# Patient Record
Sex: Male | Born: 1955 | Race: White | Hispanic: No | Marital: Single | Smoking: Current every day smoker
Health system: Southern US, Community
[De-identification: ages and names within clinical notes are randomized; demographics above are authoritative.]

## PROBLEM LIST (undated history)

## (undated) DIAGNOSIS — I10 Essential (primary) hypertension: Secondary | ICD-10-CM

## (undated) DIAGNOSIS — E119 Type 2 diabetes mellitus without complications: Secondary | ICD-10-CM

## (undated) DIAGNOSIS — J449 Chronic obstructive pulmonary disease, unspecified: Secondary | ICD-10-CM

## (undated) DIAGNOSIS — M720 Palmar fascial fibromatosis [Dupuytren]: Secondary | ICD-10-CM

## (undated) DIAGNOSIS — F1010 Alcohol abuse, uncomplicated: Secondary | ICD-10-CM

## (undated) DIAGNOSIS — F1910 Other psychoactive substance abuse, uncomplicated: Secondary | ICD-10-CM

## (undated) DIAGNOSIS — E782 Mixed hyperlipidemia: Secondary | ICD-10-CM

## (undated) DIAGNOSIS — I509 Heart failure, unspecified: Secondary | ICD-10-CM

## (undated) HISTORY — DX: Palmar fascial fibromatosis (dupuytren): M72.0

## (undated) HISTORY — DX: Chronic obstructive pulmonary disease, unspecified: J44.9

## (undated) HISTORY — PX: HAND SURGERY: SHX662

---

## 2007-06-18 ENCOUNTER — Emergency Department: Payer: Self-pay | Admitting: Emergency Medicine

## 2012-03-01 ENCOUNTER — Emergency Department: Payer: Self-pay | Admitting: Emergency Medicine

## 2012-03-01 LAB — BASIC METABOLIC PANEL
Anion Gap: 9 (ref 7–16)
BUN: 5 mg/dL — ABNORMAL LOW (ref 7–18)
Calcium, Total: 9 mg/dL (ref 8.5–10.1)
Chloride: 101 mmol/L (ref 98–107)
Co2: 26 mmol/L (ref 21–32)
EGFR (African American): >60
EGFR (Non-African Amer.): >60
Potassium: 3.2 mmol/L — ABNORMAL LOW (ref 3.5–5.1)
Sodium: 136 mmol/L (ref 136–145)

## 2012-03-01 LAB — CBC
HCT: 48.6 % (ref 40.0–52.0)
MCH: 35.1 pg — ABNORMAL HIGH (ref 26.0–34.0)
Platelet: 331 10*3/uL (ref 150–440)
RBC: 4.75 10*6/uL (ref 4.40–5.90)
RDW: 13.7 % (ref 11.5–14.5)
WBC: 12.3 10*3/uL — ABNORMAL HIGH (ref 3.8–10.6)

## 2013-04-08 ENCOUNTER — Emergency Department: Payer: Self-pay | Admitting: Emergency Medicine

## 2013-04-08 LAB — URINALYSIS, COMPLETE
Bacteria: NONE SEEN
Bilirubin,UR: NEGATIVE
Glucose,UR: NEGATIVE mg/dL (ref 0–75)
Ketone: NEGATIVE
Nitrite: NEGATIVE
Ph: 6 (ref 4.5–8.0)
RBC,UR: NONE SEEN /HPF (ref 0–5)
Specific Gravity: 1.005 (ref 1.003–1.030)
Squamous Epithelial: NONE SEEN

## 2013-04-08 LAB — COMPREHENSIVE METABOLIC PANEL
Albumin: 3.6 g/dL (ref 3.4–5.0)
Bilirubin,Total: 0.4 mg/dL (ref 0.2–1.0)
Calcium, Total: 8.3 mg/dL — ABNORMAL LOW (ref 8.5–10.1)
Chloride: 94 mmol/L — ABNORMAL LOW (ref 98–107)
Co2: 28 mmol/L (ref 21–32)
Creatinine: 0.7 mg/dL (ref 0.60–1.30)
EGFR (Non-African Amer.): >60
SGOT(AST): 30 U/L (ref 15–37)
SGPT (ALT): 28 U/L (ref 12–78)
Sodium: 129 mmol/L — ABNORMAL LOW (ref 136–145)

## 2013-04-08 LAB — CBC
HCT: 49 % (ref 40.0–52.0)
HGB: 16.9 g/dL (ref 13.0–18.0)
MCH: 32.2 pg (ref 26.0–34.0)
RDW: 14.5 % (ref 11.5–14.5)

## 2013-04-08 LAB — ETHANOL: Ethanol: 98 mg/dL

## 2013-04-08 LAB — DRUG SCREEN, URINE
Barbiturates, Ur Screen: NEGATIVE (ref ?–200)
Benzodiazepine, Ur Scrn: NEGATIVE (ref ?–200)
Cannabinoid 50 Ng, Ur ~~LOC~~: NEGATIVE (ref ?–50)
Cocaine Metabolite,Ur ~~LOC~~: NEGATIVE (ref ?–300)
MDMA (Ecstasy)Ur Screen: NEGATIVE (ref ?–500)
Opiate, Ur Screen: POSITIVE (ref ?–300)
Tricyclic, Ur Screen: NEGATIVE (ref ?–1000)

## 2013-04-19 ENCOUNTER — Emergency Department: Payer: Self-pay | Admitting: Emergency Medicine

## 2013-04-19 LAB — COMPREHENSIVE METABOLIC PANEL
Albumin: 3.8 g/dL (ref 3.4–5.0)
Alkaline Phosphatase: 111 U/L (ref 50–136)
Anion Gap: 10 (ref 7–16)
BUN: 6 mg/dL — ABNORMAL LOW (ref 7–18)
Bilirubin,Total: 0.3 mg/dL (ref 0.2–1.0)
EGFR (Non-African Amer.): >60
Osmolality: 261 (ref 275–301)
SGOT(AST): 21 U/L (ref 15–37)
SGPT (ALT): 20 U/L (ref 12–78)
Sodium: 132 mmol/L — ABNORMAL LOW (ref 136–145)
Total Protein: 7.4 g/dL (ref 6.4–8.2)

## 2013-04-19 LAB — DRUG SCREEN, URINE
Barbiturates, Ur Screen: NEGATIVE (ref ?–200)
Benzodiazepine, Ur Scrn: NEGATIVE (ref ?–200)
Cannabinoid 50 Ng, Ur ~~LOC~~: NEGATIVE (ref ?–50)
Cocaine Metabolite,Ur ~~LOC~~: NEGATIVE (ref ?–300)
MDMA (Ecstasy)Ur Screen: NEGATIVE (ref ?–500)
Opiate, Ur Screen: POSITIVE (ref ?–300)
Phencyclidine (PCP) Ur S: NEGATIVE (ref ?–25)
Tricyclic, Ur Screen: NEGATIVE (ref ?–1000)

## 2013-04-19 LAB — ETHANOL: Ethanol %: 0.142 % — ABNORMAL HIGH (ref 0.000–0.080)

## 2013-04-19 LAB — URINALYSIS, COMPLETE
Bilirubin,UR: NEGATIVE
Blood: NEGATIVE
Glucose,UR: NEGATIVE mg/dL (ref 0–75)
Ketone: NEGATIVE
Leukocyte Esterase: NEGATIVE
Nitrite: NEGATIVE
RBC,UR: NONE SEEN /HPF (ref 0–5)
Specific Gravity: 1.004 (ref 1.003–1.030)
Squamous Epithelial: NONE SEEN
WBC UR: <1 /HPF (ref 0–5)

## 2013-04-19 LAB — TSH: Thyroid Stimulating Horm: 2.09 u[IU]/mL

## 2013-04-19 LAB — CBC
HCT: 43.8 % (ref 40.0–52.0)
MCH: 33 pg (ref 26.0–34.0)
MCV: 93 fL (ref 80–100)
RBC: 4.7 10*6/uL (ref 4.40–5.90)
RDW: 14.7 % — ABNORMAL HIGH (ref 11.5–14.5)

## 2013-04-19 LAB — ACETAMINOPHEN LEVEL: Acetaminophen: <2 ug/mL

## 2013-07-05 ENCOUNTER — Ambulatory Visit: Payer: Self-pay

## 2013-10-15 ENCOUNTER — Ambulatory Visit: Payer: Self-pay

## 2015-10-21 ENCOUNTER — Emergency Department
Admission: EM | Admit: 2015-10-21 | Discharge: 2015-10-21 | Payer: Self-pay | Attending: Emergency Medicine | Admitting: Emergency Medicine

## 2015-10-21 ENCOUNTER — Encounter: Payer: Self-pay | Admitting: Emergency Medicine

## 2015-10-21 ENCOUNTER — Emergency Department: Payer: Self-pay

## 2015-10-21 DIAGNOSIS — S62397A Other fracture of fifth metacarpal bone, left hand, initial encounter for closed fracture: Secondary | ICD-10-CM | POA: Insufficient documentation

## 2015-10-21 DIAGNOSIS — S61412A Laceration without foreign body of left hand, initial encounter: Secondary | ICD-10-CM | POA: Insufficient documentation

## 2015-10-21 DIAGNOSIS — IMO0002 Reserved for concepts with insufficient information to code with codable children: Secondary | ICD-10-CM

## 2015-10-21 DIAGNOSIS — Y9389 Activity, other specified: Secondary | ICD-10-CM | POA: Insufficient documentation

## 2015-10-21 DIAGNOSIS — Y998 Other external cause status: Secondary | ICD-10-CM | POA: Insufficient documentation

## 2015-10-21 DIAGNOSIS — W231XXA Caught, crushed, jammed, or pinched between stationary objects, initial encounter: Secondary | ICD-10-CM | POA: Insufficient documentation

## 2015-10-21 DIAGNOSIS — S62309A Unspecified fracture of unspecified metacarpal bone, initial encounter for closed fracture: Secondary | ICD-10-CM

## 2015-10-21 DIAGNOSIS — F17200 Nicotine dependence, unspecified, uncomplicated: Secondary | ICD-10-CM | POA: Insufficient documentation

## 2015-10-21 DIAGNOSIS — Y92148 Other place in prison as the place of occurrence of the external cause: Secondary | ICD-10-CM | POA: Insufficient documentation

## 2015-10-21 DIAGNOSIS — Z23 Encounter for immunization: Secondary | ICD-10-CM | POA: Insufficient documentation

## 2015-10-21 HISTORY — DX: Other psychoactive substance abuse, uncomplicated: F19.10

## 2015-10-21 MED ORDER — BACITRACIN ZINC 500 UNIT/GM EX OINT
1.0000 "application " | TOPICAL_OINTMENT | Freq: Two times a day (BID) | CUTANEOUS | Status: DC
  Administered 2015-10-21: 1 via TOPICAL

## 2015-10-21 MED ORDER — TETANUS-DIPHTH-ACELL PERTUSSIS 5-2.5-18.5 LF-MCG/0.5 IM SUSP
0.5000 mL | Freq: Once | INTRAMUSCULAR | Status: AC
  Administered 2015-10-21: 0.5 mL via INTRAMUSCULAR

## 2015-10-21 MED ORDER — TRAMADOL HCL 50 MG PO TABS
50.0000 mg | ORAL_TABLET | Freq: Once | ORAL | Status: AC
Start: 2015-10-21 — End: 2015-10-21
  Administered 2015-10-21: 50 mg via ORAL
  Filled 2015-10-21: qty 1

## 2015-10-21 MED ORDER — LIDOCAINE HCL (PF) 1 % IJ SOLN
INTRAMUSCULAR | Status: AC
  Filled 2015-10-21: qty 5

## 2015-10-21 NOTE — ED Notes (Signed)
pts left hand got smashed in jail door when door was opening

## 2015-10-21 NOTE — ED Notes (Signed)
[facility]

## 2015-10-21 NOTE — ED Provider Notes (Signed)
[facility] Emergency Department Provider Note ____________________________________________  Time seen: Approximately 1:19 PM  I have reviewed the triage vital signs and the nursing notes.   HISTORY  Chief Complaint Laceration  HPI Brandon Bolton is a 59 y.o. male who presents to the emergency department for evaluation of left hand pain and laceration. He was holding onto a bar on the jail dooras it was opening and his hand got caught. He has a laceration to the top of the left hand.   Past Medical History  Diagnosis Date  . Drug abuse     There are no active problems to display for this patient.   Past Surgical History  Procedure Laterality Date  . Hand surgery      No current outpatient prescriptions on file.  Allergies Review of patient's allergies indicates no known allergies.  No family history on file.  Social History Social History  Substance Use Topics  . Smoking status: Current Every Day Smoker  . Smokeless tobacco: Not on file  . Alcohol Use: Yes    Review of Systems Constitutional: No recent illness. Eyes: No visual changes. ENT: No sore throat. Cardiovascular: Denies chest pain or palpitations. Respiratory: Denies shortness of breath. Gastrointestinal: No abdominal pain.  Genitourinary: Negative for dysuria. Musculoskeletal: Pain in left hand Skin: Negative for rash. Neurological: Negative for headaches, focal weakness or numbness. 10-point ROS otherwise negative.  ____________________________________________   PHYSICAL EXAM:  VITAL SIGNS: ED Triage Vitals  Enc Vitals Group     BP 10/21/15 1126 169/104 mmHg     Pulse Rate 10/21/15 1126 94     Resp 10/21/15 1126 16     Temp 10/21/15 1126 97.8 F (36.6 C)     Temp Source 10/21/15 1126 Oral     SpO2 10/21/15 1126 98 %     Weight 10/21/15 1126 140 lb (63.504 kg)     Height 10/21/15 1126 5\' 9"  (1.753 m)     Head Cir --      Peak Flow --      Pain Score 10/21/15  1126 8     Pain Loc --      Pain Edu? --      Excl. in GC? --     Constitutional: Alert and oriented. Well appearing and in no acute distress. Eyes: Conjunctivae are normal. EOMI. Head: Atraumatic. Nose: No congestion/rhinnorhea. Neck: No stridor.  Respiratory: Normal respiratory effort.   Musculoskeletal: Tenderness over the dorsal aspect of the left hand with a 3.5 cm laceration. Neurologic:  Normal speech and language. No gross focal neurologic deficits are appreciated. Speech is normal. No gait instability. Skin:  Skin is warm, dry and intact. Atraumatic. Psychiatric: Mood and affect are normal. Speech and behavior are normal.  ____________________________________________   LABS (all labs ordered are listed, but only abnormal results are displayed)  Labs Reviewed - No data to display ____________________________________________  RADIOLOGY  Fracture of the left distal fifth metacarpal with mild angulation. ____________________________________________   PROCEDURES  Procedure(s) performed:  LACERATION REPAIR Performed by: Kem Boroughsari Nance Mccombs Authorized by: Kem Boroughsari Chanler Mendonca Consent: Verbal consent obtained. Risks and benefits: risks, benefits and alternatives were discussed Consent given by: patient Patient identity confirmed: provided demographic data Prepped and Draped in normal sterile fashion Wound explored  Laceration Location: dorsal aspect of the left hand  Laceration Length: 3.5cm  No Foreign Bodies seen or palpated  Anesthesia: local infiltration  Local anesthetic: lidocaine 1% without epinephrine  Anesthetic total: 3 ml  Irrigation method:  syringe  Amount of cleaning: standard  Skin closure: 5-0 Nylon  Number of sutures: 5  Technique: simple interrupted  Patient tolerance: Patient tolerated the procedure well with no immediate complications.  Bacitracin dressing applied by RN.  SPLINT APPLICATION Authorized by: Kem Boroughs Consent: Verbal  consent obtained. Risks and benefits: risks, benefits and alternatives were discussed Consent given by: patient Splint applied by: Vincenza Hews, ER technician Location details: left hand Splint type: Boxer's Volar Supplies used: OCL and ACE Post-procedure: The splinted body part was neurovascularly unchanged following the procedure. Patient tolerance: Patient tolerated the procedure well with no immediate complications.       ____________________________________________   INITIAL IMPRESSION / ASSESSMENT AND PLAN / ED COURSE  Pertinent labs & imaging results that were available during my care of the patient were reviewed by me and considered in my medical decision making (see chart for details).  Site of laceration not consistent with the site of the metacarpal fracture. No concern for open fracture.  Patient and officer were advised that he will need to have the sutures removed in 10 days. They were also advised that he will need to follow-up with orthopedics. They were advised to return to the emergency department for symptoms that are of concern if they're unable to schedule an appointment elsewhere. ____________________________________________   FINAL CLINICAL IMPRESSION(S) / ED DIAGNOSES  Final diagnoses:  Laceration  Metacarpal bone fracture, closed, initial encounter       Chinita Pester, FNP 10/21/15 1327  Emily Filbert, MD 10/21/15 [phone]

## 2015-10-21 NOTE — ED Notes (Signed)
No sx rxn to tdap. Ok pt to leave with tdap.

## 2016-08-04 ENCOUNTER — Ambulatory Visit: Payer: Self-pay | Admitting: Urology

## 2016-08-04 VITALS — BP 147/76 | HR 87 | Ht 68.0 in | Wt 148.0 lb

## 2016-08-04 DIAGNOSIS — J410 Simple chronic bronchitis: Secondary | ICD-10-CM

## 2016-08-04 DIAGNOSIS — M720 Palmar fascial fibromatosis [Dupuytren]: Secondary | ICD-10-CM

## 2016-08-04 MED ORDER — ALBUTEROL SULFATE HFA 108 (90 BASE) MCG/ACT IN AERS: 2.0000 | INHALATION_SPRAY | Freq: Four times a day (QID) | RESPIRATORY_TRACT | 4 refills | Status: DC | PRN

## 2016-08-04 MED ORDER — MOMETASONE FURO-FORMOTEROL FUM 200-5 MCG/ACT IN AERO: 2.0000 | INHALATION_SPRAY | Freq: Two times a day (BID) | RESPIRATORY_TRACT | 12 refills | Status: DC

## 2016-08-04 MED ORDER — MELOXICAM 15 MG PO TABS: 15.0000 mg | ORAL_TABLET | Freq: Every day | ORAL | 12 refills | Status: DC

## 2016-08-04 NOTE — Progress Notes (Signed)
  Patient: Brandon Bolton Male    DOB: 11-Aug-1956   60 y.o.   MRN: [id number] Visit Date: 08/04/2016  Today's Provider: [facility]   Chief Complaint  Patient presents with  . Follow-up     medication refills   Subjective:    HPI Patient is a 60 year old Caucasian with COPD who is out of his medication.  Ran out of the Proventil a long time ago.  Stretching his Dulera.  He just ran out of the [place].  Was not having good control of symptoms with his current dose of Dulera.    Dupuytren contractions in both hands.    No recent labs.    No Known Allergies Previous Medications   ACETAMINOPHEN (TYLENOL) 500 MG TABLET    Take 500 mg by mouth every 6 (six) hours as needed.   SULINDAC (CLINORIL) 150 MG TABLET    Take 150 mg by mouth 2 (two) times daily.    Review of Systems  Social History  Substance Use Topics  . Smoking status: Current Every Day Smoker    Packs/day: 0.50    Years: 30.00    Types: Cigarettes  . Smokeless tobacco: Never Used  . Alcohol use Yes     Comment: 2 years sober, normal previously 4-6 beers every other day   Objective:   BP (!) 147/76   Pulse 87   Ht 5\' 8"  (1.727 m)   Wt 148 lb (67.1 kg)   BMI 22.50 kg/m   Physical Exam Constitutional: Well nourished. Alert and oriented, No acute distress. HEENT: [place] AT, moist mucus membranes. Trachea midline, no masses. Cardiovascular: No clubbing, cyanosis, or edema. Respiratory: Normal respiratory effort, no increased work of breathing. Skin: No rashes, bruises or suspicious lesions. Lymph: No cervical or inguinal adenopathy. Neurologic: Grossly intact, no focal deficits, moving all 4 extremities. Psychiatric: Normal mood and affect. Both hands with severe dupuytren's contractures of for the exception of index fingers and thumbs     Assessment & Plan:  1. COPD  - meds are refilled  - recheck in 2 weeks  2. Dupuytren contractions  - will contact [employer] rep in the am to see if there is a  PAP  - check HbgA1c  - apply for charity care to see a hand surgeon  - Mobic for pain  Will need labs drawn, CMP, TSH, HbgA1c, Lipids, CBC and homocystine levels       [facility]   [facility] [place] [place]

## 2016-08-11 ENCOUNTER — Telehealth: Payer: Self-pay

## 2016-08-11 NOTE — Telephone Encounter (Signed)
-----   Message from Skipper ClicheKeri K Harrison, [facility] sent at 08/10/2016  3:26 PM EDT ----- Mr. Rosalia HammersRay was prescribed Dulera 200/5- 2 puffs twice daily for COPD. This will need to be ordered through PAP. [facility] does not have any samples.  We do have samples of Symbicort 80/4.5 mcg.  Recommend Symbicort 80/4.5mcg- 2 puffs twice daily (use until [facility]), #1, 2 refills. Thank you Keri

## 2016-08-16 ENCOUNTER — Ambulatory Visit: Payer: Self-pay | Admitting: Pharmacy Technician

## 2016-08-16 DIAGNOSIS — Z79899 Other long term (current) drug therapy: Secondary | ICD-10-CM

## 2016-08-16 NOTE — Progress Notes (Signed)
Completed [facility] application and contract.  Patient agreed to all terms of the [facility] contract.  Provided patient with information about [employer] [place] and other Community education officercommunity resource material based on his particular needs.    Dulera & Ventolin Prescription Applications completed with patient.  Forwarded to [facility] for signature.  Upon receipt of signed application from provider, Elwin Sleightulera & Ventolin Prescription Applications will be submitted to [facility] [employer].  Sherilyn DacostaBetty J. Akeel Reffner Care Manager [facility]

## 2016-08-25 ENCOUNTER — Ambulatory Visit: Payer: Self-pay | Admitting: Nurse Practitioner

## 2016-08-25 VITALS — BP 142/74 | HR 87 | Ht 68.0 in | Wt 148.0 lb

## 2016-08-25 DIAGNOSIS — Z23 Encounter for immunization: Secondary | ICD-10-CM

## 2016-08-25 DIAGNOSIS — J438 Other emphysema: Secondary | ICD-10-CM

## 2016-08-25 DIAGNOSIS — E119 Type 2 diabetes mellitus without complications: Secondary | ICD-10-CM

## 2016-08-25 DIAGNOSIS — J410 Simple chronic bronchitis: Secondary | ICD-10-CM

## 2016-08-25 LAB — GLUCOSE, POCT (MANUAL RESULT ENTRY): POC GLUCOSE: 93 mg/dL (ref 70–99)

## 2016-08-25 MED ORDER — ALBUTEROL SULFATE HFA 108 (90 BASE) MCG/ACT IN AERS: 2.0000 | INHALATION_SPRAY | Freq: Four times a day (QID) | RESPIRATORY_TRACT | 4 refills | Status: DC | PRN

## 2016-08-25 MED ORDER — MOMETASONE FURO-FORMOTEROL FUM 200-5 MCG/ACT IN AERO: 2.0000 | INHALATION_SPRAY | Freq: Two times a day (BID) | RESPIRATORY_TRACT | 12 refills | Status: DC

## 2016-08-25 MED ORDER — MOMETASONE FURO-FORMOTEROL FUM 200-5 MCG/ACT IN AERO: 2.0000 | INHALATION_SPRAY | Freq: Two times a day (BID) | RESPIRATORY_TRACT | Status: DC

## 2016-08-25 NOTE — Addendum Note (Signed)
Addended by: Dustin FlockMAYNOR, VICKIE D on: 08/25/2016 08:53 PM   Modules accepted: Orders

## 2016-08-25 NOTE — Addendum Note (Signed)
Addended by: Dustin FlockMAYNOR, VICKIE D on: 08/25/2016 08:51 PM   Modules accepted: Orders

## 2016-08-25 NOTE — Progress Notes (Signed)
Has not been to clinic for several years, so tonight labs drawn  Continues to smoke 1 ppd,   [facility], no addiction issues  Was in prison for 8 months, was released in August 2017  Major concerns, ? Possibility of diabetes  Concerned re:  Lower extremity circulation ? Of intermittent claudication C/O of muscle cramps ? Hanging of feet off edge of bed Pain and tingling in his feet   Also has severe Dupuytren's contractures involving both hands, present x 17 years, has a very hard time using his hands  Unable to work, has applied for disability  COPD needs his inhalers, requesting dulera  Last eye exam done 6 months ago  EXAM: ALERT, VERBALLY APPROPRIATE IN NO ACUTE DISTRESS BBrS MARKEDLY DECREASES AS ANTICIPATED IN A HEAVY SMOKER CAROTID BRUITS NOT PRESENT R SOFT SUBMANDIBULAR GLAND PT PULSES EASILY PALPABLE, NO LOWER EXTREMITY EDEMA  TEETH IN POOR REPAIR  MARKED CONTRACTURES IN BOTH HANDS    ASSESSMENT/PLAN: Labs were drawn tonight, will bring back to discuss results  Will renew his inhalers  Will refer to ortho for his hands  Will refer to dentistry for eval of his teeth  Flu shot done

## 2016-08-27 LAB — LIPID PANEL
CHOLESTEROL TOTAL: 230 mg/dL — AB (ref 100–199)
Chol/HDL Ratio: 4.8 ratio units (ref 0.0–5.0)
HDL: 48 mg/dL (ref >39)
LDL Calculated: 146 mg/dL — ABNORMAL HIGH (ref 0–99)
Triglycerides: 181 mg/dL — ABNORMAL HIGH (ref 0–149)
VLDL CHOLESTEROL CAL: 36 mg/dL (ref 5–40)

## 2016-08-27 LAB — COMPREHENSIVE METABOLIC PANEL
A/G RATIO: 1.8 (ref 1.2–2.2)
ALT: 9 IU/L (ref 0–44)
AST: 13 IU/L (ref 0–40)
Albumin: 4.2 g/dL (ref 3.5–5.5)
Alkaline Phosphatase: 59 IU/L (ref 39–117)
BUN/Creatinine Ratio: 13 (ref 9–20)
BUN: 15 mg/dL (ref 6–24)
Bilirubin Total: <0.2 mg/dL (ref 0.0–1.2)
CALCIUM: 9.3 mg/dL (ref 8.7–10.2)
CO2: 27 mmol/L (ref 18–29)
Chloride: 98 mmol/L (ref 96–106)
Creatinine, Ser: 1.14 mg/dL (ref 0.76–1.27)
GFR, EST AFRICAN AMERICAN: 81 mL/min/{1.73_m2} (ref >59)
GFR, EST NON AFRICAN AMERICAN: 70 mL/min/{1.73_m2} (ref >59)
GLOBULIN, TOTAL: 2.3 g/dL (ref 1.5–4.5)
Glucose: 89 mg/dL (ref 65–99)
POTASSIUM: 4.3 mmol/L (ref 3.5–5.2)
Sodium: 138 mmol/L (ref 134–144)
TOTAL PROTEIN: 6.5 g/dL (ref 6.0–8.5)

## 2016-08-27 LAB — CBC WITH DIFFERENTIAL/PLATELET
BASOS: 1 %
Basophils Absolute: 0 10*3/uL (ref 0.0–0.2)
EOS (ABSOLUTE): 0.2 10*3/uL (ref 0.0–0.4)
Eos: 3 %
HEMATOCRIT: 40.2 % (ref 37.5–51.0)
HEMOGLOBIN: 14.2 g/dL (ref 12.6–17.7)
IMMATURE GRANS (ABS): 0 10*3/uL (ref 0.0–0.1)
Immature Granulocytes: 0 %
LYMPHS: 42 %
Lymphocytes Absolute: 2.8 10*3/uL (ref 0.7–3.1)
MCH: 32.6 pg (ref 26.6–33.0)
MCHC: 35.3 g/dL (ref 31.5–35.7)
MCV: 92 fL (ref 79–97)
Monocytes Absolute: 0.7 10*3/uL (ref 0.1–0.9)
Monocytes: 11 %
NEUTROS ABS: 2.8 10*3/uL (ref 1.4–7.0)
Neutrophils: 43 %
PLATELETS: 266 10*3/uL (ref 150–379)
RBC: 4.35 x10E6/uL (ref 4.14–5.80)
RDW: 15.1 % (ref 12.3–15.4)
WBC: 6.5 10*3/uL (ref 3.4–10.8)

## 2016-08-27 LAB — TSH: TSH: 2.98 u[IU]/mL (ref 0.450–4.500)

## 2016-08-27 LAB — HEMOGLOBIN A1C
ESTIMATED AVERAGE GLUCOSE: 114 mg/dL
Hgb A1c MFr Bld: 5.6 % (ref 4.8–5.6)

## 2016-08-27 LAB — HOMOCYSTEINE: Homocysteine: 11.7 umol/L (ref 0.0–15.0)

## 2016-08-31 ENCOUNTER — Encounter: Payer: Self-pay | Admitting: Specialist

## 2016-09-06 ENCOUNTER — Ambulatory Visit: Payer: Self-pay | Admitting: Adult Health Nurse Practitioner

## 2016-09-06 ENCOUNTER — Other Ambulatory Visit: Payer: Self-pay | Admitting: Adult Health Nurse Practitioner

## 2016-09-06 VITALS — BP 160/88 | HR 83 | Temp 98.3°F | Wt 145.5 lb

## 2016-09-06 DIAGNOSIS — E119 Type 2 diabetes mellitus without complications: Secondary | ICD-10-CM

## 2016-09-06 DIAGNOSIS — J410 Simple chronic bronchitis: Secondary | ICD-10-CM

## 2016-09-06 DIAGNOSIS — E782 Mixed hyperlipidemia: Secondary | ICD-10-CM | POA: Insufficient documentation

## 2016-09-06 DIAGNOSIS — J438 Other emphysema: Secondary | ICD-10-CM

## 2016-09-06 DIAGNOSIS — J449 Chronic obstructive pulmonary disease, unspecified: Secondary | ICD-10-CM | POA: Insufficient documentation

## 2016-09-06 DIAGNOSIS — Z23 Encounter for immunization: Secondary | ICD-10-CM

## 2016-09-06 MED ORDER — ALBUTEROL SULFATE HFA 108 (90 BASE) MCG/ACT IN AERS: 2.0000 | INHALATION_SPRAY | Freq: Four times a day (QID) | RESPIRATORY_TRACT | 4 refills | Status: DC | PRN

## 2016-09-06 MED ORDER — BUDESONIDE-FORMOTEROL FUMARATE 160-4.5 MCG/ACT IN AERO: 2.0000 | INHALATION_SPRAY | Freq: Two times a day (BID) | RESPIRATORY_TRACT | 3 refills | Status: DC

## 2016-09-06 MED ORDER — ATORVASTATIN CALCIUM 10 MG PO TABS: 10.0000 mg | ORAL_TABLET | Freq: Every day | ORAL | 3 refills | Status: DC

## 2016-09-06 MED ORDER — MELOXICAM 15 MG PO TABS: 15.0000 mg | ORAL_TABLET | Freq: Every day | ORAL | 2 refills | Status: DC

## 2016-09-06 NOTE — Progress Notes (Signed)
Patient: Brandon Bolton Male    DOB: 1956/10/11   60 y.o.   MRN: [id number] Visit Date: 09/06/2016  Today's Provider: Jacelyn Pieah Doles-Johnson, NP   Chief Complaint  Patient presents with  . Follow-up    blood work, also needs [facility] to send bill to [place] with orthopedist   . URI    Cold since friday, sore throat, congestion, cough, runny nose  . Foot Pain    burning pain at rest, constant, feels like foot is cold   Subjective:    HPI  S[place]o for his hands[place]     Labs reviewed all WNL except lipid panel.  Has never been told he had HLD-never been on medications.   BP continues to be elevated at this visit. Last visit 142-this visit 160 SBP Repeat BP 138/78. Denies HA, dizziness and chest pain.   Reports that he is on Symbicort- [facility] not have the [facility].  Using albuterol twice daily as well.   No Known Allergies Previous Medications   ACETAMINOPHEN (TYLENOL) 500 MG TABLET    Take 500 mg by mouth every 6 (six) hours as needed.   ALBUTEROL (PROVENTIL HFA;VENTOLIN HFA) 108 (90 BASE) MCG/ACT INHALER    Inhale 2 puffs into the lungs every 6 (six) hours as needed for wheezing or shortness of breath.   MELOXICAM (MOBIC) 15 MG TABLET    Take 1 tablet (15 mg total) by mouth daily.   MOMETASONE-FORMOTEROL (DULERA) 200-5 MCG/ACT AERO    Inhale 2 puffs into the lungs 2 (two) times daily.   SULINDAC (CLINORIL) 150 MG TABLET    Take 150 mg by mouth 2 (two) times daily.    Review of Systems  All other systems reviewed and are negative.   Social History  Substance Use Topics  . Smoking status: Current Every Day Smoker    Packs/day: 1.00    Years: 40.00    Types: Cigarettes  . Smokeless tobacco: Never Used  . Alcohol use No     Comment: 2 years sober, normal previously 4-6 beers every other day   Objective:   BP (!) 160/88   Pulse 83   Temp 98.3 F (36.8 C)   Wt 145 lb 8 oz (66 kg)   SpO2 94%   BMI 22.12 kg/m   Physical Exam  Constitutional: He is  oriented to person, place, and time. He appears well-developed and well-nourished.  HENT:  Head: Normocephalic and atraumatic.  Eyes: Pupils are equal, round, and reactive to light.  Neck: Normal range of motion. Neck supple.  Cardiovascular: Normal rate, regular rhythm and normal heart sounds.   Pulmonary/Chest: Effort normal. He has wheezes.  Abdominal: Soft. Bowel sounds are normal.  Neurological: He is alert and oriented to person, place, and time.  Skin: Skin is warm and dry.  Psychiatric: He has a normal mood and affect. His behavior is normal.  Vitals reviewed.       Assessment & Plan:         HLD:  Will start Atorvastatin 10mg  nightly.  Monitor diet for foods high in cholesterol.  FU for LFT in 1 month.   COPD:  Continue Symbicort-bring to next OV. If on the low dose consider increase in dosage.  Use Albuterol as needed.  Encouraged smoking cessation.  Supportive care for cold.  FU precautions given.   [facility] [place] [facility] [place] [facility] [place] contractures.   Jacelyn Pieah Doles-Johnson, NP   [facility]

## 2016-09-27 ENCOUNTER — Other Ambulatory Visit: Payer: Self-pay

## 2016-09-27 DIAGNOSIS — E782 Mixed hyperlipidemia: Secondary | ICD-10-CM

## 2016-09-28 LAB — COMPREHENSIVE METABOLIC PANEL
ALK PHOS: 71 IU/L (ref 39–117)
ALT: 16 IU/L (ref 0–44)
AST: 15 IU/L (ref 0–40)
Albumin/Globulin Ratio: 2.4 — ABNORMAL HIGH (ref 1.2–2.2)
Albumin: 4.7 g/dL (ref 3.5–5.5)
BUN/Creatinine Ratio: 13 (ref 9–20)
BUN: 14 mg/dL (ref 6–24)
CHLORIDE: 98 mmol/L (ref 96–106)
CO2: 23 mmol/L (ref 18–29)
Calcium: 9.4 mg/dL (ref 8.7–10.2)
Creatinine, Ser: 1.05 mg/dL (ref 0.76–1.27)
GFR calc Af Amer: 89 mL/min/{1.73_m2} (ref >59)
GFR calc non Af Amer: 77 mL/min/{1.73_m2} (ref >59)
GLUCOSE: 112 mg/dL — AB (ref 65–99)
Globulin, Total: 2 g/dL (ref 1.5–4.5)
Potassium: 5.5 mmol/L — ABNORMAL HIGH (ref 3.5–5.2)
Sodium: 139 mmol/L (ref 134–144)
Total Protein: 6.7 g/dL (ref 6.0–8.5)

## 2016-09-30 ENCOUNTER — Telehealth: Payer: Self-pay

## 2016-09-30 NOTE — Telephone Encounter (Signed)
-----   Message from Jacelyn Pieah Doles-Johnson, NP sent at 09/29/2016  8:00 PM EST ----- Stable. Potassium slightly high, do not eat any high potassium foods such as raisins or bananas for 2 weeks and stop any multivitamins or supplements for 2 weeks.

## 2016-09-30 NOTE — Telephone Encounter (Signed)
Called pt to give results. No answer. Left message.

## 2016-10-04 ENCOUNTER — Ambulatory Visit: Payer: Self-pay | Admitting: Adult Health Nurse Practitioner

## 2016-10-04 VITALS — BP 158/86 | HR 104 | Ht 68.0 in | Wt 146.0 lb

## 2016-10-04 DIAGNOSIS — E782 Mixed hyperlipidemia: Secondary | ICD-10-CM

## 2016-10-04 DIAGNOSIS — I10 Essential (primary) hypertension: Secondary | ICD-10-CM | POA: Insufficient documentation

## 2016-10-04 DIAGNOSIS — Z0000 Encounter for general adult medical examination without abnormal findings: Secondary | ICD-10-CM

## 2016-10-04 MED ORDER — LISINOPRIL 10 MG PO TABS: 10.0000 mg | ORAL_TABLET | Freq: Every day | ORAL | 2 refills | Status: DC

## 2016-10-04 NOTE — Progress Notes (Signed)
  Patient: Brandon Bolton Male    DOB: 07/18/1956   60 y.o.   MRN: [id number] Visit Date: 10/04/2016  Today's Provider: [facility]   Chief Complaint  Patient presents with  . Follow-up    lab work   Subjective:    HPI     Started on Lipitor last visit.  Taking as directed.  Denies any myalgias.   BP remains high today.  Last visit BP 160/88.  States that he has had one occurrence of inability to urinate. Pt thinks it is related to his nerves. Denies hematuria or dysuria.  Denies abdominal pain.  Not sexually active.  Denies dribbling or hesitancy.  Reports strong stream.    No Known Allergies Previous Medications   ACETAMINOPHEN (TYLENOL) 500 MG TABLET    Take 500 mg by mouth every 6 (six) hours as needed.   ALBUTEROL (PROVENTIL HFA;VENTOLIN HFA) 108 (90 BASE) MCG/ACT INHALER    Inhale 2 puffs into the lungs every 6 (six) hours as needed for wheezing or shortness of breath.   ATORVASTATIN (LIPITOR) 10 MG TABLET    Take 1 tablet (10 mg total) by mouth daily.   BUDESONIDE-FORMOTEROL (SYMBICORT) 160-4.5 MCG/ACT INHALER    Inhale 2 puffs into the lungs 2 (two) times daily.   MELOXICAM (MOBIC) 15 MG TABLET    Take 1 tablet (15 mg total) by mouth daily.   SULINDAC (CLINORIL) 150 MG TABLET    Take 150 mg by mouth 2 (two) times daily.    Review of Systems  All other systems reviewed and are negative.   Social History  Substance Use Topics  . Smoking status: Current Every Day Smoker    Packs/day: 1.00    Years: 40.00    Types: Cigarettes  . Smokeless tobacco: Never Used  . 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 He appears well-developed and well-nourished.  HENT:  Head: Normocephalic and  atraumatic.  Eyes: Pupils are equal, round, and reactive to light.  Neck: Normal range of motion. Neck supple.  Cardiovascular: Normal rate, regular rhythm and normal heart sounds.   Pulmonary/Chest: Effort normal and breath sounds normal.  Abdominal: Soft. Bowel sounds are normal.  Neurological: He is alert and oriented to person, place, and time.  Skin: Skin is warm and dry.  Psychiatric: He has a normal mood and affect.  Vitals reviewed.       Assessment & Plan:         HLD:  Continue current regimen.  Encourage low cholesterol, low fat diet and exercise.  Check LFTs in 4 weeks.   HTN:  New diagnosis.  Start lisinopril 10mg  daily.  Return in 4 weeks for BP check and BMP. Encourage low sodium diet.    Pt states that he still does not have Symbicort due to paperwork from [facility]. Given Advair 250/50 sample to use in the meantime.  FU on Symbicort at next ov.   Check PSA.   [facility]   [facility] [place]

## 2016-10-05 ENCOUNTER — Telehealth: Payer: Self-pay

## 2016-10-05 NOTE — Telephone Encounter (Signed)
This employee was informed on 10/05/16 that there would be no provider for the 10/06/16 eye clinic so patients would need to be rescheduled.  Patient originally slated for an eye appointment on 10/06/16 at 2pm.  Called and left message to make aware that appointment on 12/7 was cancelled and for him to call [facility] back to reschedule appointment at a later time.

## 2016-10-06 ENCOUNTER — Ambulatory Visit: Payer: Self-pay | Admitting: Ophthalmology

## 2016-10-06 ENCOUNTER — Telehealth: Payer: Self-pay | Admitting: Nurse Practitioner

## 2016-10-06 NOTE — Telephone Encounter (Signed)
Called to reschedule patient's eye appointment

## 2016-10-07 ENCOUNTER — Telehealth: Payer: Self-pay

## 2016-10-07 NOTE — Telephone Encounter (Signed)
Called pt to go over results from doctor. Left msg.

## 2016-10-07 NOTE — Telephone Encounter (Signed)
PT called back and gave results. Pt verbalized understanding.

## 2016-10-07 NOTE — Telephone Encounter (Signed)
-----   Message from Teah Doles-Johnson, NP sent at 09/29/2016  8:00 PM EST ----- Stable. Potassium slightly high, do not eat any high potassium foods such as raisins or bananas for 2 weeks and stop any multivitamins or supplements for 2 weeks. 

## 2016-10-13 ENCOUNTER — Ambulatory Visit: Payer: Self-pay | Admitting: Ophthalmology

## 2016-10-27 ENCOUNTER — Other Ambulatory Visit: Payer: Self-pay

## 2016-10-27 DIAGNOSIS — Z0000 Encounter for general adult medical examination without abnormal findings: Secondary | ICD-10-CM

## 2016-10-28 LAB — COMPREHENSIVE METABOLIC PANEL
ALK PHOS: 64 IU/L (ref 39–117)
ALT: 16 IU/L (ref 0–44)
AST: 17 IU/L (ref 0–40)
Albumin/Globulin Ratio: 2.2 (ref 1.2–2.2)
Albumin: 4.4 g/dL (ref 3.5–5.5)
BUN / CREAT RATIO: 8 — AB (ref 9–20)
BUN: 11 mg/dL (ref 6–24)
CHLORIDE: 97 mmol/L (ref 96–106)
CO2: 23 mmol/L (ref 18–29)
Calcium: 9 mg/dL (ref 8.7–10.2)
Creatinine, Ser: 1.34 mg/dL — ABNORMAL HIGH (ref 0.76–1.27)
GFR calc Af Amer: 67 mL/min/{1.73_m2} (ref >59)
GFR calc non Af Amer: 58 mL/min/{1.73_m2} — ABNORMAL LOW (ref >59)
GLUCOSE: 97 mg/dL (ref 65–99)
Globulin, Total: 2 g/dL (ref 1.5–4.5)
Potassium: 3.7 mmol/L (ref 3.5–5.2)
Sodium: 139 mmol/L (ref 134–144)
Total Protein: 6.4 g/dL (ref 6.0–8.5)

## 2016-10-28 LAB — PSA: PROSTATE SPECIFIC AG, SERUM: 0.4 ng/mL (ref 0.0–4.0)

## 2016-10-31 DIAGNOSIS — F1010 Alcohol abuse, uncomplicated: Secondary | ICD-10-CM

## 2016-10-31 HISTORY — DX: Alcohol abuse, uncomplicated: F10.10

## 2016-11-01 ENCOUNTER — Ambulatory Visit: Payer: Self-pay

## 2016-11-03 ENCOUNTER — Ambulatory Visit: Payer: Self-pay | Admitting: Adult Health Nurse Practitioner

## 2016-11-03 VITALS — BP 169/75 | HR 96 | Temp 97.8°F | Wt 147.0 lb

## 2016-11-03 DIAGNOSIS — I10 Essential (primary) hypertension: Secondary | ICD-10-CM

## 2016-11-03 MED ORDER — AMLODIPINE BESYLATE 5 MG PO TABS: 5.0000 mg | ORAL_TABLET | Freq: Every day | ORAL | 1 refills | Status: DC

## 2016-11-03 NOTE — Progress Notes (Signed)
  Patient: Brandon Bolton Male    DOB: [birth year]   61 y.o.   MRN: [id number] Visit Date: 11/03/2016  Today's Provider: [facility]   Chief Complaint  Patient presents with  . Follow-up    labs done last week   . Hand Pain   Subjective:    HPI     HTN:  Diagnosed at last visit.  Started on lisinopril 10mg - Crt elevated to 1.34.  Still elevated today.    Pt states he is getting cramps in his hands.  Cannot tell if meloxicam is helping- states it helps some. Has been having hand cramps for years secondary to Dupuytren's contractures.  States he is [facility] care.   No Known Allergies Previous Medications   ACETAMINOPHEN (TYLENOL) 500 MG TABLET    Take 500 mg by mouth every 6 (six) hours as needed.   ALBUTEROL (PROVENTIL HFA;VENTOLIN HFA) 108 (90 BASE) MCG/ACT INHALER    Inhale 2 puffs into the lungs every 6 (six) hours as needed for wheezing or shortness of breath.   ATORVASTATIN (LIPITOR) 10 MG TABLET    Take 1 tablet (10 mg total) by mouth daily.   BUDESONIDE-FORMOTEROL (SYMBICORT) 160-4.5 MCG/ACT INHALER    Inhale 2 puffs into the lungs 2 (two) times daily.   LISINOPRIL (PRINIVIL,ZESTRIL) 10 MG TABLET    Take 1 tablet (10 mg total) by mouth daily.   MELOXICAM (MOBIC) 15 MG TABLET    Take 1 tablet (15 mg total) by mouth daily.   SULINDAC (CLINORIL) 150 MG TABLET    Take 150 mg by mouth 2 (two) times daily.    Review of Systems  All other systems reviewed and are negative.   Social History  Substance Use Topics  . Smoking status: Current Every Day Smoker    Packs/day: 1.00    Years: 40.00    Types: Cigarettes  . Smokeless tobacco: Never Used  . 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 He  appears well-developed and well-nourished.  HENT:  Head: Normocephalic and atraumatic.  Eyes: Conjunctivae are normal. Pupils are equal, round, and reactive to light.  Neck: Normal range of motion. Neck supple.  Cardiovascular: Normal rate, regular rhythm and normal heart sounds.   Pulmonary/Chest: Effort normal and breath sounds normal.  Neurological: He is alert and oriented to person, place, and time.  Skin: Skin is warm and dry.  Vitals reviewed.       Assessment & Plan:     HTN:  Not controlled.  Goal BP <130/80.  Encourage low salt diet and exercise.  Stop lisinopril 10mg  daily.  Start Norvasc 5mg  daily.  FU in 2 weeks for BP check.  Repeat BMP at next visit for creatinine.    [facility] for Dupuytren's.  Continue meloxicam.        [facility]   [facility] [place]

## 2016-11-10 ENCOUNTER — Other Ambulatory Visit: Payer: Self-pay

## 2016-11-10 DIAGNOSIS — R7989 Other specified abnormal findings of blood chemistry: Secondary | ICD-10-CM

## 2016-11-11 LAB — BASIC METABOLIC PANEL
BUN/Creatinine Ratio: 10 (ref 9–20)
BUN: 19 mg/dL (ref 6–24)
CALCIUM: 9.5 mg/dL (ref 8.7–10.2)
CO2: 25 mmol/L (ref 18–29)
CREATININE: 1.85 mg/dL — AB (ref 0.76–1.27)
Chloride: 99 mmol/L (ref 96–106)
GFR calc Af Amer: 45 mL/min/{1.73_m2} — ABNORMAL LOW (ref >59)
GFR calc non Af Amer: 39 mL/min/{1.73_m2} — ABNORMAL LOW (ref >59)
GLUCOSE: 105 mg/dL — AB (ref 65–99)
Potassium: 4.8 mmol/L (ref 3.5–5.2)
Sodium: 140 mmol/L (ref 134–144)

## 2016-11-17 ENCOUNTER — Ambulatory Visit: Payer: Self-pay

## 2016-11-18 ENCOUNTER — Telehealth: Payer: Self-pay

## 2016-11-18 NOTE — Telephone Encounter (Signed)
Called pt to give results. Pt is taking the amlodipine and not the lisinopril. I scheduled him for 1/25 at 7. Pt verbalized understanding.

## 2016-11-18 NOTE — Telephone Encounter (Signed)
-----   Message from Harle BattiestShannon A McGowan, PA-C sent at 11/17/2016 10:08 AM EST ----- Serum creatinine is worse.  Did he stop his lisinopril and start amlodipine?  He needs an office visit.  His follow up was cancelled due to the weather.

## 2016-11-24 ENCOUNTER — Encounter: Payer: Self-pay | Admitting: Adult Health Nurse Practitioner

## 2016-11-24 ENCOUNTER — Ambulatory Visit: Payer: Self-pay | Admitting: Adult Health Nurse Practitioner

## 2016-11-24 VITALS — BP 158/78 | HR 88 | Wt 146.8 lb

## 2016-11-24 DIAGNOSIS — L02611 Cutaneous abscess of right foot: Secondary | ICD-10-CM | POA: Insufficient documentation

## 2016-11-24 DIAGNOSIS — I10 Essential (primary) hypertension: Secondary | ICD-10-CM

## 2016-11-24 DIAGNOSIS — L03031 Cellulitis of right toe: Secondary | ICD-10-CM

## 2016-11-24 MED ORDER — AMLODIPINE BESYLATE 10 MG PO TABS: 5.0000 mg | ORAL_TABLET | Freq: Every day | ORAL | 1 refills | Status: DC

## 2016-11-24 MED ORDER — CEPHALEXIN 500 MG PO CAPS: 500.0000 mg | ORAL_CAPSULE | Freq: Two times a day (BID) | ORAL | 0 refills | Status: DC

## 2016-11-24 NOTE — Progress Notes (Signed)
  Patient: Brandon Bolton Male    DOB: 14-Apr-1956   61 y.o.   MRN: [id number] Visit Date: 11/24/2016  Today's Provider: Jacelyn Pieah Doles-Johnson, NP   No chief complaint on file.  Subjective:    HPI    HTN:  BP elevated at last visit 169/75.  Lisinopril stopped due to increased Cr.  Norvasc 5mg  started.   Toe Pain:  Started Saturday and worsening over the past two days.  Unknown wether or not any injury.  Pt states he kicked a pallet at work and it has split.  Rates pain 9/10.       No Known Allergies Previous Medications   ACETAMINOPHEN (TYLENOL) 500 MG TABLET    Take 500 mg by mouth every 6 (six) hours as needed.   ALBUTEROL (PROVENTIL HFA;VENTOLIN HFA) 108 (90 BASE) MCG/ACT INHALER    Inhale 2 puffs into the lungs every 6 (six) hours as needed for wheezing or shortness of breath.   AMLODIPINE (NORVASC) 5 MG TABLET    Take 1 tablet (5 mg total) by mouth daily.   ATORVASTATIN (LIPITOR) 10 MG TABLET    Take 1 tablet (10 mg total) by mouth daily.   BUDESONIDE-FORMOTEROL (SYMBICORT) 160-4.5 MCG/ACT INHALER    Inhale 2 puffs into the lungs 2 (two) times daily.   MELOXICAM (MOBIC) 15 MG TABLET    Take 1 tablet (15 mg total) by mouth daily.   SULINDAC (CLINORIL) 150 MG TABLET    Take 150 mg by mouth 2 (two) times daily.    Review of Systems  All other systems reviewed and are negative.   Social History  Substance Use Topics  . Smoking status: Current Every Day Smoker    Packs/day: 1.00    Years: 40.00    Types: Cigarettes  . Smokeless tobacco: Never Used  . 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  Physical Exam  Constitutional: He is oriented to person, place, and time. He appears well-developed and well-nourished.  HENT:  Head: Normocephalic and atraumatic.  Cardiovascular: Normal rate, regular rhythm and normal heart sounds.   Pulmonary/Chest: Effort normal and breath  sounds normal.  Neurological: He is alert and oriented to person, place, and time.  Skin:     Vitals reviewed.       Assessment & Plan:         HTN:  Still not at goal.  Increase Norvasc 10mg  daily.  FU  for BP check.  Low sodium diet. Repeat BMP next week.   Toe Cellulitis:  Take medications as directed.  Clean with soap and water BID.  Keep area clean and dry.  FU in 1 week.    Jacelyn Pieah Doles-Johnson, NP   [facility] [place]

## 2016-11-25 ENCOUNTER — Telehealth: Payer: Self-pay

## 2016-11-25 NOTE — Telephone Encounter (Signed)
Called pt with results. PT verbalized understanding. 

## 2016-11-25 NOTE — Telephone Encounter (Signed)
-----   Message from Jacelyn Pieah Doles-Johnson, NP sent at 11/24/2016  5:41 PM EST ----- PSA is normal. Increase water intake kidney function is slightly elevated. Will re-evaluate on next labs.

## 2016-12-01 ENCOUNTER — Ambulatory Visit: Payer: Self-pay | Admitting: Family Medicine

## 2016-12-01 VITALS — BP 144/81 | HR 100 | Temp 98.0°F | Wt 145.0 lb

## 2016-12-01 DIAGNOSIS — I10 Essential (primary) hypertension: Secondary | ICD-10-CM

## 2016-12-01 DIAGNOSIS — L03031 Cellulitis of right toe: Secondary | ICD-10-CM

## 2016-12-01 NOTE — Progress Notes (Signed)
-  Subjective:     Patient ID: Brandon Bolton, male   DOB: 11-02-1955, 61 y.o.   MRN: [id number]  HPI  Got Keflex for toe pain, possible cellulitis last week, started it on 11/28/16.  Pain is improved but not gone, still has some drainage from the toe.  Had amlodipine dose increased last week.  Has not checked BP since.   No side effects on higher. No CP or SOB.   Review of Systems     Objective:   Physical Exam Op/conj clear CTA RRR No edema.  Lesion on the right foot at base of 4th toe dorsally--mild erythema, no warmth,  Circumscribed border with no drainage.    Assessment:     1. Toe lesion/cellulits, improving 2. HTN    Plan:     1.  Cellulitis. Complete the full course of Anx.  Keep the lesion covered. 2.  HTN. Stay on Amlodipine 10 mg a day.   3.  Recheck in 1 month.

## 2016-12-01 NOTE — Patient Instructions (Signed)
Complete the full course of antibiotics. Keep the toe lesion covered until it dries up or scabs over.

## 2016-12-26 ENCOUNTER — Other Ambulatory Visit: Payer: Self-pay | Admitting: Adult Health Nurse Practitioner

## 2016-12-29 ENCOUNTER — Ambulatory Visit: Payer: Self-pay | Admitting: Adult Health Nurse Practitioner

## 2016-12-29 VITALS — BP 152/78 | HR 88 | Temp 98.4°F | Wt 139.5 lb

## 2016-12-29 DIAGNOSIS — I10 Essential (primary) hypertension: Secondary | ICD-10-CM

## 2016-12-29 DIAGNOSIS — E119 Type 2 diabetes mellitus without complications: Secondary | ICD-10-CM

## 2016-12-29 DIAGNOSIS — J438 Other emphysema: Secondary | ICD-10-CM

## 2016-12-29 MED ORDER — AMLODIPINE BESYLATE 10 MG PO TABS: 10.0000 mg | ORAL_TABLET | Freq: Every day | ORAL | 1 refills | Status: DC

## 2016-12-29 MED ORDER — MELOXICAM 15 MG PO TABS: 15.0000 mg | ORAL_TABLET | Freq: Every day | ORAL | 1 refills | Status: DC | PRN

## 2016-12-29 MED ORDER — ALBUTEROL SULFATE HFA 108 (90 BASE) MCG/ACT IN AERS: 2.0000 | INHALATION_SPRAY | Freq: Four times a day (QID) | RESPIRATORY_TRACT | 4 refills | Status: DC | PRN

## 2016-12-29 MED ORDER — BUDESONIDE-FORMOTEROL FUMARATE 160-4.5 MCG/ACT IN AERO: 2.0000 | INHALATION_SPRAY | Freq: Two times a day (BID) | RESPIRATORY_TRACT | 3 refills | Status: DC

## 2016-12-29 MED ORDER — ATORVASTATIN CALCIUM 10 MG PO TABS: 10.0000 mg | ORAL_TABLET | Freq: Every day | ORAL | 3 refills | Status: DC

## 2016-12-29 NOTE — Progress Notes (Signed)
  Patient: Brandon Bolton Male    DOB: 04-19-1956   61 y.o.   MRN: [id number] Visit Date: 12/29/2016  Today's Provider: Jacelyn Pieah Doles-Johnson, NP   Chief Complaint  Patient presents with  . Follow-up    Medication refills  . Cough    week    Subjective:    HPI  HTN:  Last visit BP 144/81.  Norvasc increased to 10mg  daily on 1.25.18 Has not had medications in 3 days.  Pt states that he has had this cough x 2 weeks. Scant amount of mucous production.  Denies feeling febrile.  Not taking Symbicort but every other day for fear of running out.      No Known Allergies Previous Medications   ALBUTEROL (PROVENTIL HFA;VENTOLIN HFA) 108 (90 BASE) MCG/ACT INHALER    Inhale 2 puffs into the lungs every 6 (six) hours as needed for wheezing or shortness of breath.   AMLODIPINE (NORVASC) 10 MG TABLET    Take 0.5 tablets (5 mg total) by mouth daily.   ATORVASTATIN (LIPITOR) 10 MG TABLET    Take 1 tablet (10 mg total) by mouth daily.   BUDESONIDE-FORMOTEROL (SYMBICORT) 160-4.5 MCG/ACT INHALER    Inhale 2 puffs into the lungs 2 (two) times daily.   CEPHALEXIN (KEFLEX) 500 MG CAPSULE    Take 1 capsule (500 mg total) by mouth 2 (two) times daily.   MELOXICAM (MOBIC) 15 MG TABLET    Take 15 mg by mouth daily.   SULINDAC (CLINORIL) 150 MG TABLET    Take 150 mg by mouth 2 (two) times daily.    Review of Systems  All other systems reviewed and are negative.   Social History  Substance Use Topics  . Smoking status: Current Every Day Smoker    Packs/day: 1.00    Years: 40.00    Types: Cigarettes  . Smokeless tobacco: Never Used  . Alcohol use No     Comment: 2 years sober, normal previously 4-6 beers every other day   Objective:   BP (!) 152/78 (BP Location: Left Arm, Patient Position: Sitting, Cuff Size: Normal)   Pulse 88   Temp 98.4 F (36.9 C)   Wt 139 lb 8 oz (63.3 kg)   BMI 21.21 kg/m   Physical Exam  Constitutional: He is oriented to person, place, and time. He appears well-developed  and well-nourished.  Cardiovascular: Normal rate, regular rhythm and normal heart sounds.   Pulmonary/Chest: Effort normal and breath sounds normal. No respiratory distress. He has no wheezes. He has no rales.  Abdominal: Bowel sounds are normal.  Neurological: He is alert and oriented to person, place, and time.  Skin: Skin is warm and dry.  Vitals reviewed.       Assessment & Plan:          HTN:  Not controlled.  Goal BP <140/90.  Continue current medication regimen. Take as directed.  Encourage low salt diet and exercise.  FU in 2 weeks for BP check.    COPD:  Encourage smoking cessation.  Use medications as directed. FU if symptoms worsen.    Brother passed away recently.  Encouraged support system and to make an appt if needed.  Denies SI.    Meloxicam refilled for chronic hand pain. [facility]  Jacelyn Pieah Doles-Johnson, NP   [facility]

## 2017-01-03 ENCOUNTER — Telehealth: Payer: Self-pay

## 2017-01-03 NOTE — Telephone Encounter (Signed)
Received PAP application from [facility] for Ventolin placed for provider to sign. 

## 2017-01-04 NOTE — Telephone Encounter (Signed)
Placed signed application/script in [facility] folder for pickup. 

## 2017-01-06 ENCOUNTER — Telehealth: Payer: Self-pay | Admitting: Pharmacist

## 2017-01-06 NOTE — Telephone Encounter (Signed)
Faxed script to [facility] for refill-Ventolin HFA - Inhale 2 puffs every 6 hours as needed.

## 2017-01-12 ENCOUNTER — Other Ambulatory Visit: Payer: Self-pay

## 2017-04-20 ENCOUNTER — Ambulatory Visit: Payer: Self-pay

## 2017-06-01 ENCOUNTER — Ambulatory Visit: Payer: Self-pay | Admitting: Adult Health Nurse Practitioner

## 2017-06-01 VITALS — BP 148/80 | HR 86 | Temp 97.7°F | Wt 144.9 lb

## 2017-06-01 DIAGNOSIS — E782 Mixed hyperlipidemia: Secondary | ICD-10-CM

## 2017-06-01 DIAGNOSIS — I10 Essential (primary) hypertension: Secondary | ICD-10-CM

## 2017-06-01 MED ORDER — ATORVASTATIN CALCIUM 10 MG PO TABS: 10.0000 mg | ORAL_TABLET | Freq: Every day | ORAL | 3 refills | Status: DC

## 2017-06-01 MED ORDER — BUDESONIDE-FORMOTEROL FUMARATE 160-4.5 MCG/ACT IN AERO: 2.0000 | INHALATION_SPRAY | Freq: Two times a day (BID) | RESPIRATORY_TRACT | 3 refills | Status: DC

## 2017-06-01 MED ORDER — GABAPENTIN 100 MG PO CAPS: 100.0000 mg | ORAL_CAPSULE | Freq: Two times a day (BID) | ORAL | 3 refills | Status: DC

## 2017-06-01 MED ORDER — AMLODIPINE BESYLATE 10 MG PO TABS: 10.0000 mg | ORAL_TABLET | Freq: Every day | ORAL | 1 refills | Status: DC

## 2017-06-01 MED ORDER — MELOXICAM 15 MG PO TABS: 15.0000 mg | ORAL_TABLET | Freq: Every day | ORAL | 1 refills | Status: DC | PRN

## 2017-06-01 NOTE — Progress Notes (Signed)
   Subjective:    Patient ID: Brandon Bolton, male    DOB: Mar 15, 1956, 61 y.o.   MRN: [id number]  HPI   Pt here for f/u hypertension. Needs amlodipine.  Pt recently had operation on his L hand. Reports pain.  Pt reports smoking. Reports he is watching diet but could do better.    Pt reports he is out of amlodipine.  Pt reports taking gabapentin 3/day.  Pt reports using rescue inhaler 3/day b/c he didn't have symbicort.  Patient Active Problem List   Diagnosis Date Noted  . Cellulitis and abscess of toe of right foot 11/24/2016  . Essential hypertension 10/04/2016  . Mixed hyperlipidemia 09/06/2016  . Chronic obstructive pulmonary disease (HCC) 09/06/2016   Allergies as of 06/01/2017   No Known Allergies     Medication List       Accurate as of 06/01/17  7:03 PM. Always use your most recent med list.          albuterol 108 (90 Base) MCG/ACT inhaler Commonly known as:  PROVENTIL HFA;VENTOLIN HFA Inhale 2 puffs into the lungs every 6 (six) hours as needed for wheezing or shortness of breath.   amLODipine 10 MG tablet Commonly known as:  NORVASC Take 1 tablet (10 mg total) by mouth daily.   atorvastatin 10 MG tablet Commonly known as:  LIPITOR Take 1 tablet (10 mg total) by mouth daily.   budesonide-formoterol 160-4.5 MCG/ACT inhaler Commonly known as:  SYMBICORT Inhale 2 puffs into the lungs 2 (two) times daily.   gabapentin 100 MG capsule Commonly known as:  NEURONTIN Take 100 mg by mouth.   meloxicam 15 MG tablet Commonly known as:  MOBIC Take 1 tablet (15 mg total) by mouth daily as needed for pain.   sulindac 150 MG tablet Commonly known as:  CLINORIL Take 150 mg by mouth 2 (two) times daily.         Review of Systems  All other systems reviewed and are negative.       Objective:   Physical Exam  Constitutional: He is oriented to person, place, and time. He appears well-developed and well-nourished.  Cardiovascular: Normal rate, regular rhythm and  normal heart sounds.   Pulmonary/Chest: Effort normal and breath sounds normal. He has no wheezes.  Abdominal: Soft. Bowel sounds are normal.  Neurological: He is alert and oriented to person, place, and time.  Vitals reviewed.   BP (!) 148/80   Pulse 86   Temp 97.7 F (36.5 C)   Wt 144 lb 14.4 oz (65.7 kg)   BMI 22.03 kg/m        Assessment & Plan:   Labs today Counseled pt on low sodium diet.  Continue current medication regimen.  F/u in 2 weeks for BP check. F/u in 21mo for regular visit.

## 2017-06-01 NOTE — Patient Instructions (Signed)
Return in 2 weeks for BP check. Your regular appointment will be in 60mo

## 2017-06-02 LAB — LIPID PANEL
CHOL/HDL RATIO: 3.2 ratio (ref 0.0–5.0)
CHOLESTEROL TOTAL: 178 mg/dL (ref 100–199)
HDL: 56 mg/dL (ref >39)
LDL CALC: 94 mg/dL (ref 0–99)
TRIGLYCERIDES: 138 mg/dL (ref 0–149)
VLDL CHOLESTEROL CAL: 28 mg/dL (ref 5–40)

## 2017-06-02 LAB — COMPREHENSIVE METABOLIC PANEL
ALK PHOS: 55 IU/L (ref 39–117)
ALT: 6 IU/L (ref 0–44)
AST: 12 IU/L (ref 0–40)
Albumin/Globulin Ratio: 2.3 — ABNORMAL HIGH (ref 1.2–2.2)
Albumin: 4.5 g/dL (ref 3.6–4.8)
BUN/Creatinine Ratio: 16 (ref 10–24)
BUN: 18 mg/dL (ref 8–27)
Bilirubin Total: <0.2 mg/dL (ref 0.0–1.2)
CALCIUM: 9.5 mg/dL (ref 8.6–10.2)
CO2: 25 mmol/L (ref 20–29)
CREATININE: 1.1 mg/dL (ref 0.76–1.27)
Chloride: 103 mmol/L (ref 96–106)
GFR calc Af Amer: 84 mL/min/{1.73_m2} (ref >59)
GFR, EST NON AFRICAN AMERICAN: 73 mL/min/{1.73_m2} (ref >59)
GLUCOSE: 113 mg/dL — AB (ref 65–99)
Globulin, Total: 2 g/dL (ref 1.5–4.5)
Potassium: 4.2 mmol/L (ref 3.5–5.2)
Sodium: 143 mmol/L (ref 134–144)
Total Protein: 6.5 g/dL (ref 6.0–8.5)

## 2017-06-02 LAB — CBC
HEMOGLOBIN: 13.5 g/dL (ref 13.0–17.7)
Hematocrit: 40 % (ref 37.5–51.0)
MCH: 32.1 pg (ref 26.6–33.0)
MCHC: 33.8 g/dL (ref 31.5–35.7)
MCV: 95 fL (ref 79–97)
Platelets: 317 10*3/uL (ref 150–379)
RBC: 4.2 x10E6/uL (ref 4.14–5.80)
RDW: 15.6 % — ABNORMAL HIGH (ref 12.3–15.4)
WBC: 7.4 10*3/uL (ref 3.4–10.8)

## 2017-06-02 LAB — HEMOGLOBIN A1C
Est. average glucose Bld gHb Est-mCnc: 105 mg/dL
Hgb A1c MFr Bld: 5.3 % (ref 4.8–5.6)

## 2017-06-02 LAB — TSH: TSH: 3.64 u[IU]/mL (ref 0.450–4.500)

## 2017-06-15 ENCOUNTER — Ambulatory Visit: Payer: Self-pay

## 2017-06-15 ENCOUNTER — Other Ambulatory Visit: Payer: Self-pay

## 2017-06-15 VITALS — BP 129/74 | HR 104

## 2017-06-15 DIAGNOSIS — I270 Primary pulmonary hypertension: Secondary | ICD-10-CM

## 2017-06-15 MED ORDER — BUDESONIDE-FORMOTEROL FUMARATE 160-4.5 MCG/ACT IN AERO: 2.0000 | INHALATION_SPRAY | Freq: Two times a day (BID) | RESPIRATORY_TRACT | 3 refills | Status: DC

## 2017-06-15 NOTE — Telephone Encounter (Signed)
Patient needs symbicort refilled.  Was [facility] sent to [facility], though the pharmacy did not receive it.

## 2017-08-06 ENCOUNTER — Other Ambulatory Visit: Payer: Self-pay | Admitting: Adult Health Nurse Practitioner

## 2017-09-28 ENCOUNTER — Other Ambulatory Visit: Payer: Self-pay

## 2017-09-28 ENCOUNTER — Emergency Department
Admission: EM | Admit: 2017-09-28 | Discharge: 2017-09-28 | Disposition: A | Discharge status: Home / Self Care | Payer: Medicaid Other | Attending: Emergency Medicine | Admitting: Emergency Medicine

## 2017-09-28 DIAGNOSIS — I10 Essential (primary) hypertension: Secondary | ICD-10-CM | POA: Insufficient documentation

## 2017-09-28 DIAGNOSIS — F10129 Alcohol abuse with intoxication, unspecified: Secondary | ICD-10-CM | POA: Diagnosis not present

## 2017-09-28 DIAGNOSIS — J449 Chronic obstructive pulmonary disease, unspecified: Secondary | ICD-10-CM | POA: Diagnosis not present

## 2017-09-28 DIAGNOSIS — F17210 Nicotine dependence, cigarettes, uncomplicated: Secondary | ICD-10-CM | POA: Insufficient documentation

## 2017-09-28 DIAGNOSIS — Z79899 Other long term (current) drug therapy: Secondary | ICD-10-CM | POA: Diagnosis not present

## 2017-09-28 DIAGNOSIS — F1010 Alcohol abuse, uncomplicated: Secondary | ICD-10-CM

## 2017-09-28 DIAGNOSIS — F10929 Alcohol use, unspecified with intoxication, unspecified: Secondary | ICD-10-CM | POA: Diagnosis present

## 2017-09-28 HISTORY — DX: Essential (primary) hypertension: I10

## 2017-09-28 LAB — URINE DRUG SCREEN, QUALITATIVE (ARMC ONLY)
Amphetamines, Ur Screen: NOT DETECTED
BENZODIAZEPINE, UR SCRN: NOT DETECTED
Barbiturates, Ur Screen: NOT DETECTED
CANNABINOID 50 NG, UR ~~LOC~~: NOT DETECTED
Cocaine Metabolite,Ur ~~LOC~~: NOT DETECTED
MDMA (Ecstasy)Ur Screen: NOT DETECTED
Methadone Scn, Ur: NOT DETECTED
Opiate, Ur Screen: NOT DETECTED
PHENCYCLIDINE (PCP) UR S: NOT DETECTED
Tricyclic, Ur Screen: NOT DETECTED

## 2017-09-28 LAB — COMPREHENSIVE METABOLIC PANEL
ALBUMIN: 4.7 g/dL (ref 3.5–5.0)
ALK PHOS: 84 U/L (ref 38–126)
ALT: 88 U/L — ABNORMAL HIGH (ref 17–63)
AST: 82 U/L — AB (ref 15–41)
Anion gap: 15 (ref 5–15)
BILIRUBIN TOTAL: 0.9 mg/dL (ref 0.3–1.2)
BUN: 6 mg/dL (ref 6–20)
CALCIUM: 9.4 mg/dL (ref 8.9–10.3)
CO2: 23 mmol/L (ref 22–32)
CREATININE: 0.69 mg/dL (ref 0.61–1.24)
Chloride: 96 mmol/L — ABNORMAL LOW (ref 101–111)
GFR calc Af Amer: >60 mL/min (ref >60)
GFR calc non Af Amer: >60 mL/min (ref >60)
GLUCOSE: 110 mg/dL — AB (ref 65–99)
Potassium: 4.1 mmol/L (ref 3.5–5.1)
Sodium: 134 mmol/L — ABNORMAL LOW (ref 135–145)
TOTAL PROTEIN: 7.7 g/dL (ref 6.5–8.1)

## 2017-09-28 LAB — ETHANOL: Alcohol, Ethyl (B): <10 mg/dL (ref <10)

## 2017-09-28 LAB — CBC
HCT: 49.5 % (ref 40.0–52.0)
HEMOGLOBIN: 17.1 g/dL (ref 13.0–18.0)
MCH: 33.6 pg (ref 26.0–34.0)
MCHC: 34.5 g/dL (ref 32.0–36.0)
MCV: 97.4 fL (ref 80.0–100.0)
Platelets: 247 10*3/uL (ref 150–440)
RBC: 5.08 MIL/uL (ref 4.40–5.90)
RDW: 15.2 % — ABNORMAL HIGH (ref 11.5–14.5)
WBC: 6.5 10*3/uL (ref 3.8–10.6)

## 2017-09-28 MED ORDER — NICOTINE 21 MG/24HR TD PT24
21.0000 mg | MEDICATED_PATCH | Freq: Once | TRANSDERMAL | Status: DC
  Administered 2017-09-28: 21 mg via TRANSDERMAL
  Filled 2017-09-28: qty 1

## 2017-09-28 NOTE — ED Notes (Signed)
Pt requesting to walk outside to smoke, pt notified of campus policy.  EDP aware; see new orders.

## 2017-09-28 NOTE — ED Notes (Signed)
Pt ambulatory to bathroom without difficulty.  

## 2017-09-28 NOTE — BH Assessment (Signed)
Per the request of ER MD (Dr. Lenard LancePaduchowski) writer spoke with patient about treatment for his alcohol use.   Writer discussed with the patient about the option of [facility]. Patient was in the agreement with the plan.  Patient signed the Release of information, in order to send labs and ER Note.   Information sent to [facility] via fax and confirmed it was received.  Writer received phone call from [facility] ([phone]), stating they will be able to admit him to their facility and will be at the ER to transport him within the hour.  Writer updated patient's nurse Morrie Sheldon(Ashley).

## 2017-09-28 NOTE — ED Notes (Signed)
Per [facility], [facility] will be here within the hour to pick up pt to take him into their services.

## 2017-09-28 NOTE — ED Provider Notes (Signed)
[facility] Emergency Department Provider Note  Time seen: 10:03 AM  I have reviewed the triage vital signs and the nursing notes.   HISTORY  Chief Complaint Alcohol Intoxication    HPI Brandon Bolton is a 61 y.o. male with a past medical history of COPD, alcohol abuse, hypertension, presents to the emergency department hoping for medical detox.  According to the patient he has been using alcohol heavily over the past 1 year.  He states prior to 1 year ago he was sober for over 1 year before relapsing.  Patient is coming to the emergency department today hoping to detox from alcohol.  Denies any drug use.  Denies any history of complicated withdrawal in the past such as seizures.  The patient has never been here for alcohol detox per patient.  Patient has no medical complaints today.  Normal review of systems.   Past Medical History:  Diagnosis Date  . COPD (chronic obstructive pulmonary disease) (HCC)   . Drug abuse (HCC)   . Dupuytren's contracture   . Hypertension     Patient Active Problem List   Diagnosis Date Noted  . Cellulitis and abscess of toe of right foot 11/24/2016  . Essential hypertension 10/04/2016  . Mixed hyperlipidemia 09/06/2016  . Chronic obstructive pulmonary disease (HCC) 09/06/2016    Past Surgical History:  Procedure Laterality Date  . HAND SURGERY      Prior to Admission medications   Medication Sig Start Date End Date Taking? Authorizing Provider  albuterol (PROVENTIL HFA;VENTOLIN HFA) 108 (90 Base) MCG/ACT inhaler Inhale 2 puffs into the lungs every 6 (six) hours as needed for wheezing or shortness of breath. 12/29/16   Doles-Johnson, Teah, NP  amLODipine (NORVASC) 10 MG tablet Take 1 tablet (10 mg total) by mouth daily. 06/01/17   Doles-Johnson, Teah, NP  atorvastatin (LIPITOR) 10 MG tablet Take 1 tablet (10 mg total) by mouth daily. 06/01/17   Doles-Johnson, Teah, NP  budesonide-formoterol (SYMBICORT) 160-4.5 MCG/ACT inhaler  Inhale 2 puffs into the lungs 2 (two) times daily. 06/15/17   Doles-Johnson, Teah, NP  gabapentin (NEURONTIN) 100 MG capsule Take 1 capsule (100 mg total) by mouth 2 (two) times daily. 06/01/17   Doles-Johnson, Teah, NP  meloxicam (MOBIC) 15 MG tablet TAKE 1 TABLET BY MOUTH ONCE DAILY AS NEEDED FOR PAIN 08/07/17   Doles-Johnson, Teah, NP  sulindac (CLINORIL) 150 MG tablet Take 150 mg by mouth 2 (two) times daily.    [provider]    No Known Allergies  Family History  Problem Relation Age of Onset  . Hypertension Father   . Heart disease Father   . Arthritis Father   . Cancer Father   . Allergic Disorder Mother     Social History Social History   Tobacco Use  . Smoking status: Current Every Day Smoker    Packs/day: 1.00    Years: 40.00    Pack years: 40.00    Types: Cigarettes  . Smokeless tobacco: Never Used  Substance Use Topics  . Alcohol use: Yes    Comment: 2 quarts of beer/day  . Drug use: No    Comment: former heroin user (inj)    Review of Systems Constitutional: Negative for fever. Cardiovascular: Negative for chest pain. Respiratory: Negative for shortness of breath. Gastrointestinal: Negative for abdominal pain Neurological: Negative for headache All other ROS negative  ____________________________________________   PHYSICAL EXAM:  VITAL SIGNS: ED Triage Vitals  Enc Vitals Group     BP  09/28/17 0929 (!) 145/83     Pulse Rate 09/28/17 0928 (!) 117     Resp 09/28/17 0928 20     Temp 09/28/17 0928 98.3 F (36.8 C)     Temp Source 09/28/17 0928 Oral     SpO2 09/28/17 0928 97 %     Weight 09/28/17 0928 145 lb (65.8 kg)     Height 09/28/17 0928 5\' 8"  (1.727 m)     Head Circumference --      Peak Flow --      Pain Score --      Pain Loc --      Pain Edu? --      Excl. in GC? --    Constitutional: Alert and oriented. Well appearing and in no distress. Eyes: Normal exam ENT   Head: Normocephalic and atraumatic.   Mouth/Throat:  Mucous membranes are moist. Cardiovascular: Normal rate, regular rhythm. No murmur Respiratory: Normal respiratory effort without tachypnea nor retractions. Breath sounds are clear  Gastrointestinal: Soft and nontender. No distention.   Musculoskeletal: Nontender with normal range of motion in all extremities. Neurologic:  Normal speech and language. No gross focal neurologic deficits  Skin:  Skin is warm, dry and intact.  Psychiatric: Mood and affect are normal.   ____________________________________________   INITIAL IMPRESSION / ASSESSMENT AND PLAN / ED COURSE  Pertinent labs & imaging results that were available during my care of the patient were reviewed by me and considered in my medical decision making (see chart for details).  Patient presents to the emergency department for alcohol use disorder hoping to detox from alcohol.  Patient has no medical complaints today.  Differential includes alcoholism, possible substance use, depression leading to alcoholism.  We will check labs, discuss with behavioral health for possible detox placement.  Patient agreeable to this plan of care.  Patient states last alcohol use was early this morning.  Patient's workup is largely normal with a negative urine drug screen and negative alcohol level.  Patient has been accepted to [facility] for detox.  They are coming to transport the patient to their facility.  I reviewed the patient's records which are largely for orthopedic issues, nothing significant or relevant to today's ER visit. ____________________________________________   FINAL CLINICAL IMPRESSION(S) / ED DIAGNOSES  Alcohol use disorder    Minna AntisPaduchowski, Kalli Greenfield, MD 09/28/17 1313

## 2017-09-28 NOTE — ED Notes (Signed)
Pt provided PO fluids.

## 2017-09-28 NOTE — ED Notes (Signed)
TTS to bedside at this time. 

## 2017-09-28 NOTE — ED Notes (Signed)
Pt aware that we need urine specimen; states he does not need to go at this time.

## 2017-09-28 NOTE — ED Notes (Signed)
Pt reports being here for ETOH detox so that [facility]  Pt states last drink was last night, drinking approximately 2-3 quarts of beer per day.  No signs of withdrawal seen at this time.

## 2017-09-28 NOTE — ED Triage Notes (Signed)
[facility] a [facility] and now is drinking a couple of quarts a day. States they want let him come back until he is checked in here for ETOH abuse.

## 2017-10-05 ENCOUNTER — Ambulatory Visit: Payer: Medicaid Other | Admitting: Adult Health Nurse Practitioner

## 2017-10-05 VITALS — BP 172/96 | HR 109 | Temp 98.0°F | Wt 152.7 lb

## 2017-10-05 DIAGNOSIS — E782 Mixed hyperlipidemia: Secondary | ICD-10-CM

## 2017-10-05 DIAGNOSIS — J449 Chronic obstructive pulmonary disease, unspecified: Secondary | ICD-10-CM

## 2017-10-05 DIAGNOSIS — I10 Essential (primary) hypertension: Secondary | ICD-10-CM

## 2017-10-05 DIAGNOSIS — R600 Localized edema: Secondary | ICD-10-CM | POA: Insufficient documentation

## 2017-10-05 MED ORDER — BUDESONIDE-FORMOTEROL FUMARATE 160-4.5 MCG/ACT IN AERO: 2.0000 | INHALATION_SPRAY | Freq: Two times a day (BID) | RESPIRATORY_TRACT | 3 refills | Status: DC

## 2017-10-05 MED ORDER — ATORVASTATIN CALCIUM 10 MG PO TABS: 10.0000 mg | ORAL_TABLET | Freq: Every day | ORAL | 3 refills | Status: DC

## 2017-10-05 NOTE — Progress Notes (Signed)
   Subjective:    Patient ID: Brandon Bolton, male    DOB: 1956-08-11, 61 y.o.   MRN: [id number]  HPI   Brandon Bolton is a 61 yo male here for f/u of hypertension and med refills.  Pt was recently seen in the ED on 11/29 hoping for Medical detox due to heavy alcohol use over the past year. He was accepted to [facility] for detox.  Pt reports he hasn't taken his BP med or Lipitor today b/c he is out.   Patient Active Problem List   Diagnosis Date Noted  . Cellulitis and abscess of toe of right foot 11/24/2016  . Essential hypertension 10/04/2016  . Mixed hyperlipidemia 09/06/2016  . Chronic obstructive pulmonary disease (HCC) 09/06/2016   Allergies as of 10/05/2017   No Known Allergies     Medication List        Accurate as of 10/05/17  6:24 PM. Always use your most recent med list.          albuterol 108 (90 Base) MCG/ACT inhaler Commonly known as:  PROVENTIL HFA;VENTOLIN HFA Inhale 2 puffs into the lungs every 6 (six) hours as needed for wheezing or shortness of breath.   amLODipine 10 MG tablet Commonly known as:  NORVASC Take 1 tablet (10 mg total) by mouth daily.   atorvastatin 10 MG tablet Commonly known as:  LIPITOR Take 1 tablet (10 mg total) by mouth daily.   budesonide-formoterol 160-4.5 MCG/ACT inhaler Commonly known as:  SYMBICORT Inhale 2 puffs into the lungs 2 (two) times daily.   gabapentin 100 MG capsule Commonly known as:  NEURONTIN Take 1 capsule (100 mg total) by mouth 2 (two) times daily.   meloxicam 15 MG tablet Commonly known as:  MOBIC TAKE 1 TABLET BY MOUTH ONCE DAILY AS NEEDED FOR PAIN   sulindac 150 MG tablet Commonly known as:  CLINORIL Take 150 mg by mouth 2 (two) times daily.        Review of Systems Edema in ankles - pt reports edema bilaterally consistently for a week. Pt shares he was recently sleeping in his car w/ no elevation for his feet.  Hypertension - pt reports his BP has been running at target recently, 117/60.    Objective:   Physical Exam  Constitutional: He is oriented to person, place, and time. He appears well-developed and well-nourished.  Cardiovascular: Normal rate, regular rhythm and normal heart sounds.  Pulmonary/Chest: Effort normal. He has decreased breath sounds in the right lower field and the left lower field. He has wheezes.  Abdominal: Soft. Bowel sounds are normal.  Neurological: He is alert and oriented to person, place, and time.  Vitals reviewed.   BP (!) 172/96 (BP Location: Left Arm, Patient Position: Sitting, Cuff Size: Normal)   Pulse (!) 109   Temp 98 F (36.7 C) (Oral)   Wt 152 lb 11.2 oz (69.3 kg)   BMI 23.22 kg/m   Recheck BP at 168/92.     Assessment & Plan:   Labs were reviewed from hospital visit. Edema - advised pt to sleep with pillow under feet and to decrease sodium intake. Continue to monitor.  F/u in 6 weeks to check BP and edema and evaluate need for change in therapy.

## 2017-11-04 ENCOUNTER — Other Ambulatory Visit: Payer: Self-pay | Admitting: Adult Health Nurse Practitioner

## 2017-11-16 ENCOUNTER — Ambulatory Visit: Payer: Medicaid Other | Admitting: Family Medicine

## 2017-11-16 VITALS — BP 138/76 | HR 96 | Temp 97.5°F | Wt 138.8 lb

## 2017-11-16 DIAGNOSIS — J45909 Unspecified asthma, uncomplicated: Secondary | ICD-10-CM

## 2017-11-16 DIAGNOSIS — Z720 Tobacco use: Secondary | ICD-10-CM

## 2017-11-16 DIAGNOSIS — I10 Essential (primary) hypertension: Secondary | ICD-10-CM

## 2017-11-16 DIAGNOSIS — E785 Hyperlipidemia, unspecified: Secondary | ICD-10-CM

## 2017-11-16 DIAGNOSIS — Z09 Encounter for follow-up examination after completed treatment for conditions other than malignant neoplasm: Secondary | ICD-10-CM

## 2017-11-16 DIAGNOSIS — J438 Other emphysema: Secondary | ICD-10-CM

## 2017-11-16 MED ORDER — ATORVASTATIN CALCIUM 10 MG PO TABS: 10.0000 mg | ORAL_TABLET | Freq: Every day | ORAL | 3 refills | Status: DC

## 2017-11-16 MED ORDER — AMLODIPINE BESYLATE 10 MG PO TABS: 10.0000 mg | ORAL_TABLET | Freq: Every day | ORAL | 1 refills | Status: DC

## 2017-11-16 MED ORDER — BUDESONIDE-FORMOTEROL FUMARATE 160-4.5 MCG/ACT IN AERO: 2.0000 | INHALATION_SPRAY | Freq: Two times a day (BID) | RESPIRATORY_TRACT | 3 refills | Status: DC

## 2017-11-16 MED ORDER — MELOXICAM 15 MG PO TABS: ORAL_TABLET | ORAL | 3 refills | Status: DC

## 2017-11-16 MED ORDER — ALBUTEROL SULFATE HFA 108 (90 BASE) MCG/ACT IN AERS: 2.0000 | INHALATION_SPRAY | Freq: Four times a day (QID) | RESPIRATORY_TRACT | 3 refills | Status: DC | PRN

## 2017-11-16 MED ORDER — GABAPENTIN 100 MG PO CAPS: 100.0000 mg | ORAL_CAPSULE | Freq: Two times a day (BID) | ORAL | 3 refills | Status: DC

## 2017-11-16 NOTE — Progress Notes (Signed)
Patient: Brandon Bolton Male    DOB: [birth year]   62 y.o.   MRN: [id number] Visit Date: 11/16/2017  Today's Provider: [facility]   Chief Complaint  Patient presents with  . Follow-up  . Dental Pain   Subjective:   HPI  Patient is here today for follow up assessment and refills on current medications.   No Known Allergies Previous Medications   SULINDAC (CLINORIL) 150 MG TABLET    Take 150 mg by mouth 2 (two) times daily.   Allergies as of 11/16/2017   No Known Allergies     Medication List        Accurate as of 11/16/17  7:27 PM. Always use your most recent med list.          albuterol 108 (90 Base) MCG/ACT inhaler Commonly known as:  PROVENTIL HFA;VENTOLIN HFA Inhale 2 puffs into the lungs every 6 (six) hours as needed for wheezing or shortness of breath.   amLODipine 10 MG tablet Commonly known as:  NORVASC Take 1 tablet (10 mg total) by mouth daily.   atorvastatin 10 MG tablet Commonly known as:  LIPITOR Take 1 tablet (10 mg total) by mouth daily.   budesonide-formoterol 160-4.5 MCG/ACT inhaler Commonly known as:  SYMBICORT Inhale 2 puffs into the lungs 2 (two) times daily.   gabapentin 100 MG capsule Commonly known as:  NEURONTIN Take 1 capsule (100 mg total) by mouth 2 (two) times daily.   meloxicam 15 MG tablet Commonly known as:  MOBIC TAKE 1 TABLET BY MOUTH ONCE DAILY AS NEEDED FOR PAIN   sulindac 150 MG tablet Commonly known as:  CLINORIL Take 150 mg by mouth 2 (two) times daily.       Review of Systems  Constitutional: Negative.   HENT: Negative.   Eyes: Negative.   Respiratory: Positive for cough and shortness of breath.   Endocrine: Negative.   Genitourinary: Negative.   Allergic/Immunologic: Negative.   All other systems reviewed and are negative.   Social History   Tobacco Use  . Smoking status: Current Every Day Smoker    Packs/day: 1.00    Years: 40.00    Pack years: 40.00    Types: Cigarettes  . Smokeless  tobacco: Never Used  Substance Use Topics  . Alcohol use: No    Frequency: Never    Comment: 2 quarts of beer/day   Objective:   BP 138/76 (BP Location: Left Arm, Patient Position: Sitting)   Pulse 96   Temp (!) 97.5 F (36.4 C)   Wt 138 lb 12.8 oz (63 kg)   BMI 21.10 kg/m   Physical Exam  Constitutional: He is oriented to person, place, and time. He appears well-developed and well-nourished.  HENT:  Head: Normocephalic and atraumatic.  Right Ear: External ear normal.  Left Ear: External ear normal.  Mouth/Throat: Oropharynx is clear and moist.  Eyes: Conjunctivae and EOM are normal. Pupils are equal, round, and reactive to light.  Neck: Normal range of motion. Neck supple.  Cardiovascular: Normal rate, regular rhythm, normal heart sounds and intact distal pulses.  Pulmonary/Chest: Effort normal and breath sounds normal.  Abdominal: Soft. Bowel sounds are normal.  Musculoskeletal: Normal range of motion.  Neurological: He is alert and oriented to person, place, and time.  Skin: Skin is warm and dry.  Psychiatric: He has a normal mood and affect. His behavior is normal. Judgment and thought content normal.       Assessment & Plan:   1.  Other emphysema (HCC) Cough and occasional shortness of breath. Continue to use Inhalers as directed.  - albuterol (PROVENTIL HFA;VENTOLIN HFA) 108 (90 Base) MCG/ACT inhaler; Inhale 2 puffs into the lungs every 6 (six) hours as needed for wheezing or shortness of breath.  Dispense: 18 g; Refill: 3  2. Declined smoking cessation Patient states that he is not ready to quit smoking as of yet.  3. Mild asthma without complication, unspecified whether persistent See # 1.  4. Hypertension, unspecified type BP is 138/76 today. Continue to take Amlodipine as directed.  5. Hyperlipidemia, u/nspecified hyperlipidemia type Lipid panel normal on 06/01/2017. Continue Lipitor as directed.  6. Follow up Patient to follow up in 3 months with Labs/OV.       [facility]   [facility]

## 2018-01-29 DIAGNOSIS — E782 Mixed hyperlipidemia: Secondary | ICD-10-CM

## 2018-01-29 HISTORY — DX: Mixed hyperlipidemia: E78.2

## 2018-02-13 ENCOUNTER — Other Ambulatory Visit: Payer: Medicaid Other

## 2018-02-13 DIAGNOSIS — Z0000 Encounter for general adult medical examination without abnormal findings: Secondary | ICD-10-CM

## 2018-02-13 DIAGNOSIS — E782 Mixed hyperlipidemia: Secondary | ICD-10-CM

## 2018-02-14 LAB — LIPID PANEL
CHOLESTEROL TOTAL: 130 mg/dL (ref 100–199)
Chol/HDL Ratio: 3.3 ratio (ref 0.0–5.0)
HDL: 39 mg/dL — ABNORMAL LOW (ref >39)
LDL Calculated: 67 mg/dL (ref 0–99)
TRIGLYCERIDES: 122 mg/dL (ref 0–149)
VLDL CHOLESTEROL CAL: 24 mg/dL (ref 5–40)

## 2018-02-14 LAB — COMPREHENSIVE METABOLIC PANEL
A/G RATIO: 1.7 (ref 1.2–2.2)
ALBUMIN: 4 g/dL (ref 3.6–4.8)
ALT: 10 IU/L (ref 0–44)
AST: 20 IU/L (ref 0–40)
Alkaline Phosphatase: 74 IU/L (ref 39–117)
BILIRUBIN TOTAL: 0.2 mg/dL (ref 0.0–1.2)
BUN / CREAT RATIO: 11 (ref 10–24)
BUN: 12 mg/dL (ref 8–27)
CALCIUM: 9.2 mg/dL (ref 8.6–10.2)
CHLORIDE: 101 mmol/L (ref 96–106)
CO2: 26 mmol/L (ref 20–29)
Creatinine, Ser: 1.07 mg/dL (ref 0.76–1.27)
GFR, EST AFRICAN AMERICAN: 86 mL/min/{1.73_m2} (ref >59)
GFR, EST NON AFRICAN AMERICAN: 75 mL/min/{1.73_m2} (ref >59)
GLOBULIN, TOTAL: 2.3 g/dL (ref 1.5–4.5)
Glucose: 91 mg/dL (ref 65–99)
POTASSIUM: 4.7 mmol/L (ref 3.5–5.2)
Sodium: 140 mmol/L (ref 134–144)
Total Protein: 6.3 g/dL (ref 6.0–8.5)

## 2018-02-14 LAB — CBC
HEMATOCRIT: 36.6 % — AB (ref 37.5–51.0)
HEMOGLOBIN: 12.2 g/dL — AB (ref 13.0–17.7)
MCH: 31.3 pg (ref 26.6–33.0)
MCHC: 33.3 g/dL (ref 31.5–35.7)
MCV: 94 fL (ref 79–97)
PLATELETS: 460 10*3/uL — AB (ref 150–379)
RBC: 3.9 x10E6/uL — AB (ref 4.14–5.80)
RDW: 14.3 % (ref 12.3–15.4)
WBC: 9 10*3/uL (ref 3.4–10.8)

## 2018-02-14 LAB — HEMOGLOBIN A1C
Est. average glucose Bld gHb Est-mCnc: 114 mg/dL
Hgb A1c MFr Bld: 5.6 % (ref 4.8–5.6)

## 2018-02-14 LAB — TSH: TSH: 2.46 u[IU]/mL (ref 0.450–4.500)

## 2018-02-20 ENCOUNTER — Ambulatory Visit: Payer: Medicaid Other | Admitting: Adult Health Nurse Practitioner

## 2018-02-20 VITALS — BP 118/68 | HR 104 | Wt 141.9 lb

## 2018-02-20 DIAGNOSIS — D649 Anemia, unspecified: Secondary | ICD-10-CM

## 2018-02-20 DIAGNOSIS — E782 Mixed hyperlipidemia: Secondary | ICD-10-CM

## 2018-02-20 DIAGNOSIS — J449 Chronic obstructive pulmonary disease, unspecified: Secondary | ICD-10-CM

## 2018-02-20 MED ORDER — CETIRIZINE HCL 10 MG PO TABS: 10.0000 mg | ORAL_TABLET | Freq: Every day | ORAL | 3 refills | Status: DC

## 2018-02-20 MED ORDER — GABAPENTIN 100 MG PO CAPS: 100.0000 mg | ORAL_CAPSULE | Freq: Two times a day (BID) | ORAL | 3 refills | Status: DC

## 2018-02-20 MED ORDER — BUDESONIDE-FORMOTEROL FUMARATE 160-4.5 MCG/ACT IN AERO: 2.0000 | INHALATION_SPRAY | Freq: Two times a day (BID) | RESPIRATORY_TRACT | 3 refills | Status: DC

## 2018-02-20 MED ORDER — ATORVASTATIN CALCIUM 10 MG PO TABS: 10.0000 mg | ORAL_TABLET | Freq: Every day | ORAL | 3 refills | Status: DC

## 2018-02-20 MED ORDER — AMLODIPINE BESYLATE 10 MG PO TABS: 10.0000 mg | ORAL_TABLET | Freq: Every day | ORAL | 1 refills | Status: DC

## 2018-02-20 NOTE — Progress Notes (Signed)
  Patient: Brandon Bolton Male    DOB: 05/16/1956   62 y.o.   MRN: [id number] Visit Date: 02/20/2018  Today's Provider: Jacelyn Pieah Doles-Johnson, NP   Chief Complaint  Patient presents with  . Follow-up    allergies, med refills   Subjective:    HPI   Taking medications as directed.  Has not stopped smoking yet.  States he is monitoring the sodium in his diet.   Hgb dropped to 12.2 from 17.1 four months ago; denies hematuria, hematochezia or melena.   Denies increased sputum production.  Using Symbicort as directed.  States that the pollen is giving him a "fit" and he has been taking benadryl with no relief.  States that he has been having to use his rescue inhaler more often due to the change of seasons.   No Known Allergies Previous Medications   ALBUTEROL (PROVENTIL HFA;VENTOLIN HFA) 108 (90 BASE) MCG/ACT INHALER    Inhale 2 puffs into the lungs every 6 (six) hours as needed for wheezing or shortness of breath.   AMLODIPINE (NORVASC) 10 MG TABLET    Take 1 tablet (10 mg total) by mouth daily.   ATORVASTATIN (LIPITOR) 10 MG TABLET    Take 1 tablet (10 mg total) by mouth daily.   BUDESONIDE-FORMOTEROL (SYMBICORT) 160-4.5 MCG/ACT INHALER    Inhale 2 puffs into the lungs 2 (two) times daily.   GABAPENTIN (NEURONTIN) 100 MG CAPSULE    Take 1 capsule (100 mg total) by mouth 2 (two) times daily.   MELOXICAM (MOBIC) 15 MG TABLET    TAKE 1 TABLET BY MOUTH ONCE DAILY AS NEEDED FOR PAIN   SULINDAC (CLINORIL) 150 MG TABLET    Take 150 mg by mouth 2 (two) times daily.    Review of Systems  HENT: Positive for rhinorrhea.   Eyes: Positive for discharge and itching.    Social History   Tobacco Use  . Smoking status: Current Every Day Smoker    Packs/day: 1.00    Years: 40.00    Pack years: 40.00    Types: Cigarettes  . Smokeless tobacco: Never Used  Substance Use Topics  . Alcohol use: No    Frequency: Never    Comment: 2 quarts of beer/day   Objective:   BP 118/68   Pulse (!) 104    Wt 141 lb 14.4 oz (64.4 kg)   BMI 21.58 kg/m   Physical Exam  Constitutional: He appears well-developed and well-nourished.  HENT:  Head: Normocephalic and atraumatic.  Eyes: Pupils are equal, round, and reactive to light. EOM are normal.  Neck: Normal range of motion. Neck supple.  Cardiovascular: Normal rate, regular rhythm and normal heart sounds.  Pulmonary/Chest: Effort normal. He has decreased breath sounds.  Abdominal: Soft. Bowel sounds are normal.  Skin: Skin is warm and dry.        Assessment & Plan:     Will repeat CBC in 1 month.  Bleeding precautions given.  If still low consider stopping meloxicam.    HTN:  Controlled.  Goal BP <140/90.  Continue current medication regimen.  Encourage low salt diet and exercise.   COPD:  Continue current medications.  Monitor for exacerbations.  Rx for Zyrtec to combat allergies.  Continue rescue inhaler PRN.  Smoking cessation.    HLD:  Controlled.   Continue current regimen.  Encourage low cholesterol, low fat diet and exercise.      Jacelyn Pieah Doles-Johnson, NP   [facility] [place]

## 2018-03-09 ENCOUNTER — Other Ambulatory Visit: Payer: Self-pay | Admitting: Family Medicine

## 2018-03-09 DIAGNOSIS — J438 Other emphysema: Secondary | ICD-10-CM

## 2018-03-20 ENCOUNTER — Other Ambulatory Visit: Payer: Medicaid Other

## 2018-03-20 DIAGNOSIS — D649 Anemia, unspecified: Secondary | ICD-10-CM

## 2018-03-21 LAB — CBC
Hematocrit: 39 % (ref 37.5–51.0)
Hemoglobin: 13.4 g/dL (ref 13.0–17.7)
MCH: 31.7 pg (ref 26.6–33.0)
MCHC: 34.4 g/dL (ref 31.5–35.7)
MCV: 92 fL (ref 79–97)
PLATELETS: 306 10*3/uL (ref 150–450)
RBC: 4.23 x10E6/uL (ref 4.14–5.80)
RDW: 14.3 % (ref 12.3–15.4)
WBC: 8 10*3/uL (ref 3.4–10.8)

## 2018-05-09 ENCOUNTER — Other Ambulatory Visit: Payer: Self-pay | Admitting: Family Medicine

## 2018-05-14 ENCOUNTER — Other Ambulatory Visit: Payer: Self-pay | Admitting: Family Medicine

## 2018-06-14 ENCOUNTER — Ambulatory Visit: Payer: Medicaid Other | Admitting: Adult Health Nurse Practitioner

## 2018-06-14 VITALS — BP 139/76 | HR 96 | Temp 98.2°F | Ht 69.0 in | Wt 148.1 lb

## 2018-06-14 DIAGNOSIS — J449 Chronic obstructive pulmonary disease, unspecified: Secondary | ICD-10-CM

## 2018-06-14 DIAGNOSIS — E782 Mixed hyperlipidemia: Secondary | ICD-10-CM

## 2018-06-14 DIAGNOSIS — I10 Essential (primary) hypertension: Secondary | ICD-10-CM

## 2018-06-14 DIAGNOSIS — J438 Other emphysema: Secondary | ICD-10-CM

## 2018-06-14 MED ORDER — CETIRIZINE HCL 10 MG PO TABS: 10.0000 mg | ORAL_TABLET | Freq: Every day | ORAL | 3 refills | Status: DC

## 2018-06-14 MED ORDER — BUDESONIDE-FORMOTEROL FUMARATE 160-4.5 MCG/ACT IN AERO: 2.0000 | INHALATION_SPRAY | Freq: Two times a day (BID) | RESPIRATORY_TRACT | 3 refills | Status: DC

## 2018-06-14 MED ORDER — ATORVASTATIN CALCIUM 10 MG PO TABS: 10.0000 mg | ORAL_TABLET | Freq: Every day | ORAL | 3 refills | Status: DC

## 2018-06-14 MED ORDER — AMLODIPINE BESYLATE 10 MG PO TABS: 10.0000 mg | ORAL_TABLET | Freq: Every day | ORAL | 1 refills | Status: DC

## 2018-06-14 MED ORDER — VENTOLIN HFA 108 (90 BASE) MCG/ACT IN AERS: INHALATION_SPRAY | RESPIRATORY_TRACT | 3 refills | Status: DC

## 2018-06-14 MED ORDER — MELOXICAM 15 MG PO TABS: ORAL_TABLET | ORAL | 3 refills | Status: DC

## 2018-06-14 NOTE — Progress Notes (Signed)
   Subjective:    Patient ID: Brandon Bolton, male    DOB: July 04, 1956, 62 y.o.   MRN: [id number]  HPI  Brandon Bolton is a 62 yo M here for routine visit and med refill.  HTN: BP stable tonight at 139/76. He reports he ran out of meds 2 days ago.  COPD: he denies exacerbations but endorses congestion from allergies. He reports he has been out of Symbicort and Zyrtec.   Patient Active Problem List   Diagnosis Date Noted  . Low hemoglobin 02/20/2018  . Edema extremities 10/05/2017  . Cellulitis and abscess of toe of right foot 11/24/2016  . Essential hypertension 10/04/2016  . Mixed hyperlipidemia 09/06/2016  . Chronic obstructive pulmonary disease (HCC) 09/06/2016   Allergies as of 06/14/2018   No Known Allergies     Medication List        Accurate as of 06/14/18  7:41 PM. Always use your most recent med list.          amLODipine 10 MG tablet Commonly known as:  NORVASC Take 1 tablet (10 mg total) by mouth daily.   atorvastatin 10 MG tablet Commonly known as:  LIPITOR Take 1 tablet (10 mg total) by mouth daily.   budesonide-formoterol 160-4.5 MCG/ACT inhaler Commonly known as:  SYMBICORT Inhale 2 puffs into the lungs 2 (two) times daily.   cetirizine 10 MG tablet Commonly known as:  ZYRTEC Take 1 tablet (10 mg total) by mouth daily.   diphenhydrAMINE 25 mg capsule Commonly known as:  BENADRYL Take 25 mg by mouth every 6 (six) hours as needed.   gabapentin 100 MG capsule Commonly known as:  NEURONTIN Take 1 capsule (100 mg total) by mouth 2 (two) times daily.   meloxicam 15 MG tablet Commonly known as:  MOBIC TAKE 1 TABLET BY MOUTH ONCE DAILY AS NEEDED FOR PAIN   sulindac 150 MG tablet Commonly known as:  CLINORIL Take 150 mg by mouth 2 (two) times daily.   VENTOLIN HFA 108 (90 Base) MCG/ACT inhaler Generic drug:  albuterol INHALE 2 PUFFS BY MOUTH EVERY 6 HOURS AS NEEDED FOR WHEEZING OR SHORTNESS OF BREATH       Review of Systems     Objective:   Physical  Exam  Constitutional: He is oriented to person, place, and time. He appears well-developed and well-nourished.  Cardiovascular: Normal rate, regular rhythm and normal heart sounds.  Pulmonary/Chest: Effort normal and breath sounds normal.  Abdominal: Soft. Bowel sounds are normal.  Neurological: He is alert and oriented to person, place, and time.  Skin: Skin is warm and dry.  Psychiatric: He has a normal mood and affect. His behavior is normal. Judgment and thought content normal.  Vitals reviewed.   BP 139/76 (BP Location: Left Arm, Patient Position: Sitting)   Pulse 96   Temp 98.2 F (36.8 C)   Ht 5\' 9"  (1.753 m)   Wt 148 lb 1.6 oz (67.2 kg)   BMI 21.87 kg/m        Assessment & Plan:   Refilled all meds.   HTN: Controlled.  Continue current therapy.    COPD: Continue current therapy.  Monitor for exacerbations.   HLD: Continue current therapy.    F/u in 4 mo for healthcare maintenance w/ labs 1 week prior.

## 2018-06-19 ENCOUNTER — Ambulatory Visit: Payer: Medicaid Other

## 2018-06-21 ENCOUNTER — Ambulatory Visit: Payer: Medicaid Other

## 2018-06-21 ENCOUNTER — Encounter: Payer: Self-pay | Admitting: Licensed Clinical Social Worker

## 2018-07-04 ENCOUNTER — Other Ambulatory Visit: Payer: Self-pay

## 2018-07-04 DIAGNOSIS — J438 Other emphysema: Secondary | ICD-10-CM

## 2018-07-04 MED ORDER — VENTOLIN HFA 108 (90 BASE) MCG/ACT IN AERS: INHALATION_SPRAY | RESPIRATORY_TRACT | 3 refills | Status: DC

## 2018-10-04 ENCOUNTER — Other Ambulatory Visit: Payer: Medicaid Other

## 2018-10-11 ENCOUNTER — Ambulatory Visit: Payer: Medicaid Other

## 2018-10-15 ENCOUNTER — Other Ambulatory Visit: Payer: Self-pay

## 2018-10-15 DIAGNOSIS — J438 Other emphysema: Secondary | ICD-10-CM

## 2018-10-15 MED ORDER — AMLODIPINE BESYLATE 10 MG PO TABS: 10.0000 mg | ORAL_TABLET | Freq: Every day | ORAL | 1 refills | Status: DC

## 2018-10-15 MED ORDER — ATORVASTATIN CALCIUM 10 MG PO TABS: 10.0000 mg | ORAL_TABLET | Freq: Every day | ORAL | 3 refills | Status: DC

## 2018-10-15 MED ORDER — BUDESONIDE-FORMOTEROL FUMARATE 160-4.5 MCG/ACT IN AERO: 2.0000 | INHALATION_SPRAY | Freq: Two times a day (BID) | RESPIRATORY_TRACT | 3 refills | Status: DC

## 2018-10-15 MED ORDER — VENTOLIN HFA 108 (90 BASE) MCG/ACT IN AERS: INHALATION_SPRAY | RESPIRATORY_TRACT | 3 refills | Status: DC

## 2018-10-15 MED ORDER — CETIRIZINE HCL 10 MG PO TABS: 10.0000 mg | ORAL_TABLET | Freq: Every day | ORAL | 3 refills | Status: DC

## 2018-10-15 MED ORDER — MELOXICAM 15 MG PO TABS: ORAL_TABLET | ORAL | 3 refills | Status: DC

## 2018-11-15 ENCOUNTER — Other Ambulatory Visit: Payer: Medicaid Other

## 2018-11-21 ENCOUNTER — Other Ambulatory Visit: Payer: Medicaid Other

## 2018-11-21 DIAGNOSIS — E782 Mixed hyperlipidemia: Secondary | ICD-10-CM

## 2018-11-22 ENCOUNTER — Ambulatory Visit: Payer: Medicaid Other

## 2018-11-22 LAB — COMPREHENSIVE METABOLIC PANEL
A/G RATIO: 2.3 — AB (ref 1.2–2.2)
ALT: 78 IU/L — ABNORMAL HIGH (ref 0–44)
AST: 88 IU/L — ABNORMAL HIGH (ref 0–40)
Albumin: 5 g/dL — ABNORMAL HIGH (ref 3.8–4.8)
Alkaline Phosphatase: 73 IU/L (ref 39–117)
BUN/Creatinine Ratio: 8 — ABNORMAL LOW (ref 10–24)
BUN: 6 mg/dL — ABNORMAL LOW (ref 8–27)
Bilirubin Total: 0.3 mg/dL (ref 0.0–1.2)
CO2: 21 mmol/L (ref 20–29)
Calcium: 9.5 mg/dL (ref 8.6–10.2)
Chloride: 90 mmol/L — ABNORMAL LOW (ref 96–106)
Creatinine, Ser: 0.76 mg/dL (ref 0.76–1.27)
GFR calc Af Amer: 113 mL/min/{1.73_m2} (ref >59)
GFR calc non Af Amer: 98 mL/min/{1.73_m2} (ref >59)
Globulin, Total: 2.2 g/dL (ref 1.5–4.5)
Glucose: 104 mg/dL — ABNORMAL HIGH (ref 65–99)
Potassium: 5 mmol/L (ref 3.5–5.2)
Sodium: 133 mmol/L — ABNORMAL LOW (ref 134–144)
Total Protein: 7.2 g/dL (ref 6.0–8.5)

## 2018-11-22 LAB — LIPID PANEL
CHOL/HDL RATIO: 1.5 ratio (ref 0.0–5.0)
Cholesterol, Total: 214 mg/dL — ABNORMAL HIGH (ref 100–199)
HDL: 145 mg/dL (ref >39)
LDL Calculated: 53 mg/dL (ref 0–99)
Triglycerides: 80 mg/dL (ref 0–149)
VLDL Cholesterol Cal: 16 mg/dL (ref 5–40)

## 2018-11-27 ENCOUNTER — Other Ambulatory Visit: Payer: Self-pay

## 2018-11-27 ENCOUNTER — Encounter: Payer: Self-pay | Admitting: Gerontology

## 2018-11-27 ENCOUNTER — Ambulatory Visit: Payer: Medicaid Other | Admitting: Gerontology

## 2018-11-27 VITALS — BP 135/72 | HR 88 | Ht 69.0 in | Wt 155.7 lb

## 2018-11-27 DIAGNOSIS — R748 Abnormal levels of other serum enzymes: Secondary | ICD-10-CM

## 2018-11-27 DIAGNOSIS — J449 Chronic obstructive pulmonary disease, unspecified: Secondary | ICD-10-CM

## 2018-11-27 DIAGNOSIS — I10 Essential (primary) hypertension: Secondary | ICD-10-CM

## 2018-11-27 DIAGNOSIS — H60502 Unspecified acute noninfective otitis externa, left ear: Secondary | ICD-10-CM

## 2018-11-27 DIAGNOSIS — Z0000 Encounter for general adult medical examination without abnormal findings: Secondary | ICD-10-CM

## 2018-11-27 DIAGNOSIS — R252 Cramp and spasm: Secondary | ICD-10-CM

## 2018-11-27 MED ORDER — AMLODIPINE BESYLATE 10 MG PO TABS: 10.0000 mg | ORAL_TABLET | Freq: Every day | ORAL | 2 refills | Status: DC

## 2018-11-27 MED ORDER — NEOMYCIN-POLYMYXIN-HC 3.5-10000-1 OT SOLN: 4.0000 [drp] | Freq: Four times a day (QID) | OTIC | 0 refills | Status: DC

## 2018-11-27 NOTE — Patient Instructions (Signed)
-Stop taking 10 mg Atorvastatin  Fat and Cholesterol Restricted Eating Plan Getting too much fat and cholesterol in your diet may cause health problems. Choosing the right foods helps keep your fat and cholesterol at normal levels. This can keep you from getting certain diseases. Your doctor may recommend an eating plan that includes:  Total fat: ______% or less of total calories a day.  Saturated fat: ______% or less of total calories a day.  Cholesterol: less than _________mg a day.  Fiber: ______g a day. What are tips for following this plan? Meal planning  At meals, divide your plate into four equal parts: ? Fill one-half of your plate with vegetables and green salads. ? Fill one-fourth of your plate with whole grains. ? Fill one-fourth of your plate with low-fat (lean) protein foods.  Eat fish that is high in omega-3 fats at least two times a week. This includes mackerel, tuna, sardines, and salmon.  Eat foods that are high in fiber, such as whole grains, beans, apples, broccoli, carrots, peas, and barley. General tips   Work with your doctor to lose weight if you need to.  Avoid: ? Foods with added sugar. ? Fried foods. ? Foods with partially hydrogenated oils.  Limit alcohol intake to no more than 1 drink a day for nonpregnant women and 2 drinks a day for men. One drink equals 12 oz of beer, 5 oz of wine, or 1 oz of hard liquor. Reading food labels  Check food labels for: ? Trans fats. ? Partially hydrogenated oils. ? Saturated fat (g) in each serving. ? Cholesterol (mg) in each serving. ? Fiber (g) in each serving.  Choose foods with healthy fats, such as: ? Monounsaturated fats. ? Polyunsaturated fats. ? Omega-3 fats.  Choose grain products that have whole grains. Look for the word "whole" as the first word in the ingredient list. Cooking  Cook foods using low-fat methods. These include baking, boiling, grilling, and broiling.  Eat more home-cooked  foods. Eat at restaurants and buffets less often.  Avoid cooking using saturated fats, such as butter, cream, palm oil, palm kernel oil, and coconut oil. Recommended foods  Fruits  All fresh, canned (in natural juice), or frozen fruits. Vegetables  Fresh or frozen vegetables (raw, steamed, roasted, or grilled). Green salads. Grains  Whole grains, such as whole wheat or whole grain breads, crackers, cereals, and pasta. Unsweetened oatmeal, bulgur, barley, quinoa, or brown rice. Corn or whole wheat flour tortillas. Meats and other protein foods  Ground beef (85% or leaner), grass-fed beef, or beef trimmed of fat. Skinless chicken or [place]. Ground chicken or [place]. Pork trimmed of fat. All fish and seafood. Egg whites. Dried beans, peas, or lentils. Unsalted nuts or seeds. Unsalted canned beans. Nut butters without added sugar or oil. Dairy  Low-fat or nonfat dairy products, such as skim or 1% milk, 2% or reduced-fat cheeses, low-fat and fat-free ricotta or cottage cheese, or plain low-fat and nonfat yogurt. Fats and oils  Tub margarine without trans fats. Light or reduced-fat mayonnaise and salad dressings. Avocado. Olive, canola, sesame, or safflower oils. The items listed above may not be a complete list of foods and beverages you can eat. Contact a dietitian for more information. Foods to avoid Fruits  Canned fruit in heavy syrup. Fruit in cream or butter sauce. Fried fruit. Vegetables  Vegetables cooked in cheese, cream, or butter sauce. Fried vegetables. Grains  White bread. White pasta. White rice. Cornbread. Bagels, pastries, and croissants. Crackers and  snack foods that contain trans fat and hydrogenated oils. Meats and other protein foods  Fatty cuts of meat. Ribs, chicken wings, bacon, sausage, bologna, salami, chitterlings, fatback, hot dogs, bratwurst, and packaged lunch meats. Liver and organ meats. Whole eggs and egg yolks. Chicken and [place] with skin. Fried  meat. Dairy  Whole or 2% milk, cream, half-and-half, and cream cheese. Whole milk cheeses. Whole-fat or sweetened yogurt. Full-fat cheeses. Nondairy creamers and whipped toppings. Processed cheese, cheese spreads, and cheese curds. Beverages  Alcohol. Sugar-sweetened drinks such as sodas, lemonade, and fruit drinks. Fats and oils  Butter, stick margarine, lard, shortening, ghee, or bacon fat. Coconut, palm kernel, and palm oils. Sweets and desserts  Corn syrup, sugars, honey, and molasses. Candy. Jam and jelly. Syrup. Sweetened cereals. Cookies, pies, cakes, donuts, muffins, and ice cream. The items listed above may not be a complete list of foods and beverages you should avoid. Contact a dietitian for more information. Summary  Choosing the right foods helps keep your fat and cholesterol at normal levels. This can keep you from getting certain diseases.  At meals, fill one-half of your plate with vegetables and green salads.  Eat high-fiber foods, like whole grains, beans, apples, carrots, peas, and barley.  Limit added sugar, saturated fats, alcohol, and fried foods. This information is not intended to replace advice given to you by your health care provider. Make sure you discuss any questions you have with your health care provider. Document Released: 04/17/2012 Document Revised: 06/20/2018 Document Reviewed: 07/04/2017 Elsevier Interactive Patient Education  2019 [employer]

## 2018-11-27 NOTE — Progress Notes (Signed)
Patient: Brandon Bolton Male    DOB: 1956-04-07   63 y.o.   MRN: [id number] Visit Date: 11/27/2018  Today's Provider: Langston Reusing, NP   Chief Complaint  Patient presents with  . Follow-up    needs refill on medications   Subjective:    HPI   Brandon Bolton is 63 y/o male who presents for follow up on hypertension, COPD and medication refill. He reports taking 10 mg Amlodipine daily and denies checking his blood pressure at home. He reports that he smokes 1 pack of cigarette daily. He denies headache, chest pain, palpitation, light headedness and peripheral edema.  He reports having intermittent muscle cramps to stomach, left shoulder that has being going on for many years and working and stretching helps relieve cramps.  He reports that he's being out of his symbicort and has being having shortness of breath when he walks more than 4 blocks and uses Albuterol as needed. He reports that his copd is under control.  Elevated LFT: He reports drinking 2-3 bottles of beer 2 days ago, and he states that he drinks 1-2 beers occasionally 2-3 days during the week. He takes 10 mg Atorvastatin which he states that he's out. He reports muscle pain.  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  He c/o bilateral tinntus that has being going on for 2 weeks, he denies otalgia, discharge, fever, chill.   No Known Allergies Previous Medications   BUDESONIDE-FORMOTEROL (SYMBICORT) 160-4.5 MCG/ACT INHALER    Inhale 2 puffs into the lungs 2 (two) times daily.   CETIRIZINE (ZYRTEC ALLERGY) 10 MG TABLET    Take 1 tablet (10 mg total) by mouth daily.   DIPHENHYDRAMINE (BENADRYL) 25 MG CAPSULE    Take 25 mg by mouth every 6 (six) hours as needed.   MELOXICAM (MOBIC) 15 MG TABLET    Daily.   SULINDAC (CLINORIL) 150 MG TABLET    Take 150 mg by mouth 2 (two) times daily.   VENTOLIN HFA 108 (90 BASE) MCG/ACT INHALER    INHALE 2  PUFFS BY MOUTH EVERY 6 HOURS AS NEEDED FOR WHEEZING OR SHORTNESS OF BREATH    Review of Systems  Constitutional: Negative.   HENT: Positive for tinnitus (bilateral tinnitus ).   Eyes: Negative.   Respiratory: Negative.   Cardiovascular: Negative.   Gastrointestinal: Negative.   Genitourinary: Negative.   Musculoskeletal: Positive for myalgias (to left shoulder, legs).  Skin: Negative.   Neurological: Negative.   Psychiatric/Behavioral: Negative.     Social History   Tobacco Use  . Smoking status: Current Every Day Smoker    Packs/day: 1.00    Years: 40.00    Pack years: 40.00    Types: Cigarettes  . Smokeless tobacco: Never Used  Substance Use Topics  . Alcohol use: No    Frequency: Never    Comment: 2 quarts of beer/day   Objective:   BP 135/72 (BP Location: Left Arm, Patient Position: Sitting, Cuff Size: Large)   Pulse 88   Ht 5' 9"  (1.753 m)   Wt 155 lb 11.2 oz (70.6 kg)   SpO2 97%   BMI 22.99 kg/m   Physical Exam Constitutional:      Appearance: Normal appearance.  HENT:     Head: Normocephalic and atraumatic.     Right Ear: Hearing and external ear normal. No drainage or tenderness. No middle ear effusion. There is no impacted  cerumen. No foreign body. No mastoid tenderness.     Left Ear: Hearing and external ear normal. No drainage or tenderness.  No middle ear effusion. There is no impacted cerumen. No foreign body. No mastoid tenderness. Left ear erythematous TM: mild to left ear.     Mouth/Throat:     Mouth: Mucous membranes are moist.  Eyes:     Extraocular Movements: Extraocular movements intact.     Pupils: Pupils are equal, round, and reactive to light.  Neck:     Musculoskeletal: Normal range of motion.  Cardiovascular:     Rate and Rhythm: Normal rate and regular rhythm.     Pulses: Normal pulses.     Heart sounds: Normal heart sounds.  Pulmonary:     Effort: Pulmonary effort is normal.     Breath sounds: Normal breath sounds.  Abdominal:      General: Bowel sounds are normal.     Palpations: Abdomen is soft.  Musculoskeletal:        General: Deformity: right hand, 4th and 5th finger conntracted.  Skin:    General: Skin is warm and dry.  Neurological:     General: No focal deficit present.     Mental Status: He is alert and oriented to person, place, and time.  Psychiatric:        Mood and Affect: Mood normal.        Behavior: Behavior normal.        Thought Content: Thought content normal.        Judgment: Judgment normal.         Assessment & Plan:         1. Essential hypertension - His blood pressure is well controlled and he will continue on amlodipine. He was advised to continue on low salt diet and exercise 30 minutes daily. He was strongly advised on smoking cessation. - amLODipine (NORVASC) 10 MG tablet; Take 1 tablet (10 mg total) by mouth daily.  Dispense: 90 tablet; Refill: 2  2. Chronic obstructive pulmonary disease, unspecified COPD type ([place]) - His copd is well controlled and he will continue on current treatment regimen  3. Elevated liver enzymes - Labs drawn on 11/21/18, AST 88 and ALT 78, 10 mg Atorvastatin was discontinued and he was advised to discontinue alcohol consumption and he agrees to come back on 01/02/19 for LFT recheck.  - Comp Met (CMET); Future - Lipid Profile; Future  4. Muscle cramps - 10 mg Atorvastatin was discontinued, was advised to notify provider for worsening muscle cramps.  5. Acute otitis externa of left ear, unspecified type - He will start on Cortisporin otic - neomycin-polymyxin-hydrocortisone (CORTISPORIN) OTIC solution; Place 4 drops into the left ear 4 (four) times daily.  Dispense: 10 mL; Refill: 0  6. Health care maintenance  - HgB A1c - Urine Microalbumin w/creat. Ratio - Follow up on 01/02/19 ``   Niurka Benecke Jerold Coombe, NP   [facility]

## 2018-11-28 LAB — MICROALBUMIN / CREATININE URINE RATIO
Creatinine, Urine: 178.1 mg/dL
Microalb/Creat Ratio: 10 mg/g creat (ref 0–29)
Microalbumin, Urine: 17.1 ug/mL

## 2018-12-27 ENCOUNTER — Other Ambulatory Visit: Payer: Self-pay | Admitting: Adult Health Nurse Practitioner

## 2018-12-27 DIAGNOSIS — J438 Other emphysema: Secondary | ICD-10-CM

## 2019-01-02 ENCOUNTER — Other Ambulatory Visit: Payer: Medicaid Other

## 2019-01-02 ENCOUNTER — Ambulatory Visit: Payer: Medicaid Other | Admitting: Gerontology

## 2019-01-24 ENCOUNTER — Other Ambulatory Visit: Payer: Self-pay

## 2019-01-24 DIAGNOSIS — J438 Other emphysema: Secondary | ICD-10-CM

## 2019-01-24 MED ORDER — MELOXICAM 15 MG PO TABS: ORAL_TABLET | ORAL | 0 refills | Status: DC

## 2019-01-24 MED ORDER — SYMBICORT 160-4.5 MCG/ACT IN AERO: 2.0000 | INHALATION_SPRAY | Freq: Two times a day (BID) | RESPIRATORY_TRACT | 0 refills | Status: DC

## 2019-01-24 MED ORDER — CETIRIZINE HCL 10 MG PO TABS: 10.0000 mg | ORAL_TABLET | Freq: Every day | ORAL | 1 refills | Status: DC

## 2019-01-24 MED ORDER — VENTOLIN HFA 108 (90 BASE) MCG/ACT IN AERS: INHALATION_SPRAY | RESPIRATORY_TRACT | 0 refills | Status: DC

## 2019-02-28 ENCOUNTER — Other Ambulatory Visit: Payer: Self-pay

## 2019-02-28 DIAGNOSIS — J438 Other emphysema: Secondary | ICD-10-CM

## 2019-02-28 MED ORDER — SYMBICORT 160-4.5 MCG/ACT IN AERO: 2.0000 | INHALATION_SPRAY | Freq: Two times a day (BID) | RESPIRATORY_TRACT | 1 refills | Status: DC

## 2019-02-28 MED ORDER — MELOXICAM 15 MG PO TABS: ORAL_TABLET | ORAL | 1 refills | Status: DC

## 2019-02-28 MED ORDER — VENTOLIN HFA 108 (90 BASE) MCG/ACT IN AERS: INHALATION_SPRAY | RESPIRATORY_TRACT | 1 refills | Status: DC

## 2019-03-05 ENCOUNTER — Inpatient Hospital Stay
Admission: EM | Admit: 2019-03-05 | Discharge: 2019-03-11 | DRG: 871 | Disposition: A | Discharge status: Home / Self Care | Payer: Medicaid Other | Attending: Internal Medicine | Admitting: Internal Medicine

## 2019-03-05 ENCOUNTER — Encounter: Payer: Self-pay | Admitting: *Deleted

## 2019-03-05 ENCOUNTER — Emergency Department: Payer: Medicaid Other

## 2019-03-05 ENCOUNTER — Other Ambulatory Visit: Payer: Self-pay

## 2019-03-05 DIAGNOSIS — J9601 Acute respiratory failure with hypoxia: Secondary | ICD-10-CM | POA: Diagnosis present

## 2019-03-05 DIAGNOSIS — A419 Sepsis, unspecified organism: Secondary | ICD-10-CM | POA: Diagnosis present

## 2019-03-05 DIAGNOSIS — M720 Palmar fascial fibromatosis [Dupuytren]: Secondary | ICD-10-CM | POA: Diagnosis present

## 2019-03-05 DIAGNOSIS — F17210 Nicotine dependence, cigarettes, uncomplicated: Secondary | ICD-10-CM | POA: Diagnosis present

## 2019-03-05 DIAGNOSIS — I447 Left bundle-branch block, unspecified: Secondary | ICD-10-CM | POA: Diagnosis present

## 2019-03-05 DIAGNOSIS — Z809 Family history of malignant neoplasm, unspecified: Secondary | ICD-10-CM

## 2019-03-05 DIAGNOSIS — E782 Mixed hyperlipidemia: Secondary | ICD-10-CM | POA: Diagnosis present

## 2019-03-05 DIAGNOSIS — Z20828 Contact with and (suspected) exposure to other viral communicable diseases: Secondary | ICD-10-CM | POA: Diagnosis present

## 2019-03-05 DIAGNOSIS — Z79899 Other long term (current) drug therapy: Secondary | ICD-10-CM

## 2019-03-05 DIAGNOSIS — J438 Other emphysema: Secondary | ICD-10-CM

## 2019-03-05 DIAGNOSIS — Z7951 Long term (current) use of inhaled steroids: Secondary | ICD-10-CM | POA: Diagnosis not present

## 2019-03-05 DIAGNOSIS — E871 Hypo-osmolality and hyponatremia: Secondary | ICD-10-CM | POA: Diagnosis present

## 2019-03-05 DIAGNOSIS — Z8249 Family history of ischemic heart disease and other diseases of the circulatory system: Secondary | ICD-10-CM | POA: Diagnosis not present

## 2019-03-05 DIAGNOSIS — R0902 Hypoxemia: Secondary | ICD-10-CM

## 2019-03-05 DIAGNOSIS — I10 Essential (primary) hypertension: Secondary | ICD-10-CM | POA: Diagnosis present

## 2019-03-05 DIAGNOSIS — Z8261 Family history of arthritis: Secondary | ICD-10-CM

## 2019-03-05 DIAGNOSIS — J9602 Acute respiratory failure with hypercapnia: Secondary | ICD-10-CM | POA: Diagnosis present

## 2019-03-05 DIAGNOSIS — J440 Chronic obstructive pulmonary disease with acute lower respiratory infection: Secondary | ICD-10-CM | POA: Diagnosis present

## 2019-03-05 DIAGNOSIS — J189 Pneumonia, unspecified organism: Secondary | ICD-10-CM | POA: Diagnosis present

## 2019-03-05 DIAGNOSIS — G4700 Insomnia, unspecified: Secondary | ICD-10-CM | POA: Diagnosis present

## 2019-03-05 DIAGNOSIS — I248 Other forms of acute ischemic heart disease: Secondary | ICD-10-CM | POA: Diagnosis present

## 2019-03-05 DIAGNOSIS — J441 Chronic obstructive pulmonary disease with (acute) exacerbation: Secondary | ICD-10-CM | POA: Diagnosis not present

## 2019-03-05 DIAGNOSIS — I340 Nonrheumatic mitral (valve) insufficiency: Secondary | ICD-10-CM | POA: Diagnosis not present

## 2019-03-05 HISTORY — DX: Alcohol abuse, uncomplicated: F10.10

## 2019-03-05 HISTORY — DX: Type 2 diabetes mellitus without complications: E11.9

## 2019-03-05 HISTORY — DX: Mixed hyperlipidemia: E78.2

## 2019-03-05 LAB — TROPONIN I
Troponin I: <0.03 ng/mL (ref <0.03)
Troponin I: 0.04 ng/mL (ref <0.03)

## 2019-03-05 LAB — CBC
HCT: 48.4 % (ref 39.0–52.0)
Hemoglobin: 16.6 g/dL (ref 13.0–17.0)
MCH: 33.9 pg (ref 26.0–34.0)
MCHC: 34.3 g/dL (ref 30.0–36.0)
MCV: 98.8 fL (ref 80.0–100.0)
Platelets: 424 10*3/uL — ABNORMAL HIGH (ref 150–400)
RBC: 4.9 MIL/uL (ref 4.22–5.81)
RDW: 14 % (ref 11.5–15.5)
WBC: 14.5 10*3/uL — ABNORMAL HIGH (ref 4.0–10.5)
nRBC: 0 % (ref 0.0–0.2)

## 2019-03-05 LAB — BLOOD GAS, ARTERIAL
Acid-base deficit: 6.9 mmol/L — ABNORMAL HIGH (ref 0.0–2.0)
Acid-base deficit: 4.8 mmol/L — ABNORMAL HIGH (ref 0.0–2.0)
Bicarbonate: 23.6 mmol/L (ref 20.0–28.0)
Bicarbonate: 22.1 mmol/L (ref 20.0–28.0)
Delivery systems: POSITIVE
Delivery systems: POSITIVE
Expiratory PAP: 5
Expiratory PAP: 5
FIO2: 0.6
FIO2: 60
Inspiratory PAP: 12
Inspiratory PAP: 12
Mechanical Rate: 10
O2 Saturation: 99.1 %
O2 Saturation: 99.7 %
Patient temperature: 37
Patient temperature: 37
pCO2 arterial: 71 mmHg (ref 32.0–48.0)
pCO2 arterial: 47 mmHg (ref 32.0–48.0)
pH, Arterial: 7.13 — CL (ref 7.350–7.450)
pH, Arterial: 7.28 — ABNORMAL LOW (ref 7.350–7.450)
pO2, Arterial: 169 mmHg — ABNORMAL HIGH (ref 83.0–108.0)
pO2, Arterial: 219 mmHg — ABNORMAL HIGH (ref 83.0–108.0)

## 2019-03-05 LAB — BASIC METABOLIC PANEL
Anion gap: 14 (ref 5–15)
BUN: 7 mg/dL — ABNORMAL LOW (ref 8–23)
CO2: 20 mmol/L — ABNORMAL LOW (ref 22–32)
Calcium: 8.5 mg/dL — ABNORMAL LOW (ref 8.9–10.3)
Chloride: 97 mmol/L — ABNORMAL LOW (ref 98–111)
Creatinine, Ser: 1.04 mg/dL (ref 0.61–1.24)
GFR calc Af Amer: >60 mL/min (ref >60)
GFR calc non Af Amer: >60 mL/min (ref >60)
Glucose, Bld: 174 mg/dL — ABNORMAL HIGH (ref 70–99)
Potassium: 4.3 mmol/L (ref 3.5–5.1)
Sodium: 131 mmol/L — ABNORMAL LOW (ref 135–145)

## 2019-03-05 LAB — SARS CORONAVIRUS 2 BY RT PCR (HOSPITAL ORDER, PERFORMED IN ~~LOC~~ HOSPITAL LAB): SARS Coronavirus 2: NEGATIVE

## 2019-03-05 LAB — LACTIC ACID, PLASMA: Lactic Acid, Venous: 1.1 mmol/L (ref 0.5–1.9)

## 2019-03-05 MED ORDER — METHYLPREDNISOLONE SODIUM SUCC 125 MG IJ SOLR
125.0000 mg | Freq: Once | INTRAMUSCULAR | Status: AC
  Administered 2019-03-05: 125 mg via INTRAVENOUS

## 2019-03-05 MED ORDER — SYMBICORT 160-4.5 MCG/ACT IN AERO: 2.0000 | INHALATION_SPRAY | Freq: Two times a day (BID) | RESPIRATORY_TRACT | 1 refills | Status: AC

## 2019-03-05 MED ORDER — SODIUM CHLORIDE 0.9% FLUSH: 3.0000 mL | Freq: Once | INTRAVENOUS | Status: DC

## 2019-03-05 MED ORDER — BUDESONIDE 0.25 MG/2ML IN SUSP
0.2500 mg | Freq: Two times a day (BID) | RESPIRATORY_TRACT | Status: DC
  Administered 2019-03-06 – 2019-03-11 (×12): 0.25 mg via RESPIRATORY_TRACT
  Filled 2019-03-05 (×12): qty 2

## 2019-03-05 MED ORDER — IPRATROPIUM-ALBUTEROL 0.5-2.5 (3) MG/3ML IN SOLN
RESPIRATORY_TRACT | Status: AC
  Administered 2019-03-05: 3 mL
  Filled 2019-03-05: qty 9

## 2019-03-05 MED ORDER — METHYLPREDNISOLONE SODIUM SUCC 125 MG IJ SOLR
INTRAMUSCULAR | Status: AC
  Filled 2019-03-05: qty 2

## 2019-03-05 MED ORDER — AMLODIPINE BESYLATE 10 MG PO TABS: 10.0000 mg | ORAL_TABLET | Freq: Every day | ORAL | 2 refills | Status: AC

## 2019-03-05 MED ORDER — SODIUM CHLORIDE 0.9 % IV SOLN
500.0000 mg | Freq: Once | INTRAVENOUS | Status: AC
  Administered 2019-03-05: 500 mg via INTRAVENOUS
  Filled 2019-03-05: qty 500

## 2019-03-05 MED ORDER — VENTOLIN HFA 108 (90 BASE) MCG/ACT IN AERS: INHALATION_SPRAY | RESPIRATORY_TRACT | 1 refills | Status: DC

## 2019-03-05 MED ORDER — CEFTRIAXONE SODIUM 1 G IJ SOLR
1.0000 g | Freq: Once | INTRAMUSCULAR | Status: AC
Start: 2019-03-05 — End: 2019-03-06
  Administered 2019-03-05: 1 g via INTRAVENOUS
  Filled 2019-03-05: qty 10

## 2019-03-05 NOTE — Consult Note (Signed)
Name: Brandon Bolton MRN: [id number] DOB: 10-Aug-1956    ADMISSION DATE:  03/05/2019 CONSULTATION DATE:  03/05/2019  REFERRING MD :  Dr. Anne Hahn  CHIEF COMPLAINT:  Shortness of Breath  BRIEF PATIENT DESCRIPTION:  63 year old male with a past medical history notable for COPD, tobacco abuse, and drug abuse admitted with Acute Hypoxic Hypercapnic Respiratory Failure requiring BiPAP in the setting of acute COPD Exacerbation and suspected Community-Acquired Pneumonia, questionable component of pulmonary edema as well.  SIGNIFICANT EVENTS  03/05/2019>> admission to [facility] stepdown unit  STUDIES:  N/A  CULTURES: COVID-19 03/05/19>> NEGATIVE Blood x2 5/5>> Sputum 5/5>> Strep pneumo urinary antigen 5/5>> Legionella urinary antigen 5/5>>  ANTIBIOTICS: Azithromycin 5/5>> Rocephin 5/5>>  HISTORY OF PRESENT ILLNESS:   Brandon Bolton is a 63 year old male with a past medical history notable for COPD (no home O2 requirements), tobacco abuse, drug abuse, hypertension who presented to [facility] ED on 03/05/2019 due to significant respiratory distress.  On EMS arrival he was noted to be severely hypoxic with O2 sats 60% on room air.  Upon arrival to the ED he was placed on nonrebreather, and ultimately required BiPAP due to his respiratory distress. He reports that his shortness of breath has been progressive over the past week, exacerbated by exertion.  He denies any fever/chills, no change in cough or sputum production from baseline, and denies any sick contacts.  He does report edema to his bilateral lower extremities. In the ED he received 3 duo-nebs, 125 mg of Solu-Medrol.  COVID-19 test is negative.  Initial work-up in the ED reveals sodium 131, bicarb 20, troponin less than 0.03, WBC 14.5, lactic acid 1.1.  ABG with pH 7.13 /CO2 71 /PO2 169 /bicarb 23.6.  Chest x-Glosser reveals diffuse interstitial lung markings bilaterally concerning for pulmonary edema versus atypical infection.  He is being admitted to [facility] stepdown unit  for treatment of Acute Hypoxic Hypercapnic Respiratory Failure requiring BiPAP in the setting of Acute COPD Exacerbation and suspected Community-Acquired Pneumonia, with questionable component of pulmonary edema as well.  PCCM is consulted for further management.  PAST MEDICAL HISTORY :   has a past medical history of COPD (chronic obstructive pulmonary disease) (HCC), Drug abuse (HCC), Dupuytren's contracture, and Hypertension.  has a past surgical history that includes Hand surgery. Prior to Admission medications   Medication Sig Start Date End Date Taking? Authorizing Provider  amLODipine (NORVASC) 10 MG tablet Take 1 tablet (10 mg total) by mouth daily. 03/05/19  Yes Doles-Johnson, Teah, NP  cetirizine (ZYRTEC) 10 MG tablet Take 1 tablet (10 mg total) by mouth daily. 01/24/19  Yes Doles-Johnson, Teah, NP  diphenhydrAMINE (BENADRYL) 25 mg capsule Take 25 mg by mouth every 6 (six) hours as needed for itching, allergies or sleep.    Yes [provider]  meloxicam (MOBIC) 15 MG tablet Daily. Patient taking differently: Take 15 mg by mouth daily.  02/28/19  Yes Virl Axe, MD  SYMBICORT 160-4.5 MCG/ACT inhaler Inhale 2 puffs into the lungs 2 (two) times daily. 03/05/19  Yes Doles-Johnson, Teah, NP  VENTOLIN HFA 108 (90 Base) MCG/ACT inhaler INHALE 2 PUFFS BY MOUTH EVERY 6 HOURS AS NEEDED FOR WHEEZING OR SHORTNESS OF BREATH Patient taking differently: Inhale 2 puffs into the lungs every 6 (six) hours as needed for wheezing or shortness of breath.  03/05/19  Yes Doles-Johnson, Teah, NP  neomycin-polymyxin-hydrocortisone (CORTISPORIN) OTIC solution Place 4 drops into the left ear 4 (four) times daily. Patient not taking: Reported on 03/05/2019 11/27/18   Iloabachie,  Chioma E, NP   No Known Allergies  FAMILY HISTORY:  family history includes Allergic Disorder in his mother; Arthritis in his father; Cancer in his father; Heart disease in his father; Hypertension in his father. SOCIAL HISTORY:   reports that he has been smoking cigarettes. He has a 40.00 pack-year smoking history. He has never used smokeless tobacco. He reports that he does not drink alcohol or use drugs.   REVIEW OF SYSTEMS: Positives in BOLD Constitutional: Negative for fever, chills, weight loss, malaise/fatigue and diaphoresis.  HENT: Negative for hearing loss, ear pain, nosebleeds, congestion, sore throat, neck pain, tinnitus and ear discharge.   Eyes: Negative for blurred vision, double vision, photophobia, pain, discharge and redness.  Respiratory: Negative for +cough, hemoptysis, sputum production, +shortness of breath, +wheezing and stridor.   Cardiovascular: Negative for chest pain, palpitations, orthopnea, claudication, +leg swelling and PND.  Gastrointestinal: Negative for heartburn, nausea, vomiting, abdominal pain, diarrhea, constipation, blood in stool and melena.  Genitourinary: Negative for dysuria, urgency, frequency, hematuria and flank pain.  Musculoskeletal: Negative for myalgias, back pain, joint pain and falls.  Skin: Negative for itching and rash.  Neurological: Negative for dizziness, tingling, tremors, sensory change, speech change, focal weakness, seizures, loss of consciousness, weakness and headaches.  Endo/Heme/Allergies: Negative for environmental allergies and polydipsia. Does not bruise/bleed easily.  SUBJECTIVE:  Pt reports mild shortness of breath since being transferred/moved from ED, but that it is greatly improved since presenting to ED Also reports mild wheezing on his left side, reports edema to bilateral LE Denies fever/chills Tolerating BiPAP (FiO2 35%)  VITAL SIGNS: Temp:  [97.6 F (36.4 C)] 97.6 F (36.4 C) (05/05 1802) Pulse Rate:  [91-117] 96 (05/05 2242) Resp:  [17-36] 19 (05/05 2242) BP: (132-154)/(67-97) 140/96 (05/05 2230) SpO2:  [95 %-100 %] 99 % (05/05 2242) Weight:  [72.6 kg] 72.6 kg (05/05 1803)  PHYSICAL EXAMINATION: General: Acutely ill-appearing male,  sitting in bed, on BiPAP, in no acute distress Neuro: Awake, alert and oriented x4, follows commands, speech clear, no focal deficits HEENT: Atraumatic, normocephalic, neck supple, no JVD Cardiovascular: Tachycardia, regular rhythm, no murmurs rubs or gallops, 2+ pulses throughout Lungs: Slight expiratory wheeze noted to auscultation bilaterally, fine crackles to bilateral bases; even, BiPAP assisted, nonlabored, no accessory muscle use Abdomen: Soft, nontender, nondistended, no guarding or rebound tenderness, bowel sounds positive x4 Musculoskeletal: Normal bulk and tone, no deformities, 1+ edema bilateral lower extremities Skin: Warm and dry, no obvious rashes lesions or ulcerations  Recent Labs  Lab 03/05/19 1810  NA 131*  K 4.3  CL 97*  CO2 20*  BUN 7*  CREATININE 1.04  GLUCOSE 174*   Recent Labs  Lab 03/05/19 1810  HGB 16.6  HCT 48.4  WBC 14.5*  PLT 424*   Dg Chest Portable 1 View  Result Date: 03/05/2019 CLINICAL DATA:  Shortness of breath EXAM: PORTABLE CHEST 1 VIEW COMPARISON:  Prior chest x-rays could not be retrieved for comparison. FINDINGS: The cardiac silhouette is mildly enlarged. There are diffuse prominent interstitial lung markings bilaterally. There is fullness of the perihilar regions bilaterally. There is some mild blunting of the costophrenic angles bilaterally. No pneumothorax. Emphysematous changes are noted bilaterally. No displaced fracture. IMPRESSION: Diffuse interstitial lung markings bilaterally may represent developing pulmonary edema or an atypical infectious process. Electronically Signed   By: Katherine Mantlehristopher  Green M.D.   On: 03/05/2019 18:14    ASSESSMENT / PLAN:  Acute Hypoxic Hypercapnic Respiratory Failure in the setting of AECOPD & CAP,  ?  component of pulmonary edema Hx: COPD, tobacco abuse -Supplemental O2 as needed to maintain O2 sats 88 to 94% -BiPAP, wean as tolerated -Follow intermittent ABG and chest x-Ellegood as needed -Scheduled  bronchodilators -IV Solu-Medrol & budesonide nebs -Continue IV azithromycin and Rocephin -Check BNP -Give one-time dose 20 mg IV Lasix -Nicotine patch -Encourage smoking cessation  Sepsis secondary to Community-acquired pneumonia -Monitor fever curve -Trend WBCs and procalcitonin -Follow cultures as above -Continue azithromycin and Rocephin  -COVID-19 negative  -Trend Lactic acid>> normal lactic acid  Mildly elevated troponin, likely demand ischemia in setting of acute respiratory failure -Cardiac monitoring -Maintain MAP greater than 65 -Trend troponin      Disposition: Stepdown Goals of care: Full code VTE prophylaxis: Lovenox SQ Updates: Updated patient at bedside 03/06/2019  Harlon Ditty, [facility] Pager: [phone] Cell: [phone]  03/05/2019, 11:13 PM

## 2019-03-05 NOTE — ED Notes (Addendum)
Lt grn tube sent to lab. Pt requesting warm blankets--given to pt. Pt given pillow and repositioned. Asked if pt would like this RN to update anyone; pt declined.

## 2019-03-05 NOTE — ED Notes (Signed)
Report off to georgie rn  

## 2019-03-05 NOTE — ED Provider Notes (Signed)
[facility] Emergency Department Provider Note  Time seen: 6:31 PM  I have reviewed the triage vital signs and the nursing notes.   HISTORY  Chief Complaint Shortness of Breath   HPI Brandon Bolton is a 63 y.o. male with a past medical history of COPD, drug abuse, presents to the emergency department for significant respiratory distress.   Patient has a long history of COPD, smells heavily of cigarette smoke.  EMS states upon their arrival patient satting 60% on room air.  No home O2 baseline requirement.  Upon arrival to the emergency department patient was transitioned to a nonrebreather mask upon arrival, satting around 90% on 15 L on nonrebreather.  No fever.  Patient states normal cough but denies any increased cough.  Denies any chest pain.  Patient's temperature is 97.6, called for a stat portable chest x-Favela to evaluate.  Past Medical History:  Diagnosis Date  . COPD (chronic obstructive pulmonary disease) (HCC)   . Drug abuse (HCC)   . Dupuytren's contracture   . Hypertension     Patient Active Problem List   Diagnosis Date Noted  . Low hemoglobin 02/20/2018  . Edema extremities 10/05/2017  . Cellulitis and abscess of toe of right foot 11/24/2016  . Essential hypertension 10/04/2016  . Mixed hyperlipidemia 09/06/2016  . Chronic obstructive pulmonary disease (HCC) 09/06/2016    Past Surgical History:  Procedure Laterality Date  . HAND SURGERY      Prior to Admission medications   Medication Sig Start Date End Date Taking? Authorizing Provider  amLODipine (NORVASC) 10 MG tablet Take 1 tablet (10 mg total) by mouth daily. 03/05/19   Doles-Johnson, Teah, NP  cetirizine (ZYRTEC) 10 MG tablet Take 1 tablet (10 mg total) by mouth daily. 01/24/19   Doles-Johnson, Teah, NP  diphenhydrAMINE (BENADRYL) 25 mg capsule Take 25 mg by mouth every 6 (six) hours as needed.    [provider]  meloxicam (MOBIC) 15 MG tablet Daily. 02/28/19   Virl Axehaplin, Don C,  MD  neomycin-polymyxin-hydrocortisone (CORTISPORIN) OTIC solution Place 4 drops into the left ear 4 (four) times daily. 11/27/18   Iloabachie, Chioma E, NP  sulindac (CLINORIL) 150 MG tablet Take 150 mg by mouth 2 (two) times daily.    [provider]  SYMBICORT 160-4.5 MCG/ACT inhaler Inhale 2 puffs into the lungs 2 (two) times daily. 03/05/19   Doles-Johnson, Teah, NP  VENTOLIN HFA 108 (90 Base) MCG/ACT inhaler INHALE 2 PUFFS BY MOUTH EVERY 6 HOURS AS NEEDED FOR WHEEZING OR SHORTNESS OF BREATH 03/05/19   Doles-Johnson, Teah, NP    No Known Allergies  Family History  Problem Relation Age of Onset  . Hypertension Father   . Heart disease Father   . Arthritis Father   . Cancer Father   . Allergic Disorder Mother     Social History Social History   Tobacco Use  . Smoking status: Current Every Day Smoker    Packs/day: 1.00    Years: 40.00    Pack years: 40.00    Types: Cigarettes  . Smokeless tobacco: Never Used  Substance Use Topics  . Alcohol use: No    Frequency: Never    Comment: 2 quarts of beer/day  . Drug use: No    Types: Heroin    Comment: former heroin user (inj)    Review of Systems Constitutional: Negative for fever. Cardiovascular: Negative for chest pain. Respiratory: Severe shortness of breath.  Positive for cough, chronic per patient. Gastrointestinal: Negative for  abdominal pain Musculoskeletal: Negative for musculoskeletal complaints Neurological: Negative for headache All other ROS negative  ____________________________________________   PHYSICAL EXAM:  VITAL SIGNS: ED Triage Vitals  Enc Vitals Group     BP 03/05/19 1802 (!) 141/93     Pulse Rate 03/05/19 1802 (!) 117     Resp 03/05/19 1802 (!) 36     Temp 03/05/19 1802 97.6 F (36.4 C)     Temp Source 03/05/19 1802 Oral     SpO2 03/05/19 1802 97 %     Weight 03/05/19 1803 160 lb (72.6 kg)     Height 03/05/19 1803 5\' 9"  (1.753 m)     Head Circumference --      Peak Flow --      Pain  Score 03/05/19 1809 0     Pain Loc --      Pain Edu? --      Excl. in GC? --    Constitutional: Patient is awake and alert, severe respiratory distress tripod position upright in bed. Eyes: Normal exam ENT      Head: Normocephalic and atraumatic.      Mouth/Throat: Mucous membranes are moist. Cardiovascular: Normal rate, regular rhythm. Respiratory: Patient has severe respiratory distress significant tachypnea, extremely diminished breath sounds bilaterally with very minimal wheeze.  Diffuse accessory muscle usage. Gastrointestinal: Soft and nontender. No distention.   Musculoskeletal: Nontender with normal range of motion in all extremities.  Neurologic:  Normal speech and language. No gross focal neurologic deficits Skin:  Skin is warm.  Diaphoretic. Psychiatric: Mood and affect are normal.   ____________________________________________    EKG  EKG viewed and interpreted by myself shows sinus tachycardia 115 bpm minute with a widened QRS, normal axis, morphology most consistent with left bundle branch block.  Nonspecific ST changes but fairly significant ST depressions in the lateral leads.  No old EKG for comparison.  ____________________________________________    RADIOLOGY  X-Wynia shows interstitial lung markings consistent with edema versus infectious process.  ____________________________________________   INITIAL IMPRESSION / ASSESSMENT AND PLAN / ED COURSE  Pertinent labs & imaging results that were available during my care of the patient were reviewed by me and considered in my medical decision making (see chart for details).   Patient presents emergency department for severe shortness of breath.  Satting 60% on room air with no home O2 baseline requirement.  On nonrebreather mask after CPAP use by EMS only satting in the upper 80s to low 90s.  Temperature taken upon arrival, afebrile.  Stat portable chest x-[place]olsom does not appear to show concern for bilateral patchy  opacities.  Patient placed on BiPAP with viral filter.  Dose 3 duo nebs, 125 mg of Solu-Medrol.  Patient continues to have significant dyspnea, with significant accessory muscle usage.  Corona test has been sent.  We will check labs.  Patient is EKG shows what appears to be a left bundle branch block, there is no old EKG for comparison.  Patient denies any chest pain.  Patient's current test is finally resulted negative.  Patient's labs show significant acidosis with elevated PCO2, which is correcting now on BiPAP.  Patient appears much more comfortable.  Given the patient's chest x-Mangini findings we will cover with IV antibiotics and check blood cultures.  Patient will be admitted to the hospital service.  ADRITH ACKERMAN was evaluated in Emergency Department on 03/05/2019 for the symptoms described in the history of present illness. He was evaluated in the context of the global COVID-19  pandemic, which necessitated consideration that the patient might be at risk for infection with the SARS-CoV-2 virus that causes COVID-19. Institutional protocols and algorithms that pertain to the evaluation of patients at risk for COVID-19 are in a state of rapid change based on information released by regulatory bodies including the CDC and federal and state organizations. These policies and algorithms were followed during the patient's care in the ED.  CRITICAL CARE Performed by: Minna Antis   Total critical care time: 60 minutes  Critical care time was exclusive of separately billable procedures and treating other patients.  Critical care was necessary to treat or prevent imminent or life-threatening deterioration.  Critical care was time spent personally by me on the following activities: development of treatment plan with patient and/or surrogate as well as nursing, discussions with consultants, evaluation of patient's response to treatment, examination of patient, obtaining history from patient or surrogate,  ordering and performing treatments and interventions, ordering and review of laboratory studies, ordering and review of radiographic studies, pulse oximetry and re-evaluation of patient's condition.   ____________________________________________   FINAL CLINICAL IMPRESSION(S) / ED DIAGNOSES  Dyspnea COPD exacerbation Pneumonia   Minna Antis, MD 03/05/19 2206

## 2019-03-05 NOTE — ED Notes (Signed)
Will send recollect of light grn tube to lab as requested.

## 2019-03-05 NOTE — ED Notes (Addendum)
Pt brought in via ems from home.  Pt diaphoretic on arrival to treatment room.  Hx emphysema.  No chest pain. Sx for 1 week  Worse today  2 iv's in place on arrival  Pt on 100% nonrebreather. Pt denies cough or fevers.  cig smoker.   md and RT at bedside with 2 RN's  Sinus tach on monitor.

## 2019-03-05 NOTE — ED Notes (Signed)
Patient still has labored breathing, using accessory muscles, but rate has decreased to mid-20's. Patient states he is "scared." Patient able to be soothed with conversation. Patient was diaphoretic upon arrival, which has lessened at this time.

## 2019-03-05 NOTE — ED Notes (Signed)
Skin warm and dry now.  Rt at bedside.  Bi pap in place.

## 2019-03-05 NOTE — ED Notes (Addendum)
Called to notify RT of ABG ordered. Stated they will draw this soon. Timed for 8:30pm.

## 2019-03-05 NOTE — ED Triage Notes (Addendum)
Pt brought in via ems with sob. Hx emphysema   No chest pain.  Pt diaphoretic.   Pt alert.

## 2019-03-05 NOTE — H&P (Signed)
[facility] - [place] at [facility]   PATIENT NAME: Brandon Bolton    MR#:  [id number]  DATE OF BIRTH:  1956/03/16  DATE OF ADMISSION:  03/05/2019  PRIMARY CARE PHYSICIAN: Patient, No Pcp Per   REQUESTING/REFERRING PHYSICIAN: Paduchowski, MD  CHIEF COMPLAINT:   Chief Complaint  Patient presents with  . Shortness of Breath    HISTORY OF PRESENT ILLNESS:  Brandon Bolton  is a 63 y.o. male who presents with chief complaint as above.  Patient presents the ED obtunded and on CPAP.  He is in significant respiratory distress.  Here he was found to have pH of 7.13 with CO2 on ABG of 71.  He is also found to have likely pneumonia on chest x-[facility] imaging.  Patient improved significantly with BiPAP in terms of his mental status.  His CO2 came down to 47 and his pH improved to 7.28.  Hospitalist were called for admission  PAST MEDICAL HISTORY:   Past Medical History:  Diagnosis Date  . COPD (chronic obstructive pulmonary disease) (HCC)   . Drug abuse (HCC)   . Dupuytren's contracture   . Hypertension      PAST SURGICAL HISTORY:   Past Surgical History:  Procedure Laterality Date  . HAND SURGERY       SOCIAL HISTORY:   Social History   Tobacco Use  . Smoking status: Current Every Day Smoker    Packs/day: 1.00    Years: 40.00    Pack years: 40.00    Types: Cigarettes  . Smokeless tobacco: Never Used  Substance Use Topics  . Alcohol use: No    Frequency: Never    Comment: 2 quarts of beer/day     FAMILY HISTORY:   Family History  Problem Relation Age of Onset  . Hypertension Father   . Heart disease Father   . Arthritis Father   . Cancer Father   . Allergic Disorder Mother      DRUG ALLERGIES:  No Known Allergies  MEDICATIONS AT HOME:   Prior to Admission medications   Medication Sig Start Date End Date Taking? Authorizing Provider  amLODipine (NORVASC) 10 MG tablet Take 1 tablet (10 mg total) by mouth daily. 03/05/19  Yes Doles-Johnson, Teah,  NP  cetirizine (ZYRTEC) 10 MG tablet Take 1 tablet (10 mg total) by mouth daily. 01/24/19  Yes Doles-Johnson, Teah, NP  diphenhydrAMINE (BENADRYL) 25 mg capsule Take 25 mg by mouth every 6 (six) hours as needed for itching, allergies or sleep.    Yes [provider]  meloxicam (MOBIC) 15 MG tablet Daily. Patient taking differently: Take 15 mg by mouth daily.  02/28/19  Yes Virl Axe, MD  SYMBICORT 160-4.5 MCG/ACT inhaler Inhale 2 puffs into the lungs 2 (two) times daily. 03/05/19  Yes Doles-Johnson, Teah, NP  VENTOLIN HFA 108 (90 Base) MCG/ACT inhaler INHALE 2 PUFFS BY MOUTH EVERY 6 HOURS AS NEEDED FOR WHEEZING OR SHORTNESS OF BREATH Patient taking differently: Inhale 2 puffs into the lungs every 6 (six) hours as needed for wheezing or shortness of breath.  03/05/19  Yes Doles-Johnson, Teah, NP  neomycin-polymyxin-hydrocortisone (CORTISPORIN) OTIC solution Place 4 drops into the left ear 4 (four) times daily. Patient not taking: Reported on 03/05/2019 11/27/18   Iloabachie, Chioma E, NP    REVIEW OF SYSTEMS:  Review of Systems  Constitutional: Positive for malaise/fatigue. Negative for chills, fever and weight loss.  HENT: Negative for ear pain, hearing loss and tinnitus.   Eyes: Negative for  blurred vision, double vision, pain and redness.  Respiratory: Positive for shortness of breath and wheezing. Negative for cough and hemoptysis.   Cardiovascular: Negative for chest pain, palpitations, orthopnea and leg swelling.  Gastrointestinal: Negative for abdominal pain, constipation, diarrhea, nausea and vomiting.  Genitourinary: Negative for dysuria, frequency and hematuria.  Musculoskeletal: Negative for back pain, joint pain and neck pain.  Skin:       No acne, rash, or lesions  Neurological: Negative for dizziness, tremors, focal weakness and weakness.  Endo/Heme/Allergies: Negative for polydipsia. Does not bruise/bleed easily.  Psychiatric/Behavioral: Negative for depression. The  patient is not nervous/anxious and does not have insomnia.      VITAL SIGNS:   Vitals:   03/05/19 2200 03/05/19 2215 03/05/19 2230 03/05/19 2242  BP: (!) 145/87  (!) 140/96   Pulse: 91  94 96  Resp: 18 17 (!) 22 19  Temp:      TempSrc:      SpO2: 100%  99% 99%  Weight:      Height:       Wt Readings from Last 3 Encounters:  03/05/19 72.6 kg  11/27/18 70.6 kg  06/14/18 67.2 kg    PHYSICAL EXAMINATION:  Physical Exam  Vitals reviewed. Constitutional: He is oriented to person, place, and time. He appears well-developed and well-nourished. No distress.  HENT:  Head: Normocephalic and atraumatic.  Mouth/Throat: Oropharynx is clear and moist.  Eyes: Pupils are equal, round, and reactive to light. Conjunctivae and EOM are normal. No scleral icterus.  Neck: Normal range of motion. Neck supple. No JVD present. No thyromegaly present.  Cardiovascular: Normal rate, regular rhythm and intact distal pulses. Exam reveals no gallop and no friction rub.  No murmur heard. Respiratory: He is in respiratory distress. He has wheezes. He has no rales.  GI: Soft. Bowel sounds are normal. He exhibits no distension. There is no abdominal tenderness.  Musculoskeletal: Normal range of motion.        General: No edema.     Comments: No arthritis, no gout  Lymphadenopathy:    He has no cervical adenopathy.  Neurological: He is alert and oriented to person, place, and time. No cranial nerve deficit.  No dysarthria, no aphasia  Skin: Skin is warm and dry. No rash noted. No erythema.  Psychiatric: He has a normal mood and affect. His behavior is normal. Judgment and thought content normal.    LABORATORY PANEL:   CBC Recent Labs  Lab 03/05/19 1810  WBC 14.5*  HGB 16.6  HCT 48.4  PLT 424*   ------------------------------------------------------------------------------------------------------------------  Chemistries  Recent Labs  Lab 03/05/19 1810  NA 131*  K 4.3  CL 97*  CO2 20*   GLUCOSE 174*  BUN 7*  CREATININE 1.04  CALCIUM 8.5*   ------------------------------------------------------------------------------------------------------------------  Cardiac Enzymes Recent Labs  Lab 03/05/19 2117  TROPONINI 0.04*   ------------------------------------------------------------------------------------------------------------------  RADIOLOGY:  Dg Chest Portable 1 View  Result Date: 03/05/2019 CLINICAL DATA:  Shortness of breath EXAM: PORTABLE CHEST 1 VIEW COMPARISON:  Prior chest x-rays could not be retrieved for comparison. FINDINGS: The cardiac silhouette is mildly enlarged. There are diffuse prominent interstitial lung markings bilaterally. There is fullness of the perihilar regions bilaterally. There is some mild blunting of the costophrenic angles bilaterally. No pneumothorax. Emphysematous changes are noted bilaterally. No displaced fracture. IMPRESSION: Diffuse interstitial lung markings bilaterally may represent developing pulmonary edema or an atypical infectious process. Electronically Signed   By: Katherine Mantlehristopher  Green M.D.   On: 03/05/2019 18:14  EKG:   Orders placed or performed during the hospital encounter of 03/05/19  . ED EKG within 10 minutes  . ED EKG within 10 minutes    IMPRESSION AND PLAN:  Principal Problem:   Acute respiratory failure with hypoxia and hypercapnia (HCC) -patient is on BiPAP, will continue this for now and admit to stepdown/ICU.  Other treatment as below Active Problems:   Sepsis (HCC) -IV antibiotics initiated, lactic acid within normal limits, blood pressure stable, sepsis is due to pneumonia as below, culture sent   CAP (community acquired pneumonia) -IV antibiotics initiated, supportive treatment as above and below   COPD exacerbation (HCC) -IV Solu-Medrol, PRN duo nebs and antitussive, continue home dose inhalers   Essential hypertension -continue home dose antihypertensives  Chart review performed and case discussed  with ED provider. Labs, imaging and/or ECG reviewed by provider and discussed with patient/family. Management plans discussed with the patient and/or family.  COVID-19 status: Tested negative     DVT PROPHYLAXIS: SubQ lovenox   GI PROPHYLAXIS:  None  ADMISSION STATUS: Inpatient     CODE STATUS: Full  TOTAL CRITICAL CARE TIME TAKING CARE OF THIS PATIENT: 50 minutes.   This patient was evaluated in the context of the global COVID-19 pandemic, which necessitated consideration that the patient might be at risk for infection with the SARS-CoV-2 virus that causes COVID-19. Institutional protocols and algorithms that pertain to the evaluation of patients at risk for COVID-19 are in a state of rapid change based on information released by regulatory bodies including the CDC and federal and state organizations. These policies and algorithms were followed to the best of this provider's knowledge to date during the patient's care at this facility.  Barney Drain 03/05/2019, 10:50 PM  [facility]  Office  [phone]  CC: Primary care physician; Patient, No Pcp Per  Note:  This document was prepared using Dragon voice recognition software and may include unintentional dictation errors.

## 2019-03-05 NOTE — ED Notes (Signed)
Revonda Standard, RN butterfly sticking for cultures.

## 2019-03-05 NOTE — ED Notes (Signed)
Neb given by RT on pt's arrival to room.

## 2019-03-05 NOTE — ED Notes (Signed)
TV volume turned up for pt.

## 2019-03-05 NOTE — ED Notes (Signed)
ED TO INPATIENT HANDOFF REPORT  ED Nurse Name and Phone #: Greta DoomGeorgie [phone]  S Name/Age/Gender Brandon Bolton 63 y.o. male Room/Bed: ED11A/ED11A  Code Status   Code Status: Not on file  Home/SNF/Other Home Patient oriented to: self, place, time and situation Is this baseline? Yes   Triage Complete: Triage complete  Chief Complaint Chest pain  Triage Note Pt brought in via ems with sob. Hx emphysema   No chest pain.  Pt diaphoretic.   Pt alert.      Allergies No Known Allergies  Level of Care/Admitting Diagnosis ED Disposition    None      B Medical/Surgery History Past Medical History:  Diagnosis Date  . COPD (chronic obstructive pulmonary disease) (HCC)   . Drug abuse (HCC)   . Dupuytren's contracture   . Hypertension    Past Surgical History:  Procedure Laterality Date  . HAND SURGERY       A IV Location/Drains/Wounds Patient Lines/Drains/Airways Status   Active Line/Drains/Airways    Name:   Placement date:   Placement time:   Site:   Days:   Peripheral IV 03/05/19 Right Forearm   03/05/19    1808    Forearm   less than 1   Peripheral IV 03/05/19 Right Forearm   03/05/19    1808    Forearm   less than 1          Intake/Output Last 24 hours No intake or output data in the 24 hours ending 03/05/19 1859  Labs/Imaging No results found for this or any previous visit (from the past 48 hour(s)). Dg Chest Portable 1 View  Result Date: 03/05/2019 CLINICAL DATA:  Shortness of breath EXAM: PORTABLE CHEST 1 VIEW COMPARISON:  Prior chest x-rays could not be retrieved for comparison. FINDINGS: The cardiac silhouette is mildly enlarged. There are diffuse prominent interstitial lung markings bilaterally. There is fullness of the perihilar regions bilaterally. There is some mild blunting of the costophrenic angles bilaterally. No pneumothorax. Emphysematous changes are noted bilaterally. No displaced fracture. IMPRESSION: Diffuse interstitial lung markings  bilaterally may represent developing pulmonary edema or an atypical infectious process. Electronically Signed   By: Katherine Mantlehristopher  Green M.D.   On: 03/05/2019 18:14    Pending Labs Unresulted Labs (From admission, onward)    Start     Ordered   03/05/19 1830  SARS Coronavirus 2 (CEPHEID- Performed in [facility] hospital lab), Hosp Order  (Symptomatic Patients Labs with Precautions )  Once,   STAT     03/05/19 1829   03/05/19 1825  Blood gas, arterial ([facility] & [facility] ONLY)  ONCE - STAT,   STAT     03/05/19 1824   03/05/19 1812  Basic metabolic panel  ONCE - STAT,   STAT     03/05/19 1812   03/05/19 1812  CBC  ONCE - STAT,   STAT     03/05/19 1812   03/05/19 1812  Troponin I - ONCE - STAT  ONCE - STAT,   STAT     03/05/19 1812          Vitals/Pain Today's Vitals   03/05/19 1831 03/05/19 1833 03/05/19 1833 03/05/19 1836  BP:  (!) 151/67 (!) 151/67   Pulse:   (!) 113   Resp:   (!) 24   Temp:      TempSrc:      SpO2: 95%  100%   Weight:      Height:  PainSc:   0-No pain 0-No pain    Isolation Precautions Droplet and Contact precautions  Medications Medications  sodium chloride flush (NS) 0.9 % injection 3 mL (3 mLs Intravenous Not Given 03/05/19 1832)  ipratropium-albuterol (DUONEB) 0.5-2.5 (3) MG/3ML nebulizer solution (3 mLs  Given 03/05/19 1831)  methylPREDNISolone sodium succinate (SOLU-MEDROL) 125 mg/2 mL injection 125 mg (125 mg Intravenous Given 03/05/19 1815)    Mobility walks Low fall risk   Focused Assessments ST 106; Dim lung sounds; denies CP; anxious but calming down since being on Bipap; was diaphoretic on arrival    R Recommendations: See Admitting Provider Note  Report given to:   Additional Notes:  Solu medrol and neb given on arrival; hist emphysema; s/s for a week; afebrile; covid test sent; R fa 20g/18g. Alert.

## 2019-03-05 NOTE — ED Notes (Addendum)
TV turned on for pt. Pt more relaxed; not as anxious as earlier. Not as diaphoretic. Still red in the face/chest.

## 2019-03-05 NOTE — ED Notes (Signed)
Pt placed on bipap after cxr done.  md and RT at bedside.  Sinus tach on monitor.

## 2019-03-05 NOTE — ED Notes (Signed)
Admitting provider at bedside.

## 2019-03-05 NOTE — ED Notes (Signed)
This RN butterfly sticking for cultures at L arm.

## 2019-03-05 NOTE — ED Notes (Signed)
Pt on bipap.  md at bedside.  Pt swabbed for corvid 19.

## 2019-03-06 ENCOUNTER — Inpatient Hospital Stay (HOSPITAL_COMMUNITY)
Admit: 2019-03-06 | Discharge: 2019-03-06 | Disposition: A | Discharge status: Home / Self Care | Payer: Medicaid Other | Attending: Pulmonary Disease | Admitting: Pulmonary Disease

## 2019-03-06 DIAGNOSIS — I340 Nonrheumatic mitral (valve) insufficiency: Secondary | ICD-10-CM

## 2019-03-06 LAB — CBC
HCT: 40.8 % (ref 39.0–52.0)
Hemoglobin: 14.5 g/dL (ref 13.0–17.0)
MCH: 34 pg (ref 26.0–34.0)
MCHC: 35.5 g/dL (ref 30.0–36.0)
MCV: 95.6 fL (ref 80.0–100.0)
Platelets: 313 10*3/uL (ref 150–400)
RBC: 4.27 MIL/uL (ref 4.22–5.81)
RDW: 13.6 % (ref 11.5–15.5)
WBC: 5.9 10*3/uL (ref 4.0–10.5)
nRBC: 0 % (ref 0.0–0.2)

## 2019-03-06 LAB — URINE DRUG SCREEN, QUALITATIVE (ARMC ONLY)
Amphetamines, Ur Screen: NOT DETECTED
Barbiturates, Ur Screen: NOT DETECTED
Benzodiazepine, Ur Scrn: NOT DETECTED
Cannabinoid 50 Ng, Ur ~~LOC~~: NOT DETECTED
Cocaine Metabolite,Ur ~~LOC~~: NOT DETECTED
MDMA (Ecstasy)Ur Screen: NOT DETECTED
Methadone Scn, Ur: NOT DETECTED
Opiate, Ur Screen: NOT DETECTED
Phencyclidine (PCP) Ur S: NOT DETECTED
Tricyclic, Ur Screen: NOT DETECTED

## 2019-03-06 LAB — BRAIN NATRIURETIC PEPTIDE: B Natriuretic Peptide: 503 pg/mL — ABNORMAL HIGH (ref 0.0–100.0)

## 2019-03-06 LAB — LACTIC ACID, PLASMA: Lactic Acid, Venous: 1.2 mmol/L (ref 0.5–1.9)

## 2019-03-06 LAB — ECHOCARDIOGRAM COMPLETE
Height: 68 in
Weight: 2320 oz

## 2019-03-06 LAB — BASIC METABOLIC PANEL
Anion gap: 12 (ref 5–15)
BUN: 13 mg/dL (ref 8–23)
CO2: 22 mmol/L (ref 22–32)
Calcium: 8.7 mg/dL — ABNORMAL LOW (ref 8.9–10.3)
Chloride: 99 mmol/L (ref 98–111)
Creatinine, Ser: 0.79 mg/dL (ref 0.61–1.24)
GFR calc Af Amer: >60 mL/min (ref >60)
GFR calc non Af Amer: >60 mL/min (ref >60)
Glucose, Bld: 158 mg/dL — ABNORMAL HIGH (ref 70–99)
Potassium: 4 mmol/L (ref 3.5–5.1)
Sodium: 133 mmol/L — ABNORMAL LOW (ref 135–145)

## 2019-03-06 LAB — TROPONIN I: Troponin I: 0.12 ng/mL (ref <0.03)

## 2019-03-06 LAB — PROCALCITONIN
Procalcitonin: 0.27 ng/mL
Procalcitonin: 0.4 ng/mL

## 2019-03-06 LAB — MRSA PCR SCREENING: MRSA by PCR: NEGATIVE

## 2019-03-06 LAB — GLUCOSE, CAPILLARY: Glucose-Capillary: 148 mg/dL — ABNORMAL HIGH (ref 70–99)

## 2019-03-06 LAB — STREP PNEUMONIAE URINARY ANTIGEN: Strep Pneumo Urinary Antigen: NEGATIVE

## 2019-03-06 MED ORDER — NICOTINE 21 MG/24HR TD PT24
21.0000 mg | MEDICATED_PATCH | Freq: Every day | TRANSDERMAL | Status: DC
  Administered 2019-03-06 – 2019-03-11 (×7): 21 mg via TRANSDERMAL
  Filled 2019-03-06 (×8): qty 1

## 2019-03-06 MED ORDER — SODIUM CHLORIDE 0.9 % IV SOLN
INTRAVENOUS | Status: DC | PRN
  Administered 2019-03-06: 19:00:00 via INTRAVENOUS
  Administered 2019-03-07: 250 mL via INTRAVENOUS

## 2019-03-06 MED ORDER — ORAL CARE MOUTH RINSE
15.0000 mL | Freq: Two times a day (BID) | OROMUCOSAL | Status: DC
  Administered 2019-03-06 – 2019-03-10 (×7): 15 mL via OROMUCOSAL

## 2019-03-06 MED ORDER — ONDANSETRON HCL 4 MG/2ML IJ SOLN: 4.0000 mg | Freq: Four times a day (QID) | INTRAMUSCULAR | Status: DC | PRN

## 2019-03-06 MED ORDER — MOMETASONE FURO-FORMOTEROL FUM 200-5 MCG/ACT IN AERO
2.0000 | INHALATION_SPRAY | Freq: Two times a day (BID) | RESPIRATORY_TRACT | Status: DC
  Administered 2019-03-06: 10:00:00 2 via RESPIRATORY_TRACT
  Filled 2019-03-06: qty 8.8

## 2019-03-06 MED ORDER — SODIUM CHLORIDE 0.9 % IV SOLN
500.0000 mg | INTRAVENOUS | Status: DC
  Filled 2019-03-06: qty 500

## 2019-03-06 MED ORDER — FUROSEMIDE 10 MG/ML IJ SOLN
20.0000 mg | Freq: Once | INTRAMUSCULAR | Status: AC
  Administered 2019-03-06: 20 mg via INTRAVENOUS
  Filled 2019-03-06: qty 2

## 2019-03-06 MED ORDER — METHYLPREDNISOLONE SODIUM SUCC 125 MG IJ SOLR
60.0000 mg | Freq: Four times a day (QID) | INTRAMUSCULAR | Status: DC
  Administered 2019-03-06 (×2): 60 mg via INTRAVENOUS
  Filled 2019-03-06 (×2): qty 2

## 2019-03-06 MED ORDER — ACETAMINOPHEN 650 MG RE SUPP: 650.0000 mg | Freq: Four times a day (QID) | RECTAL | Status: DC | PRN

## 2019-03-06 MED ORDER — CHLORHEXIDINE GLUCONATE 0.12 % MT SOLN
15.0000 mL | Freq: Two times a day (BID) | OROMUCOSAL | Status: DC
  Administered 2019-03-07 – 2019-03-10 (×7): 15 mL via OROMUCOSAL
  Filled 2019-03-06 (×6): qty 15

## 2019-03-06 MED ORDER — SODIUM CHLORIDE 0.9 % IV SOLN
2.0000 g | INTRAVENOUS | Status: DC
  Filled 2019-03-06: qty 20

## 2019-03-06 MED ORDER — ACETAMINOPHEN 325 MG PO TABS
650.0000 mg | ORAL_TABLET | Freq: Four times a day (QID) | ORAL | Status: DC | PRN
  Administered 2019-03-06: 23:00:00 650 mg via ORAL
  Filled 2019-03-06: qty 2

## 2019-03-06 MED ORDER — PREDNISONE 20 MG PO TABS: 20.0000 mg | ORAL_TABLET | Freq: Every day | ORAL | Status: DC

## 2019-03-06 MED ORDER — SODIUM CHLORIDE 0.9 % IV SOLN
2.0000 g | INTRAVENOUS | Status: DC
  Administered 2019-03-06: 2 g via INTRAVENOUS
  Filled 2019-03-06: qty 2
  Filled 2019-03-06: qty 20

## 2019-03-06 MED ORDER — PREDNISONE 10 MG PO TABS: 10.0000 mg | ORAL_TABLET | Freq: Every day | ORAL | Status: DC

## 2019-03-06 MED ORDER — ONDANSETRON HCL 4 MG PO TABS: 4.0000 mg | ORAL_TABLET | Freq: Four times a day (QID) | ORAL | Status: DC | PRN

## 2019-03-06 MED ORDER — AZITHROMYCIN 500 MG PO TABS
500.0000 mg | ORAL_TABLET | Freq: Every day | ORAL | Status: DC
  Administered 2019-03-06: 500 mg via ORAL
  Filled 2019-03-06 (×2): qty 1

## 2019-03-06 MED ORDER — IPRATROPIUM-ALBUTEROL 0.5-2.5 (3) MG/3ML IN SOLN
3.0000 mL | Freq: Four times a day (QID) | RESPIRATORY_TRACT | Status: DC
  Administered 2019-03-06 – 2019-03-11 (×23): 3 mL via RESPIRATORY_TRACT
  Filled 2019-03-06 (×22): qty 3

## 2019-03-06 MED ORDER — ENOXAPARIN SODIUM 40 MG/0.4ML ~~LOC~~ SOLN
40.0000 mg | SUBCUTANEOUS | Status: DC
  Administered 2019-03-06: 10:00:00 40 mg via SUBCUTANEOUS
  Filled 2019-03-06: qty 0.4

## 2019-03-06 MED ORDER — IPRATROPIUM-ALBUTEROL 0.5-2.5 (3) MG/3ML IN SOLN
3.0000 mL | RESPIRATORY_TRACT | Status: DC | PRN
  Administered 2019-03-07: 3 mL via RESPIRATORY_TRACT

## 2019-03-06 MED ORDER — ENOXAPARIN SODIUM 40 MG/0.4ML ~~LOC~~ SOLN
40.0000 mg | SUBCUTANEOUS | Status: DC
  Administered 2019-03-06 – 2019-03-10 (×5): 40 mg via SUBCUTANEOUS
  Filled 2019-03-06 (×5): qty 0.4

## 2019-03-06 MED ORDER — PREDNISONE 20 MG PO TABS: 30.0000 mg | ORAL_TABLET | Freq: Every day | ORAL | Status: DC

## 2019-03-06 MED ORDER — AMLODIPINE BESYLATE 10 MG PO TABS
10.0000 mg | ORAL_TABLET | Freq: Every day | ORAL | Status: DC
  Administered 2019-03-06 – 2019-03-11 (×6): 10 mg via ORAL
  Filled 2019-03-06 (×6): qty 1

## 2019-03-06 MED ORDER — PREDNISONE 20 MG PO TABS
40.0000 mg | ORAL_TABLET | Freq: Every day | ORAL | Status: DC
  Administered 2019-03-07: 08:00:00 40 mg via ORAL
  Filled 2019-03-06: qty 2

## 2019-03-06 NOTE — Progress Notes (Signed)
Pt looking for wallet pt did not arrive to the unit with wallet, this nurse contacted Raquel in ED to try to locate wallet, wallet not found she will check with nurse and follow up with day shift.

## 2019-03-06 NOTE — Progress Notes (Signed)
[place] at [place] NAME: Brandon Bolton    MR#:  [id number]  DATE OF BIRTH:  Jan 10, 1956  SUBJECTIVE:  CHIEF COMPLAINT: Patient shortness of breath is significantly better.  Off BiPAP  REVIEW OF SYSTEMS:  CONSTITUTIONAL: No fever, fatigue or weakness.  EYES: No blurred or double vision.  EARS, NOSE, AND THROAT: No tinnitus or ear pain.  RESPIRATORY: Improving cough, shortness of breath, wheezing or hemoptysis.  CARDIOVASCULAR: No chest pain, orthopnea, edema.  GASTROINTESTINAL: No nausea, vomiting, diarrhea or abdominal pain.  GENITOURINARY: No dysuria, hematuria.  ENDOCRINE: No polyuria, nocturia,  HEMATOLOGY: No anemia, easy bruising or bleeding SKIN: No rash or lesion. MUSCULOSKELETAL: No joint pain or arthritis.   NEUROLOGIC: No tingling, numbness, weakness.  PSYCHIATRY: No anxiety or depression.   DRUG ALLERGIES:  No Known Allergies  VITALS:  Blood pressure 126/69, pulse (!) 114, temperature 98.2 F (36.8 C), temperature source Axillary, resp. rate 18, height 5' 8" (1.727 m), weight 65.8 kg, SpO2 92 %.  PHYSICAL EXAMINATION:  GENERAL:  63 y.o.-year-old patient lying in the bed with no acute distress.  EYES: Pupils equal, round, reactive to light and accommodation. No scleral icterus. Extraocular muscles intact.  HEENT: Head atraumatic, normocephalic. Oropharynx and nasopharynx clear.  NECK:  Supple, no jugular venous distention. No thyroid enlargement, no tenderness.  LUNGS: Normal breath sounds bilaterally, no wheezing, rales,rhonchi or crepitation. No use of accessory muscles of respiration.  CARDIOVASCULAR: S1, S2 normal. No murmurs, rubs, or gallops.  ABDOMEN: Soft, nontender, nondistended. Bowel sounds present.  EXTREMITIES: No pedal edema, cyanosis, or clubbing.  NEUROLOGIC: Cranial nerves II through XII are intact.  Motor and sensation intact. Gait not checked.  PSYCHIATRIC: The patient is alert and oriented x 3.   SKIN: No obvious rash, lesion, or ulcer.    LABORATORY PANEL:   CBC Recent Labs  Lab 03/06/19 0516  WBC 5.9  HGB 14.5  HCT 40.8  PLT 313   ------------------------------------------------------------------------------------------------------------------  Chemistries  Recent Labs  Lab 03/06/19 0516  NA 133*  K 4.0  CL 99  CO2 22  GLUCOSE 158*  BUN 13  CREATININE 0.79  CALCIUM 8.7*   ------------------------------------------------------------------------------------------------------------------  Cardiac Enzymes Recent Labs  Lab 03/06/19 0516  TROPONINI 0.12*   ------------------------------------------------------------------------------------------------------------------  RADIOLOGY:  Dg Chest Portable 1 View  Result Date: 03/05/2019 CLINICAL DATA:  Shortness of breath EXAM: PORTABLE CHEST 1 VIEW COMPARISON:  Prior chest x-rays could not be retrieved for comparison. FINDINGS: The cardiac silhouette is mildly enlarged. There are diffuse prominent interstitial lung markings bilaterally. There is fullness of the perihilar regions bilaterally. There is some mild blunting of the costophrenic angles bilaterally. No pneumothorax. Emphysematous changes are noted bilaterally. No displaced fracture. IMPRESSION: Diffuse interstitial lung markings bilaterally may represent developing pulmonary edema or an atypical infectious process. Electronically Signed   By: Constance Holster M.D.   On: 03/05/2019 18:14    EKG:   Orders placed or performed during the hospital encounter of 03/05/19  . ED EKG within 10 minutes  . ED EKG within 10 minutes    ASSESSMENT AND PLAN:   #Acute respiratory failure with hypoxia and hypercapnia secondary to COPD exacerbation and pneumonia Of BiPAP Clinically improving Steroids tapered, Bronchodilator treatments  #Sepsis from pneumonia Patient met septic criteria at the time of admission with tachycardia and tachypnea Procalcitonin  0.40 Rocephin and azithromycin   #Elevated troponin probably from demand ischemia Patient is asymptomatic Cycle cardiac biomarkers Echocardiogram  #Essential hypertension-continue amlodipine  and titrate as needed  #Tobacco abuse disorder counseled patient to quit smoking for 5 minutes.  He verbalized understanding.  Agreeable to nicotine patch   All the records are reviewed and case discussed with Care Management/Social Workerr. Management plans discussed with the patient, he is in agreement.  CODE STATUS: fc  TOTAL TIME TAKING CARE OF THIS PATIENT: 36 minutes.   POSSIBLE D/C IN 2 DAYS, DEPENDING ON CLINICAL CONDITION.  Note: This dictation was prepared with Dragon dictation along with smaller phrase technology. Any transcriptional errors that result from this process are unintentional.   Nicholes Mango M.D on 03/06/2019 at 1:21 PM  Between 7am to 6pm - Pager - [phone] After 6pm go to [email or web address] - password [email or web address]  Office  [phone]  CC: Primary care physician; Patient, No Pcp Per

## 2019-03-06 NOTE — Care Plan (Signed)
After further assessment and discussion of patient's  status and medical condition during multidisciplinary rounds, the plan is outlined as below  1.  Change IV steroids to p.o. prednisone taper starting with 40 mg p.o. daily.  2.  Continue nebulization treatments.  Discontinue MDIs as the patient uses nebulizers at home.  3.  Patient may be transferred to the general medical ward.  4.  Continue oxygen supplementation, wean off O2 as tolerated for oxygen saturation of 89% or better.  5.  Encourage tobacco smoking cessation.    Gailen Shelter, M.D.  [facility]

## 2019-03-06 NOTE — Progress Notes (Signed)
*  PRELIMINARY RESULTS* Echocardiogram 2D Echocardiogram has been performed.  [name] 03/06/2019, 11:05 AM

## 2019-03-06 NOTE — Progress Notes (Signed)
PHARMACIST - PHYSICIAN COMMUNICATION  CONCERNING: Antibiotic IV to Oral Route Change Policy  RECOMMENDATION: This patient is receiving azithromycin by the intravenous route.  Based on criteria approved by the Pharmacy and Therapeutics Committee, the antibiotic(s) is/are being converted to the equivalent oral dose form(s).   DESCRIPTION: These criteria include:  Patient being treated for a respiratory tract infection, urinary tract infection, cellulitis or clostridium difficile associated diarrhea if on metronidazole  The patient is not neutropenic and does not exhibit a GI malabsorption state  The patient is eating (either orally or via tube) and/or has been taking other orally administered medications for a least 24 hours  The patient is improving clinically and has a Tmax < 100.5  If you have questions about this conversion, please contact the Pharmacy Department   []   [phone] )  [facility]   Charlies Silvers  05.06.2020 1726

## 2019-03-06 NOTE — Progress Notes (Signed)
eLink Physician-Brief Progress Note Patient Name: Brandon Bolton DOB: 29-Nov-1955 MRN: [id number]   Date of Service  03/06/2019  HPI/Events of Note  Pt admitted for acute exacerbation of COPD and to r/o pneumonia. He is Covid negative. CXR appears a bit wet.  eICU Interventions  Check BNP, Lasix 20 mg iv x 1, I agree with other current orders. New patient evaluation completed.        [name] 03/06/2019, 12:12 AM

## 2019-03-06 NOTE — Progress Notes (Signed)
   03/06/19 1600  Clinical Encounter Type  Visited With Patient  Visit Type Initial  Referral From Physician  Spiritual Encounters  Spiritual Needs Prayer   Chaplain received an OR for prayer for this patient. Upon arrival, the patient was awake and watching television. He was pleasant, welcoming, and expressed thanksgiving for the progress he's made since being admitted. He shared that he has COPD and was fearful that he had COVD-19; he received a negative result from testing and he is grateful. He asked for prayer about his health and to express thanksgiving to God for the good news and his progress. Chaplain prayed for the aforementioned and the patient expressed gratitude. He hopes to be discharged tomorrow.

## 2019-03-06 NOTE — Progress Notes (Signed)
Pt transferred to 101 with telemetry. Report given to [facility], RN. Pt transported in wheelchair with all belongings.

## 2019-03-07 LAB — LEGIONELLA PNEUMOPHILA SEROGP 1 UR AG: L. pneumophila Serogp 1 Ur Ag: NEGATIVE

## 2019-03-07 LAB — HIV ANTIBODY (ROUTINE TESTING W REFLEX): HIV Screen 4th Generation wRfx: NONREACTIVE

## 2019-03-07 LAB — PROCALCITONIN: Procalcitonin: 0.31 ng/mL

## 2019-03-07 MED ORDER — AZITHROMYCIN 500 MG PO TABS
500.0000 mg | ORAL_TABLET | Freq: Every day | ORAL | Status: AC
  Administered 2019-03-07: 500 mg via ORAL
  Filled 2019-03-07: qty 1

## 2019-03-07 MED ORDER — METHYLPREDNISOLONE SODIUM SUCC 40 MG IJ SOLR
40.0000 mg | Freq: Three times a day (TID) | INTRAMUSCULAR | Status: DC
  Administered 2019-03-07 – 2019-03-08 (×4): 40 mg via INTRAVENOUS
  Filled 2019-03-07 (×4): qty 1

## 2019-03-07 MED ORDER — SODIUM CHLORIDE 0.9 % IV SOLN
2.0000 g | INTRAVENOUS | Status: AC
  Administered 2019-03-07: 18:00:00 2 g via INTRAVENOUS
  Filled 2019-03-07: qty 2

## 2019-03-07 MED ORDER — DIPHENHYDRAMINE HCL 25 MG PO CAPS
25.0000 mg | ORAL_CAPSULE | Freq: Four times a day (QID) | ORAL | Status: DC | PRN
  Administered 2019-03-07 – 2019-03-10 (×4): 25 mg via ORAL
  Filled 2019-03-07 (×4): qty 1

## 2019-03-07 MED ORDER — CEFDINIR 300 MG PO CAPS
300.0000 mg | ORAL_CAPSULE | Freq: Two times a day (BID) | ORAL | Status: DC
  Administered 2019-03-08 – 2019-03-11 (×7): 300 mg via ORAL
  Filled 2019-03-07 (×8): qty 1

## 2019-03-07 MED ORDER — DIPHENHYDRAMINE HCL 25 MG PO CAPS
25.0000 mg | ORAL_CAPSULE | Freq: Once | ORAL | Status: AC
  Administered 2019-03-07: 01:00:00 25 mg via ORAL
  Filled 2019-03-07: qty 1

## 2019-03-07 MED ORDER — ALBUTEROL SULFATE (2.5 MG/3ML) 0.083% IN NEBU
2.5000 mg | INHALATION_SOLUTION | RESPIRATORY_TRACT | Status: DC | PRN
  Administered 2019-03-07: 10:00:00 2.5 mg via RESPIRATORY_TRACT
  Filled 2019-03-07: qty 3

## 2019-03-07 NOTE — Progress Notes (Signed)
[facility] at [place] NAME: Brandon Bolton    MR#:  [id number]  DATE OF BIRTH:  12-Oct-1956  SUBJECTIVE:  CHIEF COMPLAINT: Patient feeling tired today  off BiPAP.  Transferred to floor from ICU-03/06/2019  REVIEW OF SYSTEMS:  CONSTITUTIONAL: No fever, fatigue or weakness.  EYES: No blurred or double vision.  EARS, NOSE, AND THROAT: No tinnitus or ear pain.  RESPIRATORY: Improving cough, shortness of breath, wheezing or hemoptysis.  CARDIOVASCULAR: No chest pain, orthopnea, edema.  GASTROINTESTINAL: No nausea, vomiting, diarrhea or abdominal pain.  GENITOURINARY: No dysuria, hematuria.  ENDOCRINE: No polyuria, nocturia,  HEMATOLOGY: No anemia, easy bruising or bleeding SKIN: No rash or lesion. MUSCULOSKELETAL: No joint pain or arthritis.   NEUROLOGIC: No tingling, numbness, weakness.  PSYCHIATRY: No anxiety or depression.   DRUG ALLERGIES:  No Known Allergies  VITALS:  Blood pressure 130/69, pulse (!) 106, temperature 98.4 F (36.9 C), temperature source Oral, resp. rate 14, height 5' 8"  (1.727 m), weight 65.8 kg, SpO2 99 %.  PHYSICAL EXAMINATION:  GENERAL:  63 y.o.-year-old patient lying in the bed with no acute distress.  EYES: Pupils equal, round, reactive to light and accommodation. No scleral icterus. Extraocular muscles intact.  HEENT: Head atraumatic, normocephalic. Oropharynx and nasopharynx clear.  NECK:  Supple, no jugular venous distention. No thyroid enlargement, no tenderness.  LUNGS: Diminished breath sounds bilaterally, no wheezing, rales,rhonchi or crepitation. No use of accessory muscles of respiration.  CARDIOVASCULAR: S1, S2 normal. No murmurs, rubs, or gallops.  ABDOMEN: Soft, nontender, nondistended. Bowel sounds present.  EXTREMITIES: No pedal edema, cyanosis, or clubbing.  NEUROLOGIC: Cranial nerves II through XII are intact.  Motor and sensation intact. Gait not checked.  PSYCHIATRIC: The patient is alert and  oriented x 3.  SKIN: No obvious rash, lesion, or ulcer.    LABORATORY PANEL:   CBC Recent Labs  Lab 03/06/19 0516  WBC 5.9  HGB 14.5  HCT 40.8  PLT 313   ------------------------------------------------------------------------------------------------------------------  Chemistries  Recent Labs  Lab 03/06/19 0516  NA 133*  K 4.0  CL 99  CO2 22  GLUCOSE 158*  BUN 13  CREATININE 0.79  CALCIUM 8.7*   ------------------------------------------------------------------------------------------------------------------  Cardiac Enzymes Recent Labs  Lab 03/06/19 0516  TROPONINI 0.12*   ------------------------------------------------------------------------------------------------------------------  RADIOLOGY:  Dg Chest Portable 1 View  Result Date: 03/05/2019 CLINICAL DATA:  Shortness of breath EXAM: PORTABLE CHEST 1 VIEW COMPARISON:  Prior chest x-rays could not be retrieved for comparison. FINDINGS: The cardiac silhouette is mildly enlarged. There are diffuse prominent interstitial lung markings bilaterally. There is fullness of the perihilar regions bilaterally. There is some mild blunting of the costophrenic angles bilaterally. No pneumothorax. Emphysematous changes are noted bilaterally. No displaced fracture. IMPRESSION: Diffuse interstitial lung markings bilaterally may represent developing pulmonary edema or an atypical infectious process. Electronically Signed   By: Constance Holster M.D.   On: 03/05/2019 18:14    EKG:   Orders placed or performed during the hospital encounter of 03/05/19  . ED EKG within 10 minutes  . ED EKG within 10 minutes    ASSESSMENT AND PLAN:   #Acute respiratory failure with hypoxia and hypercapnia secondary to COPD exacerbation and pneumonia Of BiPAP Clinically deteriorating Solu-Medrol 40 mg IV every 8 hours and bronchodilator treatments  Bronchodilator -albuterol as needed treatments COVID-19 negative  #Sepsis from  pneumonia Patient met septic criteria at the time of admission with tachycardia and tachypnea Procalcitonin 0.40 Rocephin and azithromycin   #  Elevated troponin probably from demand ischemia Patient is asymptomatic Cycle cardiac biomarkers Echocardiogram  #Essential hypertension-continue amlodipine and titrate as needed  #Insomnia Benadryl as needed  #Tobacco abuse disorder counseled patient to quit smoking for 5 minutes.  He verbalized understanding.  Agreeable to nicotine patch   All the records are reviewed and case discussed with Care Management/Social Workerr. Management plans discussed with the patient, he is in agreement.  CODE STATUS: fc  TOTAL TIME TAKING CARE OF THIS PATIENT: 36 minutes.   POSSIBLE D/C IN 2 DAYS, DEPENDING ON CLINICAL CONDITION.  Note: This dictation was prepared with Dragon dictation along with smaller phrase technology. Any transcriptional errors that result from this process are unintentional.   Nicholes Mango M.D on 03/07/2019 at 3:35 PM  Between 7am to 6pm - Pager - [phone] After 6pm go to [email or web address] - password [email or web address] [facility]  Office  [phone]  CC: Primary care physician; Patient, No Pcp Per

## 2019-03-08 MED ORDER — METHYLPREDNISOLONE SODIUM SUCC 125 MG IJ SOLR
60.0000 mg | Freq: Four times a day (QID) | INTRAMUSCULAR | Status: DC
  Administered 2019-03-08 – 2019-03-09 (×5): 60 mg via INTRAVENOUS
  Filled 2019-03-08 (×5): qty 2

## 2019-03-08 NOTE — Progress Notes (Signed)
   03/08/19 1400  Clinical Encounter Type  Visited With Patient  Visit Type Follow-up  Spiritual Encounters  Spiritual Needs Emotional  Ch was rounding. Pt was sitting up and in a pleasant mood. Ch asked him about how he was doing and pt answered that he's gotten much better since he got here. Pt appreciated the prayer that was done a couple days ago by another ch and asked to be kept in prayer.

## 2019-03-08 NOTE — Progress Notes (Signed)
[facility] at [place] NAME: Brandon Bolton    MR#:  [id number]  DATE OF BIRTH:  1956-01-10  SUBJECTIVE:  CHIEF COMPLAINT: Patient is wheezing still tight in his chest  off BiPAP.  Transferred to floor from ICU-03/06/2019  REVIEW OF SYSTEMS:  CONSTITUTIONAL: No fever, fatigue or weakness.  EYES: No blurred or double vision.  EARS, NOSE, AND THROAT: No tinnitus or ear pain.  RESPIRATORY: Improving cough, reports exertional shortness of breath, diffuse wheezing, denies hemoptysis.  CARDIOVASCULAR: No chest pain, orthopnea, edema.  GASTROINTESTINAL: No nausea, vomiting, diarrhea or abdominal pain.  GENITOURINARY: No dysuria, hematuria.  ENDOCRINE: No polyuria, nocturia,  HEMATOLOGY: No anemia, easy bruising or bleeding SKIN: No rash or lesion. MUSCULOSKELETAL: No joint pain or arthritis.   NEUROLOGIC: No tingling, numbness, weakness.  PSYCHIATRY: No anxiety or depression.   DRUG ALLERGIES:  No Known Allergies  VITALS:  Blood pressure 125/80, pulse 97, temperature 98.2 F (36.8 C), resp. rate 18, height 5' 8"  (1.727 m), weight 65.8 kg, SpO2 96 %.  PHYSICAL EXAMINATION:  GENERAL:  63 y.o.-year-old patient lying in the bed with no acute distress.  EYES: Pupils equal, round, reactive to light and accommodation. No scleral icterus. Extraocular muscles intact.  HEENT: Head atraumatic, normocephalic. Oropharynx and nasopharynx clear.  NECK:  Supple, no jugular venous distention. No thyroid enlargement, no tenderness.  LUNGS: Diminished breath sounds bilaterally, minimal diffuse wheezing, no rales,rhonchi or crepitation. No use of accessory muscles of respiration.  CARDIOVASCULAR: S1, S2 normal. No murmurs, rubs, or gallops.  ABDOMEN: Soft, nontender, nondistended. Bowel sounds present.  EXTREMITIES: No pedal edema, cyanosis, or clubbing.  NEUROLOGIC: Cranial nerves II through XII are intact.  Motor and sensation intact. Gait not checked.   PSYCHIATRIC: The patient is alert and oriented x 3.  SKIN: No obvious rash, lesion, or ulcer.    LABORATORY PANEL:   CBC Recent Labs  Lab 03/06/19 0516  WBC 5.9  HGB 14.5  HCT 40.8  PLT 313   ------------------------------------------------------------------------------------------------------------------  Chemistries  Recent Labs  Lab 03/06/19 0516  NA 133*  K 4.0  CL 99  CO2 22  GLUCOSE 158*  BUN 13  CREATININE 0.79  CALCIUM 8.7*   ------------------------------------------------------------------------------------------------------------------  Cardiac Enzymes Recent Labs  Lab 03/06/19 0516  TROPONINI 0.12*   ------------------------------------------------------------------------------------------------------------------  RADIOLOGY:  No results found.  EKG:   Orders placed or performed during the hospital encounter of 03/05/19  . ED EKG within 10 minutes  . ED EKG within 10 minutes    ASSESSMENT AND PLAN:   #Acute respiratory failure with hypoxia and hypercapnia secondary to COPD exacerbation and pneumonia Still wheezing today Of BiPAP Solu-Medrol increased to 60 mg IV every 6 hours and  bronchodilator treatments  Bronchodilator -albuterol as needed treatments COVID-19 negative  #Sepsis from pneumonia Patient met septic criteria at the time of admission with tachycardia and tachypnea Procalcitonin 0.40-0.31 Rocephin and azithromycin   #Elevated troponin probably from demand ischemia Patient is asymptomatic Cycle cardiac biomarkers Echocardiogram  #Essential hypertension-continue amlodipine and titrate as needed  #Insomnia Benadryl as needed  #Tobacco abuse disorder counseled patient to quit smoking for 5 minutes.  He verbalized understanding.  Agreeable to nicotine patch   All the records are reviewed and case discussed with Care Management/Social Workerr. Management plans discussed with the patient, he is in agreement.  CODE  STATUS: fc  TOTAL TIME TAKING CARE OF THIS PATIENT: 36 minutes.   POSSIBLE D/C IN 2 DAYS, DEPENDING ON  CLINICAL CONDITION.  Note: This dictation was prepared with Dragon dictation along with smaller phrase technology. Any transcriptional errors that result from this process are unintentional.   Nicholes Mango M.D on 03/08/2019 at 1:42 PM  Between 7am to 6pm - Pager - [phone] After 6pm go to [email or web address] - password [email or web address] [facility]  Office  [phone]  CC: Primary care physician; Patient, No Pcp Per

## 2019-03-09 MED ORDER — METHYLPREDNISOLONE SODIUM SUCC 40 MG IJ SOLR
40.0000 mg | Freq: Two times a day (BID) | INTRAMUSCULAR | Status: DC
  Administered 2019-03-09 – 2019-03-10 (×2): 40 mg via INTRAVENOUS
  Filled 2019-03-09 (×2): qty 1

## 2019-03-09 NOTE — Progress Notes (Signed)
Ambulated with patient to restroom. Adjusted patient to 2L [place], rechecked oxygen 15 minutes later, 95% oxygen sat. Will continue to monitor. Junie Spencer, RN

## 2019-03-09 NOTE — Progress Notes (Signed)
SATURATION QUALIFICATIONS: (This note is used to comply with regulatory documentation for home oxygen)  Patient Saturations on Room Air at Rest = %  Patient Saturations on Room Air while Ambulating = 89%  Patient Saturations on 2 Liters of oxygen while Ambulating = 94%  Please briefly explain why patient needs home oxygen:

## 2019-03-09 NOTE — Progress Notes (Signed)
[facility] at [place] NAME: Brandon Bolton    MR#:  [id number]  DATE OF BIRTH:  [birth year]  SUBJECTIVE:  CHIEF COMPLAINT: Patient is feeling much better shortness of breath and wheezing improved  off BiPAP.  Transferred to floor from ICU-03/06/2019  REVIEW OF SYSTEMS:  CONSTITUTIONAL: No fever, fatigue or weakness.  EYES: No blurred or double vision.  EARS, NOSE, AND THROAT: No tinnitus or ear pain.  RESPIRATORY: Improving cough, reports exertional shortness of breath, diffuse wheezing, denies hemoptysis.  CARDIOVASCULAR: No chest pain, orthopnea, edema.  GASTROINTESTINAL: No nausea, vomiting, diarrhea or abdominal pain.  GENITOURINARY: No dysuria, hematuria.  ENDOCRINE: No polyuria, nocturia,  HEMATOLOGY: No anemia, easy bruising or bleeding SKIN: No rash or lesion. MUSCULOSKELETAL: No joint pain or arthritis.   NEUROLOGIC: No tingling, numbness, weakness.  PSYCHIATRY: No anxiety or depression.   DRUG ALLERGIES:  No Known Allergies  VITALS:  Blood pressure 133/77, pulse (!) 106, temperature 98.3 F (36.8 C), temperature source Oral, resp. rate 16, height 5' 8" (1.727 m), weight 65.8 kg, SpO2 97 %.  PHYSICAL EXAMINATION:  GENERAL:  63 y.o.-year-old patient lying in the bed with no acute distress.  EYES: Pupils equal, round, reactive to light and accommodation. No scleral icterus. Extraocular muscles intact.  HEENT: Head atraumatic, normocephalic. Oropharynx and nasopharynx clear.  NECK:  Supple, no jugular venous distention. No thyroid enlargement, no tenderness.  LUNGS: Diminished breath sounds bilaterally, minimal diffuse wheezing, no rales,rhonchi or crepitation. No use of accessory muscles of respiration.  CARDIOVASCULAR: S1, S2 normal. No murmurs, rubs, or gallops.  ABDOMEN: Soft, nontender, nondistended. Bowel sounds present.  EXTREMITIES: No pedal edema, cyanosis, or clubbing.  NEUROLOGIC: Cranial nerves II through XII are  intact.  Motor and sensation intact. Gait not checked.  PSYCHIATRIC: The patient is alert and oriented x 3.  SKIN: No obvious rash, lesion, or ulcer.    LABORATORY PANEL:   CBC Recent Labs  Lab 03/06/19 0516  WBC 5.9  HGB 14.5  HCT 40.8  PLT 313   ------------------------------------------------------------------------------------------------------------------  Chemistries  Recent Labs  Lab 03/06/19 0516  NA 133*  K 4.0  CL 99  CO2 22  GLUCOSE 158*  BUN 13  CREATININE 0.79  CALCIUM 8.7*   ------------------------------------------------------------------------------------------------------------------  Cardiac Enzymes Recent Labs  Lab 03/06/19 0516  TROPONINI 0.12*   ------------------------------------------------------------------------------------------------------------------  RADIOLOGY:  No results found.  EKG:   Orders placed or performed during the hospital encounter of 03/05/19  . ED EKG within 10 minutes  . ED EKG within 10 minutes    ASSESSMENT AND PLAN:   #Acute respiratory failure with hypoxia and hypercapnia secondary to COPD exacerbation and pneumonia Still wheezing today Of BiPAP Taper Solu-Medrol Ambulate patient Wean off oxygen as tolerated Check ambulatory pulse ox Incentive spirometry  Bronchodilator -albuterol as needed treatments COVID-19 negative  #Sepsis from pneumonia Patient met septic criteria at the time of admission with tachycardia and tachypnea Procalcitonin 0.40-0.31 Rocephin and azithromycin   #Elevated troponin probably from demand ischemia Patient is asymptomatic Cycle cardiac biomarkers Echocardiogram-35 to 40% ejection fraction.  Cavity size was normal.  Global hypokinesis noticed  #Essential hypertension-continue amlodipine and titrate as needed  #Insomnia Benadryl as needed  #Tobacco abuse disorder counseled patient to quit smoking for 5 minutes.  He verbalized understanding.  Agreeable to nicotine  patch   All the records are reviewed and case discussed with Care Management/Social Workerr. Management plans discussed with the patient, he is in agreement.  CODE STATUS: fc  TOTAL TIME TAKING CARE OF THIS PATIENT: 36 minutes.   POSSIBLE D/C IN 1-2 DAYS, DEPENDING ON CLINICAL CONDITION.  Note: This dictation was prepared with Dragon dictation along with smaller phrase technology. Any transcriptional errors that result from this process are unintentional.   Aruna Gouru M.D on 03/09/2019 at 3:36 PM  Between 7am to 6pm - Pager - [phone] After 6pm go to [email or web address] - password [email or web address]  [facility]  Office  [phone]  CC: Primary care physician; Patient, No Pcp Per 

## 2019-03-09 NOTE — Progress Notes (Signed)
Took over care from Emilia Beck, Charity fundraiser. Patient sleeping, woke up when this nurse came in. Alert and oriented, breathing well, did not complain of any pain. Will continue to monitor, Junie Spencer, RN

## 2019-03-10 LAB — CULTURE, BLOOD (ROUTINE X 2)
Culture: NO GROWTH
Culture: NO GROWTH
Special Requests: ADEQUATE
Special Requests: ADEQUATE

## 2019-03-10 MED ORDER — METHYLPREDNISOLONE SODIUM SUCC 40 MG IJ SOLR
40.0000 mg | INTRAMUSCULAR | Status: DC
  Administered 2019-03-11: 40 mg via INTRAVENOUS
  Filled 2019-03-10: qty 1

## 2019-03-10 MED ORDER — NICOTINE 21 MG/24HR TD PT24: 21.0000 mg | MEDICATED_PATCH | Freq: Every day | TRANSDERMAL | Status: DC

## 2019-03-10 NOTE — Progress Notes (Signed)
Pt did not want to ambulate in hallway to date this shift; wanted to rest after yesterday.

## 2019-03-10 NOTE — Progress Notes (Signed)
[facility] at [place] NAME: Brandon Bolton    MR#:  [id number]  DATE OF BIRTH:  01/01/1956  SUBJECTIVE:  CHIEF COMPLAINT: Patient is feeling much better shortness of breath and wheezing improved still on 2 L could not wean him off  off BiPAP.  Transferred to floor from ICU-03/06/2019  REVIEW OF SYSTEMS:  CONSTITUTIONAL: No fever, fatigue or weakness.  EYES: No blurred or double vision.  EARS, NOSE, AND THROAT: No tinnitus or ear pain.  RESPIRATORY: Improving cough, reports exertional shortness of breath, diffuse wheezing, denies hemoptysis.  CARDIOVASCULAR: No chest pain, orthopnea, edema.  GASTROINTESTINAL: No nausea, vomiting, diarrhea or abdominal pain.  GENITOURINARY: No dysuria, hematuria.  ENDOCRINE: No polyuria, nocturia,  HEMATOLOGY: No anemia, easy bruising or bleeding SKIN: No rash or lesion. MUSCULOSKELETAL: No joint pain or arthritis.   NEUROLOGIC: No tingling, numbness, weakness.  PSYCHIATRY: No anxiety or depression.   DRUG ALLERGIES:  No Known Allergies  VITALS:  Blood pressure 130/76, pulse 88, temperature 98 F (36.7 C), temperature source Oral, resp. rate 19, height 5' 8"  (1.727 m), weight 65.8 kg, SpO2 96 %.  PHYSICAL EXAMINATION:  GENERAL:  63 y.o.-year-old patient lying in the bed with no acute distress.  EYES: Pupils equal, round, reactive to light and accommodation. No scleral icterus. Extraocular muscles intact.  HEENT: Head atraumatic, normocephalic. Oropharynx and nasopharynx clear.  NECK:  Supple, no jugular venous distention. No thyroid enlargement, no tenderness.  LUNGS: Diminished breath sounds bilaterally, minimal diffuse wheezing, no rales,rhonchi or crepitation. No use of accessory muscles of respiration.  CARDIOVASCULAR: S1, S2 normal. No murmurs, rubs, or gallops.  ABDOMEN: Soft, nontender, nondistended. Bowel sounds present.  EXTREMITIES: No pedal edema, cyanosis, or clubbing.  NEUROLOGIC: Cranial  nerves II through XII are intact.  Motor and sensation intact. Gait not checked.  PSYCHIATRIC: The patient is alert and oriented x 3.  SKIN: No obvious rash, lesion, or ulcer.    LABORATORY PANEL:   CBC Recent Labs  Lab 03/06/19 0516  WBC 5.9  HGB 14.5  HCT 40.8  PLT 313   ------------------------------------------------------------------------------------------------------------------  Chemistries  Recent Labs  Lab 03/06/19 0516  NA 133*  K 4.0  CL 99  CO2 22  GLUCOSE 158*  BUN 13  CREATININE 0.79  CALCIUM 8.7*   ------------------------------------------------------------------------------------------------------------------  Cardiac Enzymes Recent Labs  Lab 03/06/19 0516  TROPONINI 0.12*   ------------------------------------------------------------------------------------------------------------------  RADIOLOGY:  No results found.  EKG:   Orders placed or performed during the hospital encounter of 03/05/19  . ED EKG within 10 minutes  . ED EKG within 10 minutes    ASSESSMENT AND PLAN:   #Acute respiratory failure with hypoxia and hypercapnia secondary to COPD exacerbation and pneumonia Clinically feeling better  of BiPAP Taper Solu-Medrol Ambulate patient Wean off oxygen as tolerated.  Currently patient is on 2 L and uncomfortable to go home with 2 L Check ambulatory pulse ox in a.m. Incentive spirometry  Bronchodilator -albuterol as needed treatments COVID-19 negative  #Sepsis from pneumonia Patient met septic criteria at the time of admission with tachycardia and tachypnea Procalcitonin 0.40-0.31 Rocephin and azithromycin   #Elevated troponin probably from demand ischemia Patient is asymptomatic Cycle cardiac biomarkers Echocardiogram-35 to 40% ejection fraction.  Cavity size was normal.  Global hypokinesis noticed  #Essential hypertension-continue amlodipine and titrate as needed  #Insomnia Benadryl as needed  #Tobacco abuse  disorder counseled patient to quit smoking for 5 minutes.  He verbalized understanding.  Agreeable to nicotine  patch   All the records are reviewed and case discussed with Care Management/Social Workerr. Management plans discussed with the patient, he is in agreement.  CODE STATUS: fc  TOTAL TIME TAKING CARE OF THIS PATIENT: 36 minutes.   POSSIBLE D/C IN 1 DAYS, DEPENDING ON CLINICAL CONDITION.  Note: This dictation was prepared with Dragon dictation along with smaller phrase technology. Any transcriptional errors that result from this process are unintentional.   Nicholes Mango M.D on 03/10/2019 at 1:09 PM  Between 7am to 6pm - Pager - [phone] After 6pm go to [email or web address] - password [email or web address] [facility]  Office  [phone]  CC: Primary care physician; Patient, No Pcp Per

## 2019-03-11 MED ORDER — PREDNISONE 10 MG (21) PO TBPK: 10.0000 mg | ORAL_TABLET | Freq: Every day | ORAL | 0 refills | Status: DC

## 2019-03-11 MED ORDER — VENTOLIN HFA 108 (90 BASE) MCG/ACT IN AERS: INHALATION_SPRAY | RESPIRATORY_TRACT | 1 refills | Status: AC

## 2019-03-11 MED ORDER — NICOTINE 21 MG/24HR TD PT24: 21.0000 mg | MEDICATED_PATCH | Freq: Every day | TRANSDERMAL | 0 refills | Status: DC

## 2019-03-11 MED ORDER — CEFDINIR 300 MG PO CAPS: 300.0000 mg | ORAL_CAPSULE | Freq: Two times a day (BID) | ORAL | 0 refills | Status: AC

## 2019-03-11 NOTE — Discharge Instructions (Signed)
Follow-up with primary care physician in 3 days Stop smoking

## 2019-03-11 NOTE — Discharge Summary (Signed)
Brandon Bolton at [place] NAME: Brandon Bolton    MR#:  [id number]  DATE OF BIRTH:  February 26, 1956  DATE OF ADMISSION:  03/05/2019 ADMITTING PHYSICIAN: Lance Coon, MD  DATE OF DISCHARGE:  03/11/19   PRIMARY CARE PHYSICIAN: Patient, No Pcp Per    ADMISSION DIAGNOSIS:  Hypoxia [R09.02] COPD exacerbation (Brandon Bolton) [J44.1] Community acquired pneumonia, unspecified laterality [J18.9]  DISCHARGE DIAGNOSIS:  Principal Problem:   Acute respiratory failure with hypoxia and hypercapnia (HCC) Active Problems:   Essential hypertension   Sepsis ([place])   CAP (community acquired pneumonia)   COPD exacerbation ([place])   SECONDARY DIAGNOSIS:   Past Medical History:  Diagnosis Date  . COPD (chronic obstructive pulmonary disease) ([place])   . Diabetes mellitus, type 2 ([place])   . Drug abuse ([place])   . Dupuytren's contracture   . ETOH abuse 2018  . Hypertension   . Mixed hyperlipidemia 01/2018    HOSPITAL COURSE:  Brandon Bolton  is a 63 y.o. male who presents with chief complaint as above.  Patient presents the ED obtunded and on CPAP.  He is in significant respiratory distress.  Here he was found to have pH of 7.13 with CO2 on ABG of 71.  He is also found to have likely pneumonia on chest x-[facility] imaging.  Patient improved significantly with BiPAP in terms of his mental status.  His CO2 came down to 47 and his pH improved to 7.28.  Hospitalist were called for admission  #Acute respiratory failure with hypoxia and hypercapnia secondary to COPD exacerbation and pneumonia Clinically feeling better. off BiPAP Taper Solu-Medrol to p.o. prednisone Ambulate patient [place] off oxygen to room air as tolerated.   ambulatory pulse 91% on room air Incentive spirometry  Bronchodilator -albuterol as needed treatments COVID-19 negative Discharge patient home  #Sepsis from pneumonia Patient met septic criteria at the time of admission with tachycardia and  tachypnea Procalcitonin 0.40-0.31 Rocephin and azithromycin changed to p.o. Augmentin   #Elevated troponin probably from demand ischemia Patient is asymptomatic Cycle cardiac biomarkers Echocardiogram-35 to 40% ejection fraction.  Cavity size was normal.  Global hypokinesis noticed  #Essential hypertension-continue amlodipine and titrate as needed  #Insomnia Benadryl as needed  #Tobacco abuse disorder counseled patient to quit smoking for 5 minutes.  He verbalized understanding.  Agreeable to nicotine patch  Discharge patient home DISCHARGE CONDITIONS:  stable  CONSULTS OBTAINED:     PROCEDURES  None   DRUG ALLERGIES:  No Known Allergies  DISCHARGE MEDICATIONS:   Allergies as of 03/11/2019   No Known Allergies     Medication List    TAKE these medications   amLODipine 10 MG tablet Commonly known as:  NORVASC Take 1 tablet (10 mg total) by mouth daily.   cefdinir 300 MG capsule Commonly known as:  OMNICEF Take 1 capsule (300 mg total) by mouth every 12 (twelve) hours for 4 days.   cetirizine 10 MG tablet Commonly known as:  ZYRTEC Take 1 tablet (10 mg total) by mouth daily.   diphenhydrAMINE 25 mg capsule Commonly known as:  BENADRYL Take 25 mg by mouth every 6 (six) hours as needed for itching, allergies or sleep.   meloxicam 15 MG tablet Commonly known as:  MOBIC Daily. What changed:    how much to take  how to take this  when to take this  additional instructions   neomycin-polymyxin-hydrocortisone OTIC solution Commonly known as:  CORTISPORIN Place 4 drops into the left ear 4 (  four) times daily.   nicotine 21 mg/24hr patch Commonly known as:  NICODERM CQ - dosed in mg/24 hours Place 1 patch (21 mg total) onto the skin daily. Start taking on:  Mar 12, 2019   predniSONE 10 MG (21) Tbpk tablet Commonly known as:  STERAPRED UNI-PAK 21 TAB Take 1 tablet (10 mg total) by mouth daily. Take 6 tablets by mouth for 1 day followed by  5  tablets by mouth for 1 day followed by  4 tablets by mouth for 1 day followed by  3 tablets by mouth for 1 day followed by  2 tablets by mouth for 1 day followed by  1 tablet by mouth for a day and stop   Symbicort 160-4.5 MCG/ACT inhaler Generic drug:  budesonide-formoterol Inhale 2 puffs into the lungs 2 (two) times daily.   Ventolin HFA 108 (90 Base) MCG/ACT inhaler Generic drug:  albuterol INHALE 2 PUFFS BY MOUTH EVERY 6 HOURS AS NEEDED FOR WHEEZING OR SHORTNESS OF BREATH What changed:    how much to take  how to take this  when to take this  reasons to take this  additional instructions        DISCHARGE INSTRUCTIONS:  Follow-up with primary care physician in 3 days Stop smoking   DIET:  Low-salt diet  DISCHARGE CONDITION:  Fair  ACTIVITY:  Activity as tolerated  OXYGEN:  Home Oxygen: No.   Oxygen Delivery: room air  DISCHARGE LOCATION:  home   If you experience worsening of your admission symptoms, develop shortness of breath, life threatening emergency, suicidal or homicidal thoughts you must seek medical attention immediately by calling 911 or calling your MD immediately  if symptoms less severe.  You Must read complete instructions/literature along with all the possible adverse reactions/side effects for all the Medicines you take and that have been prescribed to you. Take any new Medicines after you have completely understood and accpet all the possible adverse reactions/side effects.   Please note  You were cared for by a hospitalist during your hospital stay. If you have any questions about your discharge medications or the care you received while you were in the hospital after you are discharged, you can call the unit and asked to speak with the hospitalist on call if the hospitalist that took care of you is not available. Once you are discharged, your primary care physician will handle any further medical issues. Please note that NO REFILLS for  any discharge medications will be authorized once you are discharged, as it is imperative that you return to your primary care physician (or establish a relationship with a primary care physician if you do not have one) for your aftercare needs so that they can reassess your need for medications and monitor your lab values.     Today  Chief Complaint  Patient presents with  . Shortness of Breath   Patient is feeling much better denies any shortness of breath cough is improving wants to go home, off oxygen  ROS:  CONSTITUTIONAL: Denies fevers, chills. Denies any fatigue, weakness.  EYES: Denies blurry vision, double vision, eye pain. EARS, NOSE, THROAT: Denies tinnitus, ear pain, hearing loss. RESPIRATORY: Denies cough, wheeze, shortness of breath.  CARDIOVASCULAR: Denies chest pain, palpitations, edema.  GASTROINTESTINAL: Denies nausea, vomiting, diarrhea, abdominal pain. Denies bright red blood per rectum. GENITOURINARY: Denies dysuria, hematuria. ENDOCRINE: Denies nocturia or thyroid problems. HEMATOLOGIC AND LYMPHATIC: Denies easy bruising or bleeding. SKIN: Denies rash or lesion. MUSCULOSKELETAL: Denies pain in  neck, back, shoulder, knees, hips or arthritic symptoms.  NEUROLOGIC: Denies paralysis, paresthesias.  PSYCHIATRIC: Denies anxiety or depressive symptoms.   VITAL SIGNS:  Blood pressure 129/68, pulse 82, temperature 97.8 F (36.6 C), temperature source Oral, resp. rate 18, height 5' 8" (1.727 m), weight 65.8 kg, SpO2 98 %.  I/O:  No intake or output data in the 24 hours ending 03/11/19 0909  PHYSICAL EXAMINATION:  GENERAL:  63 y.o.-year-old patient lying in the bed with no acute distress.  EYES: Pupils equal, round, reactive to light and accommodation. No scleral icterus. Extraocular muscles intact.  HEENT: Head atraumatic, normocephalic. Oropharynx and nasopharynx clear.  NECK:  Supple, no jugular venous distention. No thyroid enlargement, no tenderness.  LUNGS:  Normal breath sounds bilaterally, no wheezing, rales,rhonchi or crepitation. No use of accessory muscles of respiration.  CARDIOVASCULAR: S1, S2 normal. No murmurs, rubs, or gallops.  ABDOMEN: Soft, non-tender, non-distended. Bowel sounds present.   EXTREMITIES: No pedal edema, cyanosis, or clubbing.  NEUROLOGIC: Cranial nerves II through XII are intact. Sensation intact. Gait not checked.  PSYCHIATRIC: The patient is alert and oriented x 3.  SKIN: No obvious rash, lesion, or ulcer.   DATA REVIEW:   CBC Recent Labs  Lab 03/06/19 0516  WBC 5.9  HGB 14.5  HCT 40.8  PLT 313    Chemistries  Recent Labs  Lab 03/06/19 0516  NA 133*  K 4.0  CL 99  CO2 22  GLUCOSE 158*  BUN 13  CREATININE 0.79  CALCIUM 8.7*    Cardiac Enzymes Recent Labs  Lab 03/06/19 0516  TROPONINI 0.12*    Microbiology Results  Results for orders placed or performed during the hospital encounter of 03/05/19  SARS Coronavirus 2 (CEPHEID- Performed in [facility] hospital lab), Hosp Order     Status: None   Collection Time: 03/05/19  6:22 PM  Result Value Ref Range Status   SARS Coronavirus 2 NEGATIVE NEGATIVE Final    Comment: (NOTE) If result is NEGATIVE SARS-CoV-2 target nucleic acids are NOT DETECTED. The SARS-CoV-2 RNA is generally detectable in upper and lower  respiratory specimens during the acute phase of infection. The lowest  concentration of SARS-CoV-2 viral copies this assay can detect is 250  copies / mL. A negative result does not preclude SARS-CoV-2 infection  and should not be used as the sole basis for treatment or other  patient management decisions.  A negative result may occur with  improper specimen collection / handling, submission of specimen other  than nasopharyngeal swab, presence of viral mutation(s) within the  areas targeted by this assay, and inadequate number of viral copies  (<250 copies / mL). A negative result must be combined with clinical  observations, patient  history, and epidemiological information. If result is POSITIVE SARS-CoV-2 target nucleic acids are DETECTED. The SARS-CoV-2 RNA is generally detectable in upper and lower  respiratory specimens dur ing the acute phase of infection.  Positive  results are indicative of active infection with SARS-CoV-2.  Clinical  correlation with patient history and other diagnostic information is  necessary to determine patient infection status.  Positive results do  not rule out bacterial infection or co-infection with other viruses. If result is PRESUMPTIVE POSTIVE SARS-CoV-2 nucleic acids MAY BE PRESENT.   A presumptive positive result was obtained on the submitted specimen  and confirmed on repeat testing.  While 2019 novel coronavirus  (SARS-CoV-2) nucleic acids may be present in the submitted sample  additional confirmatory testing may be necessary for  epidemiological  and / or clinical management purposes  to differentiate between  SARS-CoV-2 and other Sarbecovirus currently known to infect humans.  If clinically indicated additional testing with an alternate test  methodology [phone]) is advised. The SARS-CoV-2 RNA is generally  detectable in upper and lower respiratory sp ecimens during the acute  phase of infection. The expected result is Negative. Fact Sheet for Patients:  StrictlyIdeas.no Fact Sheet for Healthcare Providers: BankingDealers.co.za This test is not yet approved or cleared by the [place] FDA and has been authorized for detection and/or diagnosis of SARS-CoV-2 by FDA under an Emergency Use Authorization (EUA).  This EUA will remain in effect (meaning this test can be used) for the duration of the COVID-19 declaration under Section 564(b)(1) of the Act, 21 U.S.C. section 360bbb-3(b)(1), unless the authorization is terminated or revoked sooner. Performed at [facility], [address] [place], [place]  [address]   Blood culture (routine x 2)     Status: None   Collection Time: 03/05/19 10:12 PM  Result Value Ref Range Status   Specimen Description BLOOD BLOOD RIGHT HAND  Final   Special Requests   Final    BOTTLES DRAWN AEROBIC AND ANAEROBIC Blood Culture adequate volume   Culture   Final    NO GROWTH 5 DAYS Performed at [facility], [address] [place], [place] [address]    Report Status 03/10/2019 FINAL  Final  Blood culture (routine x 2)     Status: None   Collection Time: 03/05/19 10:12 PM  Result Value Ref Range Status   Specimen Description BLOOD LEFT ANTECUBITAL  Final   Special Requests   Final    BOTTLES DRAWN AEROBIC AND ANAEROBIC Blood Culture adequate volume   Culture   Final    NO GROWTH 5 DAYS Performed at [facility], [address], [place], [place] [address]    Report Status 03/10/2019 FINAL  Final  MRSA PCR Screening     Status: None   Collection Time: 03/05/19 11:59 PM  Result Value Ref Range Status   MRSA by PCR NEGATIVE NEGATIVE Final    Comment:        The GeneXpert MRSA Assay (FDA approved for NASAL specimens only), is one component of a comprehensive MRSA colonization surveillance program. It is not intended to diagnose MRSA infection nor to guide or monitor treatment for MRSA infections. Performed at [facility], [address] [place], [place] [address]     RADIOLOGY:  No results found.  EKG:   Orders placed or performed during the hospital encounter of 03/05/19  . ED EKG within 10 minutes  . ED EKG within 10 minutes      Management plans discussed with the patient, he is  in agreement.  CODE STATUS:     Code Status Orders  (From admission, onward)         Start     Ordered   03/06/19 0007  Full code  Continuous     03/06/19 0006        Code Status History    This patient has a current code status but no historical code status.      TOTAL TIME TAKING CARE OF THIS PATIENT: 43   minutes.   Note: This dictation was prepared with Dragon dictation along with smaller phrase technology. Any transcriptional errors that result from this process are unintentional.   _0 @  on 03/11/2019 at 9:09 AM  Between 7am to 6pm -  Pager - [phone]  After 6pm go to [email or web address] - password [email or web address] [place] [facility]  Office  [phone]  CC: Primary care physician; Patient, No Pcp Per

## 2019-03-11 NOTE — Progress Notes (Signed)
Pt ambulated around the nurse's station with out difficulties. His Oxygen saturation dropped to 91 % on R/A. Continue to monitor

## 2019-03-11 NOTE — Progress Notes (Signed)
Discharge instructions given and went over with patient at bedside. All questions answered. Patient to discharge at 1400, when transportation is available. Bo Mcclintock, RN

## 2019-03-31 ENCOUNTER — Other Ambulatory Visit: Payer: Self-pay | Admitting: Adult Health Nurse Practitioner

## 2019-03-31 DIAGNOSIS — J438 Other emphysema: Secondary | ICD-10-CM

## 2019-05-14 ENCOUNTER — Other Ambulatory Visit: Payer: Self-pay

## 2019-05-14 ENCOUNTER — Emergency Department: Payer: Medicaid Other

## 2019-05-14 ENCOUNTER — Inpatient Hospital Stay
Admit: 2019-05-14 | Discharge: 2019-05-14 | Disposition: A | Discharge status: Home / Self Care | Payer: Medicaid Other | Attending: Internal Medicine | Admitting: Internal Medicine

## 2019-05-14 ENCOUNTER — Inpatient Hospital Stay
Admission: EM | Admit: 2019-05-14 | Discharge: 2019-05-17 | DRG: 291 | Disposition: A | Discharge status: Home / Self Care | Payer: Medicaid Other | Attending: Internal Medicine | Admitting: Internal Medicine

## 2019-05-14 DIAGNOSIS — I447 Left bundle-branch block, unspecified: Secondary | ICD-10-CM | POA: Diagnosis present

## 2019-05-14 DIAGNOSIS — I110 Hypertensive heart disease with heart failure: Secondary | ICD-10-CM | POA: Diagnosis present

## 2019-05-14 DIAGNOSIS — F419 Anxiety disorder, unspecified: Secondary | ICD-10-CM | POA: Diagnosis present

## 2019-05-14 DIAGNOSIS — Z791 Long term (current) use of non-steroidal anti-inflammatories (NSAID): Secondary | ICD-10-CM | POA: Diagnosis not present

## 2019-05-14 DIAGNOSIS — I5023 Acute on chronic systolic (congestive) heart failure: Secondary | ICD-10-CM | POA: Diagnosis present

## 2019-05-14 DIAGNOSIS — I248 Other forms of acute ischemic heart disease: Secondary | ICD-10-CM | POA: Diagnosis present

## 2019-05-14 DIAGNOSIS — J9621 Acute and chronic respiratory failure with hypoxia: Secondary | ICD-10-CM | POA: Diagnosis present

## 2019-05-14 DIAGNOSIS — Z20828 Contact with and (suspected) exposure to other viral communicable diseases: Secondary | ICD-10-CM | POA: Diagnosis present

## 2019-05-14 DIAGNOSIS — F1010 Alcohol abuse, uncomplicated: Secondary | ICD-10-CM | POA: Diagnosis present

## 2019-05-14 DIAGNOSIS — Z79899 Other long term (current) drug therapy: Secondary | ICD-10-CM

## 2019-05-14 DIAGNOSIS — Z7951 Long term (current) use of inhaled steroids: Secondary | ICD-10-CM | POA: Diagnosis not present

## 2019-05-14 DIAGNOSIS — J441 Chronic obstructive pulmonary disease with (acute) exacerbation: Secondary | ICD-10-CM | POA: Diagnosis present

## 2019-05-14 DIAGNOSIS — Z8249 Family history of ischemic heart disease and other diseases of the circulatory system: Secondary | ICD-10-CM | POA: Diagnosis not present

## 2019-05-14 DIAGNOSIS — F17210 Nicotine dependence, cigarettes, uncomplicated: Secondary | ICD-10-CM | POA: Diagnosis present

## 2019-05-14 DIAGNOSIS — J9601 Acute respiratory failure with hypoxia: Secondary | ICD-10-CM

## 2019-05-14 DIAGNOSIS — E871 Hypo-osmolality and hyponatremia: Secondary | ICD-10-CM | POA: Diagnosis present

## 2019-05-14 DIAGNOSIS — Z8701 Personal history of pneumonia (recurrent): Secondary | ICD-10-CM | POA: Diagnosis not present

## 2019-05-14 DIAGNOSIS — I5021 Acute systolic (congestive) heart failure: Secondary | ICD-10-CM | POA: Diagnosis not present

## 2019-05-14 DIAGNOSIS — I340 Nonrheumatic mitral (valve) insufficiency: Secondary | ICD-10-CM | POA: Diagnosis present

## 2019-05-14 DIAGNOSIS — J9602 Acute respiratory failure with hypercapnia: Secondary | ICD-10-CM | POA: Diagnosis present

## 2019-05-14 DIAGNOSIS — E119 Type 2 diabetes mellitus without complications: Secondary | ICD-10-CM | POA: Diagnosis present

## 2019-05-14 DIAGNOSIS — R0602 Shortness of breath: Secondary | ICD-10-CM

## 2019-05-14 DIAGNOSIS — I426 Alcoholic cardiomyopathy: Secondary | ICD-10-CM | POA: Diagnosis present

## 2019-05-14 DIAGNOSIS — E782 Mixed hyperlipidemia: Secondary | ICD-10-CM | POA: Diagnosis present

## 2019-05-14 HISTORY — DX: Heart failure, unspecified: I50.9

## 2019-05-14 LAB — ECHOCARDIOGRAM COMPLETE
Height: 68 in
Weight: 2320 oz

## 2019-05-14 LAB — TROPONIN I (HIGH SENSITIVITY)
Troponin I (High Sensitivity): 338 ng/L (ref <18)
Troponin I (High Sensitivity): 317 ng/L (ref <18)
Troponin I (High Sensitivity): 309 ng/L (ref <18)

## 2019-05-14 LAB — BLOOD GAS, ARTERIAL
Acid-base deficit: 9 mmol/L — ABNORMAL HIGH (ref 0.0–2.0)
Bicarbonate: 18 mmol/L — ABNORMAL LOW (ref 20.0–28.0)
Delivery systems: POSITIVE
Expiratory PAP: 8
FIO2: 0.36
Inspiratory PAP: 16
O2 Saturation: 98 %
Patient temperature: 37
RATE: 10 resp/min
pCO2 arterial: 42 mmHg (ref 32.0–48.0)
pH, Arterial: 7.24 — ABNORMAL LOW (ref 7.350–7.450)
pO2, Arterial: 120 mmHg — ABNORMAL HIGH (ref 83.0–108.0)

## 2019-05-14 LAB — COMPREHENSIVE METABOLIC PANEL
ALT: 27 U/L (ref 0–44)
AST: 35 U/L (ref 15–41)
Albumin: 4.5 g/dL (ref 3.5–5.0)
Alkaline Phosphatase: 65 U/L (ref 38–126)
Anion gap: 15 (ref 5–15)
BUN: 18 mg/dL (ref 8–23)
CO2: 20 mmol/L — ABNORMAL LOW (ref 22–32)
Calcium: 9.1 mg/dL (ref 8.9–10.3)
Chloride: 86 mmol/L — ABNORMAL LOW (ref 98–111)
Creatinine, Ser: 0.74 mg/dL (ref 0.61–1.24)
GFR calc Af Amer: >60 mL/min (ref >60)
GFR calc non Af Amer: >60 mL/min (ref >60)
Glucose, Bld: 124 mg/dL — ABNORMAL HIGH (ref 70–99)
Potassium: 4.5 mmol/L (ref 3.5–5.1)
Sodium: 121 mmol/L — ABNORMAL LOW (ref 135–145)
Total Bilirubin: 1.1 mg/dL (ref 0.3–1.2)
Total Protein: 7.5 g/dL (ref 6.5–8.1)

## 2019-05-14 LAB — BASIC METABOLIC PANEL
Anion gap: 11 (ref 5–15)
BUN: 19 mg/dL (ref 8–23)
CO2: 22 mmol/L (ref 22–32)
Calcium: 8.5 mg/dL — ABNORMAL LOW (ref 8.9–10.3)
Chloride: 90 mmol/L — ABNORMAL LOW (ref 98–111)
Creatinine, Ser: 0.82 mg/dL (ref 0.61–1.24)
GFR calc Af Amer: >60 mL/min (ref >60)
GFR calc non Af Amer: >60 mL/min (ref >60)
Glucose, Bld: 146 mg/dL — ABNORMAL HIGH (ref 70–99)
Potassium: 5 mmol/L (ref 3.5–5.1)
Sodium: 123 mmol/L — ABNORMAL LOW (ref 135–145)

## 2019-05-14 LAB — PROCALCITONIN: Procalcitonin: 0.19 ng/mL

## 2019-05-14 LAB — PROTIME-INR
INR: 1 (ref 0.8–1.2)
Prothrombin Time: 13.5 seconds (ref 11.4–15.2)

## 2019-05-14 LAB — URINE DRUG SCREEN, QUALITATIVE (ARMC ONLY)
Amphetamines, Ur Screen: NOT DETECTED
Barbiturates, Ur Screen: NOT DETECTED
Benzodiazepine, Ur Scrn: NOT DETECTED
Cannabinoid 50 Ng, Ur ~~LOC~~: NOT DETECTED
Cocaine Metabolite,Ur ~~LOC~~: NOT DETECTED
MDMA (Ecstasy)Ur Screen: NOT DETECTED
Methadone Scn, Ur: NOT DETECTED
Opiate, Ur Screen: NOT DETECTED
Phencyclidine (PCP) Ur S: NOT DETECTED
Tricyclic, Ur Screen: NOT DETECTED

## 2019-05-14 LAB — URINALYSIS, COMPLETE (UACMP) WITH MICROSCOPIC
Bacteria, UA: NONE SEEN
Bilirubin Urine: NEGATIVE
Glucose, UA: 150 mg/dL — AB
Ketones, ur: 20 mg/dL — AB
Leukocytes,Ua: NEGATIVE
Nitrite: NEGATIVE
Protein, ur: 30 mg/dL — AB
Specific Gravity, Urine: 1.021 (ref 1.005–1.030)
pH: 5 (ref 5.0–8.0)

## 2019-05-14 LAB — CBC WITH DIFFERENTIAL/PLATELET
Abs Immature Granulocytes: 0.03 10*3/uL (ref 0.00–0.07)
Basophils Absolute: 0 10*3/uL (ref 0.0–0.1)
Basophils Relative: 0 %
Eosinophils Absolute: 0 10*3/uL (ref 0.0–0.5)
Eosinophils Relative: 0 %
HCT: 44.5 % (ref 39.0–52.0)
Hemoglobin: 15.9 g/dL (ref 13.0–17.0)
Immature Granulocytes: 0 %
Lymphocytes Relative: 9 %
Lymphs Abs: 0.9 10*3/uL (ref 0.7–4.0)
MCH: 33.6 pg (ref 26.0–34.0)
MCHC: 35.7 g/dL (ref 30.0–36.0)
MCV: 94.1 fL (ref 80.0–100.0)
Monocytes Absolute: 0.7 10*3/uL (ref 0.1–1.0)
Monocytes Relative: 8 %
Neutro Abs: 7.8 10*3/uL — ABNORMAL HIGH (ref 1.7–7.7)
Neutrophils Relative %: 83 %
Platelets: 272 10*3/uL (ref 150–400)
RBC: 4.73 MIL/uL (ref 4.22–5.81)
RDW: 13.2 % (ref 11.5–15.5)
WBC: 9.5 10*3/uL (ref 4.0–10.5)
nRBC: 0 % (ref 0.0–0.2)

## 2019-05-14 LAB — GLUCOSE, CAPILLARY
Glucose-Capillary: 167 mg/dL — ABNORMAL HIGH (ref 70–99)
Glucose-Capillary: 209 mg/dL — ABNORMAL HIGH (ref 70–99)
Glucose-Capillary: 192 mg/dL — ABNORMAL HIGH (ref 70–99)

## 2019-05-14 LAB — LACTIC ACID, PLASMA
Lactic Acid, Venous: 2.3 mmol/L (ref 0.5–1.9)
Lactic Acid, Venous: 2.3 mmol/L (ref 0.5–1.9)

## 2019-05-14 LAB — MRSA PCR SCREENING: MRSA by PCR: NEGATIVE

## 2019-05-14 LAB — SARS CORONAVIRUS 2 BY RT PCR (HOSPITAL ORDER, PERFORMED IN ~~LOC~~ HOSPITAL LAB): SARS Coronavirus 2: NEGATIVE

## 2019-05-14 LAB — HEPARIN LEVEL (UNFRACTIONATED)
Heparin Unfractionated: 0.3 IU/mL (ref 0.30–0.70)
Heparin Unfractionated: 0.23 IU/mL — ABNORMAL LOW (ref 0.30–0.70)

## 2019-05-14 LAB — BRAIN NATRIURETIC PEPTIDE: B Natriuretic Peptide: 765 pg/mL — ABNORMAL HIGH (ref 0.0–100.0)

## 2019-05-14 LAB — APTT: aPTT: >160 seconds (ref 24–36)

## 2019-05-14 LAB — TSH: TSH: 2.533 u[IU]/mL (ref 0.350–4.500)

## 2019-05-14 LAB — PHOSPHORUS: Phosphorus: 4.5 mg/dL (ref 2.5–4.6)

## 2019-05-14 LAB — MAGNESIUM: Magnesium: 2 mg/dL (ref 1.7–2.4)

## 2019-05-14 MED ORDER — METHYLPREDNISOLONE SODIUM SUCC 40 MG IJ SOLR
40.0000 mg | Freq: Two times a day (BID) | INTRAMUSCULAR | Status: DC
  Administered 2019-05-14 – 2019-05-17 (×7): 40 mg via INTRAVENOUS
  Filled 2019-05-14 (×7): qty 1

## 2019-05-14 MED ORDER — LORAZEPAM 1 MG PO TABS
1.0000 mg | ORAL_TABLET | Freq: Four times a day (QID) | ORAL | Status: DC | PRN
  Administered 2019-05-14: 1 mg via ORAL
  Filled 2019-05-14: qty 1

## 2019-05-14 MED ORDER — FAMOTIDINE 20 MG PO TABS
20.0000 mg | ORAL_TABLET | Freq: Two times a day (BID) | ORAL | Status: DC
  Administered 2019-05-14 – 2019-05-17 (×6): 20 mg via ORAL
  Filled 2019-05-14 (×6): qty 1

## 2019-05-14 MED ORDER — ENOXAPARIN SODIUM 40 MG/0.4ML ~~LOC~~ SOLN: 40.0000 mg | SUBCUTANEOUS | Status: DC

## 2019-05-14 MED ORDER — ALBUTEROL SULFATE (2.5 MG/3ML) 0.083% IN NEBU
2.5000 mg | INHALATION_SOLUTION | RESPIRATORY_TRACT | Status: DC
  Administered 2019-05-14 – 2019-05-17 (×18): 2.5 mg via RESPIRATORY_TRACT
  Filled 2019-05-14 (×19): qty 3

## 2019-05-14 MED ORDER — DOCUSATE SODIUM 100 MG PO CAPS
100.0000 mg | ORAL_CAPSULE | Freq: Two times a day (BID) | ORAL | Status: DC
  Administered 2019-05-14 – 2019-05-15 (×3): 100 mg via ORAL
  Filled 2019-05-14 (×6): qty 1

## 2019-05-14 MED ORDER — FOLIC ACID 1 MG PO TABS
1.0000 mg | ORAL_TABLET | Freq: Every day | ORAL | Status: DC
  Administered 2019-05-14 – 2019-05-17 (×4): 1 mg via ORAL
  Filled 2019-05-14 (×4): qty 1

## 2019-05-14 MED ORDER — HEPARIN BOLUS VIA INFUSION
900.0000 [IU] | Freq: Once | INTRAVENOUS | Status: AC
  Administered 2019-05-14: 900 [IU] via INTRAVENOUS
  Filled 2019-05-14: qty 900

## 2019-05-14 MED ORDER — ORAL CARE MOUTH RINSE
15.0000 mL | Freq: Two times a day (BID) | OROMUCOSAL | Status: DC
  Administered 2019-05-14 – 2019-05-16 (×2): 15 mL via OROMUCOSAL

## 2019-05-14 MED ORDER — METOPROLOL TARTRATE 5 MG/5ML IV SOLN
5.0000 mg | INTRAVENOUS | Status: AC
  Administered 2019-05-14: 5 mg via INTRAVENOUS
  Filled 2019-05-14: qty 5

## 2019-05-14 MED ORDER — VANCOMYCIN HCL 1.5 G IV SOLR
1500.0000 mg | Freq: Once | INTRAVENOUS | Status: AC
  Administered 2019-05-14: 1500 mg via INTRAVENOUS
  Filled 2019-05-14: qty 1500

## 2019-05-14 MED ORDER — SODIUM CHLORIDE 0.9 % IV BOLUS
500.0000 mL | Freq: Once | INTRAVENOUS | Status: AC
  Administered 2019-05-14: 500 mL via INTRAVENOUS

## 2019-05-14 MED ORDER — FUROSEMIDE 10 MG/ML IJ SOLN
INTRAMUSCULAR | Status: AC
  Administered 2019-05-14: 20 mg via INTRAVENOUS
  Filled 2019-05-14: qty 2

## 2019-05-14 MED ORDER — VANCOMYCIN HCL IN DEXTROSE 1-5 GM/200ML-% IV SOLN
1000.0000 mg | Freq: Two times a day (BID) | INTRAVENOUS | Status: DC
  Filled 2019-05-14: qty 200

## 2019-05-14 MED ORDER — FUROSEMIDE 10 MG/ML IJ SOLN
20.0000 mg | Freq: Once | INTRAMUSCULAR | Status: AC
  Administered 2019-05-14: 05:00:00 20 mg via INTRAVENOUS

## 2019-05-14 MED ORDER — VITAMIN B-1 100 MG PO TABS
100.0000 mg | ORAL_TABLET | Freq: Every day | ORAL | Status: DC
  Administered 2019-05-14 – 2019-05-17 (×4): 100 mg via ORAL
  Filled 2019-05-14 (×4): qty 1

## 2019-05-14 MED ORDER — NICOTINE 21 MG/24HR TD PT24
21.0000 mg | MEDICATED_PATCH | Freq: Every day | TRANSDERMAL | Status: DC
  Administered 2019-05-14 – 2019-05-17 (×4): 21 mg via TRANSDERMAL
  Filled 2019-05-14 (×4): qty 1

## 2019-05-14 MED ORDER — HEPARIN (PORCINE) 25000 UT/250ML-% IV SOLN
1250.0000 [IU]/h | INTRAVENOUS | Status: DC
  Administered 2019-05-14: 1250 [IU]/h via INTRAVENOUS
  Administered 2019-05-14: 1050 [IU]/h via INTRAVENOUS
  Filled 2019-05-14 (×3): qty 250

## 2019-05-14 MED ORDER — IPRATROPIUM-ALBUTEROL 0.5-2.5 (3) MG/3ML IN SOLN
3.0000 mL | Freq: Once | RESPIRATORY_TRACT | Status: AC
  Administered 2019-05-14: 3 mL via RESPIRATORY_TRACT
  Filled 2019-05-14: qty 3

## 2019-05-14 MED ORDER — CHLORHEXIDINE GLUCONATE CLOTH 2 % EX PADS
6.0000 | MEDICATED_PAD | Freq: Every day | CUTANEOUS | Status: DC
  Administered 2019-05-14 – 2019-05-17 (×2): 6 via TOPICAL

## 2019-05-14 MED ORDER — ACETAMINOPHEN 325 MG PO TABS
650.0000 mg | ORAL_TABLET | Freq: Four times a day (QID) | ORAL | Status: DC | PRN
  Administered 2019-05-15: 650 mg via ORAL
  Filled 2019-05-14: qty 2

## 2019-05-14 MED ORDER — CHLORHEXIDINE GLUCONATE 0.12 % MT SOLN
15.0000 mL | Freq: Two times a day (BID) | OROMUCOSAL | Status: DC
  Administered 2019-05-14 – 2019-05-17 (×6): 15 mL via OROMUCOSAL
  Filled 2019-05-14 (×6): qty 15

## 2019-05-14 MED ORDER — LORAZEPAM 0.5 MG PO TABS: 0.5000 mg | ORAL_TABLET | Freq: Once | ORAL | Status: DC

## 2019-05-14 MED ORDER — IPRATROPIUM-ALBUTEROL 0.5-2.5 (3) MG/3ML IN SOLN
3.0000 mL | Freq: Four times a day (QID) | RESPIRATORY_TRACT | Status: DC | PRN
  Administered 2019-05-14 – 2019-05-17 (×3): 3 mL via RESPIRATORY_TRACT
  Filled 2019-05-14 (×3): qty 3

## 2019-05-14 MED ORDER — LORAZEPAM 2 MG/ML IJ SOLN
0.5000 mg | Freq: Once | INTRAMUSCULAR | Status: AC
  Administered 2019-05-14: 0.5 mg via INTRAVENOUS
  Filled 2019-05-14: qty 1

## 2019-05-14 MED ORDER — MOMETASONE FURO-FORMOTEROL FUM 200-5 MCG/ACT IN AERO
2.0000 | INHALATION_SPRAY | Freq: Two times a day (BID) | RESPIRATORY_TRACT | Status: DC
  Filled 2019-05-14: qty 8.8

## 2019-05-14 MED ORDER — LORAZEPAM 2 MG/ML IJ SOLN: 1.0000 mg | Freq: Four times a day (QID) | INTRAMUSCULAR | Status: DC | PRN

## 2019-05-14 MED ORDER — ONDANSETRON HCL 4 MG/2ML IJ SOLN: 4.0000 mg | Freq: Four times a day (QID) | INTRAMUSCULAR | Status: DC | PRN

## 2019-05-14 MED ORDER — LORAZEPAM 2 MG/ML IJ SOLN: 0.0000 mg | Freq: Two times a day (BID) | INTRAMUSCULAR | Status: DC

## 2019-05-14 MED ORDER — FUROSEMIDE 10 MG/ML IJ SOLN
40.0000 mg | Freq: Once | INTRAMUSCULAR | Status: AC
  Administered 2019-05-14: 40 mg via INTRAVENOUS
  Filled 2019-05-14: qty 4

## 2019-05-14 MED ORDER — ACETAMINOPHEN 650 MG RE SUPP: 650.0000 mg | Freq: Four times a day (QID) | RECTAL | Status: DC | PRN

## 2019-05-14 MED ORDER — INSULIN ASPART 100 UNIT/ML ~~LOC~~ SOLN
0.0000 [IU] | Freq: Three times a day (TID) | SUBCUTANEOUS | Status: DC
  Administered 2019-05-14: 18:00:00 3 [IU] via SUBCUTANEOUS
  Administered 2019-05-15: 2 [IU] via SUBCUTANEOUS
  Administered 2019-05-15: 1 [IU] via SUBCUTANEOUS
  Administered 2019-05-15: 08:00:00 2 [IU] via SUBCUTANEOUS
  Administered 2019-05-16: 3 [IU] via SUBCUTANEOUS
  Administered 2019-05-16: 2 [IU] via SUBCUTANEOUS
  Administered 2019-05-17: 1 [IU] via SUBCUTANEOUS
  Filled 2019-05-14 (×7): qty 1

## 2019-05-14 MED ORDER — ONDANSETRON HCL 4 MG PO TABS: 4.0000 mg | ORAL_TABLET | Freq: Four times a day (QID) | ORAL | Status: DC | PRN

## 2019-05-14 MED ORDER — ADULT MULTIVITAMIN W/MINERALS CH
1.0000 | ORAL_TABLET | Freq: Every day | ORAL | Status: DC
  Administered 2019-05-14 – 2019-05-17 (×4): 1 via ORAL
  Filled 2019-05-14 (×4): qty 1

## 2019-05-14 MED ORDER — IPRATROPIUM-ALBUTEROL 0.5-2.5 (3) MG/3ML IN SOLN
3.0000 mL | Freq: Once | RESPIRATORY_TRACT | Status: AC
  Administered 2019-05-14: 04:00:00 3 mL via RESPIRATORY_TRACT

## 2019-05-14 MED ORDER — THIAMINE HCL 100 MG/ML IJ SOLN
100.0000 mg | Freq: Every day | INTRAMUSCULAR | Status: DC
  Filled 2019-05-14: qty 2

## 2019-05-14 MED ORDER — SODIUM CHLORIDE 0.9 % IV SOLN
2.0000 g | Freq: Once | INTRAVENOUS | Status: AC
  Administered 2019-05-14: 2 g via INTRAVENOUS
  Filled 2019-05-14: qty 2

## 2019-05-14 MED ORDER — SODIUM CHLORIDE 0.9 % IV SOLN
2.0000 g | Freq: Three times a day (TID) | INTRAVENOUS | Status: DC
  Administered 2019-05-14: 2 g via INTRAVENOUS
  Filled 2019-05-14 (×3): qty 2

## 2019-05-14 MED ORDER — INSULIN ASPART 100 UNIT/ML ~~LOC~~ SOLN: 0.0000 [IU] | Freq: Every day | SUBCUTANEOUS | Status: DC

## 2019-05-14 MED ORDER — LORAZEPAM 2 MG/ML IJ SOLN: 0.0000 mg | Freq: Four times a day (QID) | INTRAMUSCULAR | Status: DC

## 2019-05-14 MED ORDER — BUDESONIDE 0.5 MG/2ML IN SUSP
0.5000 mg | Freq: Two times a day (BID) | RESPIRATORY_TRACT | Status: DC
  Administered 2019-05-14 – 2019-05-17 (×7): 0.5 mg via RESPIRATORY_TRACT
  Filled 2019-05-14 (×7): qty 2

## 2019-05-14 MED ORDER — HEPARIN BOLUS VIA INFUSION
4000.0000 [IU] | Freq: Once | INTRAVENOUS | Status: AC
  Administered 2019-05-14: 05:00:00 4000 [IU] via INTRAVENOUS
  Filled 2019-05-14: qty 4000

## 2019-05-14 MED ORDER — VANCOMYCIN HCL IN DEXTROSE 1-5 GM/200ML-% IV SOLN
1000.0000 mg | Freq: Once | INTRAVENOUS | Status: DC
  Filled 2019-05-14: qty 200

## 2019-05-14 MED ORDER — IPRATROPIUM-ALBUTEROL 0.5-2.5 (3) MG/3ML IN SOLN
RESPIRATORY_TRACT | Status: AC
  Filled 2019-05-14: qty 3

## 2019-05-14 NOTE — ED Notes (Signed)
Only 1 set of Blood cultures collected d/t pt already having 2 IV access sites and antibiotics being ordered at this time.

## 2019-05-14 NOTE — Progress Notes (Signed)
Patient was admitted today morning with respiratory failure and BiPAP use.  I have seen briefly in ICU and discussed the care with ICU physician.  Continue to monitor as mentioned by Dr. Marcille Blanco and as per the ICU team.

## 2019-05-14 NOTE — ED Notes (Signed)
ED TO INPATIENT HANDOFF REPORT  ED Nurse Name and Phone #: Daiva Nakayama RN [phone]  S Name/Age/Gender Brandon Bolton 63 y.o. male Room/Bed: ED08A/ED08A  Code Status   Code Status: Prior  Home/SNF/Other Home Patient oriented to: self, place, time and situation Is this baseline? Yes   Triage Complete: Triage complete  Chief Complaint Breathing Difficulty  Triage Note Pt arrives to ED via [facility] from home with c/o [facility] x3-4 days. EMS reports pt with diminished breath sounds in all lung fields. PT was d/x'd and treated for PNA in May of 2020. Pt denies c/o fever, no reported N/V/D. Pt arrives on C-Pap; Dr Quentin Cornwall at bedside upon pt's arrival to ED.    Allergies No Known Allergies  Level of Care/Admitting Diagnosis ED Disposition    ED Disposition Condition [facility]: [place] [[id number]]  Level of Care: Stepdown [14]  Covid Evaluation: Confirmed COVID Negative  Diagnosis: Acute on chronic respiratory failure with hypoxemia [facility]) [[id number]]  Admitting Physician: Harrie Foreman [[id number]]  Attending Physician: Harrie Foreman [[id number]]  Estimated length of stay: past midnight tomorrow  Certification:: I certify this patient will need inpatient services for at least 2 midnights  PT Class (Do Not Modify): Inpatient [101]  PT Acc Code (Do Not Modify): Private [1]       B Medical/Surgery History Past Medical History:  Diagnosis Date  . COPD (chronic obstructive pulmonary disease) ([place])   . Diabetes mellitus, type 2 ([place])   . Drug abuse ([place])   . Dupuytren's contracture   . ETOH abuse 2018  . Hypertension   . Mixed hyperlipidemia 01/2018   Past Surgical History:  Procedure Laterality Date  . HAND SURGERY       A IV Location/Drains/Wounds Patient Lines/Drains/Airways Status   Active Line/Drains/Airways    Name:   Placement date:   Placement time:   Site:   Days:   Peripheral IV 05/14/19 Left Antecubital   05/14/19    0106     Antecubital   less than 1   Peripheral IV 05/14/19 Right Antecubital   05/14/19    0125    Antecubital   less than 1          Intake/Output Last 24 hours  Intake/Output Summary (Last 24 hours) at 05/14/2019 0257 Last data filed at 05/14/2019 4709 Gross per 24 hour  Intake 100 ml  Output -  Net 100 ml    Labs/Imaging Results for orders placed or performed during the hospital encounter of 05/14/19 (from the past 48 hour(s))  Lactic acid, plasma     Status: Abnormal   Collection Time: 05/14/19  1:13 AM  Result Value Ref Range   Lactic Acid, Venous 2.3 (HH) 0.5 - 1.9 mmol/L    Comment: CRITICAL RESULT CALLED TO, READ BACK BY AND VERIFIED WITH BUTCH WOODS RN AT 0209 ON 05/14/2019 SNG Performed at [facility], [address] [place], [place] [address]   Comprehensive metabolic panel     Status: Abnormal   Collection Time: 05/14/19  1:13 AM  Result Value Ref Range   Sodium 121 (L) 135 - 145 mmol/L   Potassium 4.5 3.5 - 5.1 mmol/L   Chloride 86 (L) 98 - 111 mmol/L   CO2 20 (L) 22 - 32 mmol/L   Glucose, Bld 124 (H) 70 - 99 mg/dL   BUN 18 8 - 23 mg/dL   Creatinine, Ser 0.74 0.61 - 1.24 mg/dL   Calcium 9.1  8.9 - 10.3 mg/dL   Total Protein 7.5 6.5 - 8.1 g/dL   Albumin 4.5 3.5 - 5.0 g/dL   AST 35 15 - 41 U/L   ALT 27 0 - 44 U/L   Alkaline Phosphatase 65 38 - 126 U/L   Total Bilirubin 1.1 0.3 - 1.2 mg/dL   GFR calc non Af Amer >60 >60 mL/min   GFR calc Af Amer >60 >60 mL/min   Anion gap 15 5 - 15    Comment: Performed at [facility], [address] [place], [place] [address]  CBC WITH DIFFERENTIAL     Status: Abnormal   Collection Time: 05/14/19  1:13 AM  Result Value Ref Range   WBC 9.5 4.0 - 10.5 K/uL   RBC 4.73 4.22 - 5.81 MIL/uL   Hemoglobin 15.9 13.0 - 17.0 g/dL   HCT 40.944.5 81.139.0 - 91.452.0 %   MCV 94.1 80.0 - 100.0 fL   MCH 33.6 26.0 - 34.0 pg   MCHC 35.7 30.0 - 36.0 g/dL   RDW 78.213.2 95.611.5 - 21.315.5 %   Platelets 272 150 - 400 K/uL   nRBC 0.0 0.0 - 0.2 %    Neutrophils Relative % 83 %   Neutro Abs 7.8 (H) 1.7 - 7.7 K/uL   Lymphocytes Relative 9 %   Lymphs Abs 0.9 0.7 - 4.0 K/uL   Monocytes Relative 8 %   Monocytes Absolute 0.7 0.1 - 1.0 K/uL   Eosinophils Relative 0 %   Eosinophils Absolute 0.0 0.0 - 0.5 K/uL   Basophils Relative 0 %   Basophils Absolute 0.0 0.0 - 0.1 K/uL   Immature Granulocytes 0 %   Abs Immature Granulocytes 0.03 0.00 - 0.07 K/uL    Comment: Performed at [facility], [address] [place], [place] [address]  Brain natriuretic peptide     Status: Abnormal   Collection Time: 05/14/19  1:13 AM  Result Value Ref Range   B Natriuretic Peptide 765.0 (H) 0.0 - 100.0 pg/mL    Comment: Performed at [facility], [address] [place], [place] [address]  Procalcitonin     Status: None   Collection Time: 05/14/19  1:13 AM  Result Value Ref Range   Procalcitonin 0.19 ng/mL    Comment:        Interpretation: PCT (Procalcitonin) <= 0.5 ng/mL: Systemic infection (sepsis) is not likely. Local bacterial infection is possible. (NOTE)       Sepsis PCT Algorithm           Lower Respiratory Tract                                      Infection PCT Algorithm    ----------------------------     ----------------------------         PCT < 0.25 ng/mL                PCT < 0.10 ng/mL         Strongly encourage             Strongly discourage   discontinuation of antibiotics    initiation of antibiotics    ----------------------------     -----------------------------       PCT 0.25 - 0.50 ng/mL            PCT 0.10 - 0.25 ng/mL  OR       >80% decrease in PCT            Discourage initiation of                                            antibiotics      Encourage discontinuation           of antibiotics    ----------------------------     -----------------------------         PCT >= 0.50 ng/mL              PCT 0.26 - 0.50 ng/mL               AND        <80% decrease in PCT             Encourage  initiation of                                             antibiotics       Encourage continuation           of antibiotics    ----------------------------     -----------------------------        PCT >= 0.50 ng/mL                  PCT > 0.50 ng/mL               AND         increase in PCT                  Strongly encourage                                      initiation of antibiotics    Strongly encourage escalation           of antibiotics                                     -----------------------------                                           PCT <= 0.25 ng/mL                                                 OR                                        > 80% decrease in PCT                                     Discontinue / Do not initiate  antibiotics Performed at [facility], [address] [place], [place] [address]   SARS Coronavirus 2 (CEPHEID- Performed in [facility] hospital lab), Hosp Order     Status: None   Collection Time: 05/14/19  1:13 AM   Specimen: Nasopharyngeal Swab  Result Value Ref Range   SARS Coronavirus 2 NEGATIVE NEGATIVE    Comment: (NOTE) If result is NEGATIVE SARS-CoV-2 target nucleic acids are NOT DETECTED. The SARS-CoV-2 RNA is generally detectable in upper and lower  respiratory specimens during the acute phase of infection. The lowest  concentration of SARS-CoV-2 viral copies this assay can detect is 250  copies / mL. A negative result does not preclude SARS-CoV-2 infection  and should not be used as the sole basis for treatment or other  patient management decisions.  A negative result may occur with  improper specimen collection / handling, submission of specimen other  than nasopharyngeal swab, presence of viral mutation(s) within the  areas targeted by this assay, and inadequate number of viral copies  (<250 copies / mL). A negative result must be combined with clinical  observations,  patient history, and epidemiological information. If result is POSITIVE SARS-CoV-2 target nucleic acids are DETECTED. The SARS-CoV-2 RNA is generally detectable in upper and lower  respiratory specimens dur ing the acute phase of infection.  Positive  results are indicative of active infection with SARS-CoV-2.  Clinical  correlation with patient history and other diagnostic information is  necessary to determine patient infection status.  Positive results do  not rule out bacterial infection or co-infection with other viruses. If result is PRESUMPTIVE POSTIVE SARS-CoV-2 nucleic acids MAY BE PRESENT.   A presumptive positive result was obtained on the submitted specimen  and confirmed on repeat testing.  While 2019 novel coronavirus  (SARS-CoV-2) nucleic acids may be present in the submitted sample  additional confirmatory testing may be necessary for epidemiological  and / or clinical management purposes  to differentiate between  SARS-CoV-2 and other Sarbecovirus currently known to infect humans.  If clinically indicated additional testing with an alternate test  methodology [phone]([id number]) is advised. The SARS-CoV-2 RNA is generally  detectable in upper and lower respiratory sp ecimens during the acute  phase of infection. The expected result is Negative. Fact Sheet for Patients:  BoilerBrush.com.cyhttps://www.fda.gov/media/136312/download Fact Sheet for Healthcare Providers: https://pope.com/https://www.fda.gov/media/136313/download This test is not yet approved or cleared by the [place] FDA and has been authorized for detection and/or diagnosis of SARS-CoV-2 by FDA under an Emergency Use Authorization (EUA).  This EUA will remain in effect (meaning this test can be used) for the duration of the COVID-19 declaration under Section 564(b)(1) of the Act, 21 U.S.C. section 360bbb-3(b)(1), unless the authorization is terminated or revoked sooner. Performed at [facility], [address]  [place], [place] [address]    Dg Chest Portable 1 View  Result Date: 05/14/2019 CLINICAL DATA:  Respiratory distress EXAM: PORTABLE CHEST 1 VIEW COMPARISON:  03/05/2019 FINDINGS: Borderline to mild cardiomegaly. No focal consolidation. Diffuse bilateral streaky and interstitial pulmonary opacity which may be secondary to acute interstitial inflammatory process on underlying chronic lung disease. Consider also viral or atypical pneumonia. No pneumothorax. IMPRESSION: Diffuse bilateral interstitial and streaky lung opacity which may be due to acute interstitial inflammatory process on underlying chronic change. Differential also includes viral or atypical pneumonia. Electronically Signed   By: Jasmine PangKim  Fujinaga M.D.   On: 05/14/2019 01:23    Pending Labs Unresulted Labs (From admission, onward)    Start  Ordered   05/14/19 0108  Lactic acid, plasma  STAT Now then every 3 hours,   STAT     05/14/19 0107   05/14/19 0108  Blood Culture (routine x 2)  BLOOD CULTURE X 2,   STAT     05/14/19 0107   05/14/19 0108  Urinalysis, Complete w Microscopic  ONCE - STAT,   STAT     05/14/19 0107   Signed and Held  Creatinine, serum  (enoxaparin (LOVENOX)    CrCl >/= 30 ml/min)  Weekly,   R    Comments: while on enoxaparin therapy    Signed and Held   Signed and Held  TSH  Add-on,   R     Signed and Held          Vitals/Pain Today's Vitals   05/14/19 0110 05/14/19 0157 05/14/19 0219 05/14/19 0224  BP:  129/79 135/80   Pulse:  (!) 104 (!) 103   Resp:  20 20   Temp:    (!) 97.3 F (36.3 C)  TempSrc:    Axillary  SpO2:  96% 97%   Weight: 65.8 kg     Height: 5\' 8"  (1.727 m)     PainSc: 0-No pain       Isolation Precautions No active isolations  Medications Medications  vancomycin (VANCOCIN) 1,500 mg in sodium chloride 0.9 % 500 mL IVPB (1,500 mg Intravenous New Bag/Given 05/14/19 0218)  metoprolol tartrate (LOPRESSOR) injection 5 mg (has no administration in time range)  ipratropium-albuterol  (DUONEB) 0.5-2.5 (3) MG/3ML nebulizer solution 3 mL (3 mLs Nebulization Given 05/14/19 0123)  ceFEPIme (MAXIPIME) 2 g in sodium chloride 0.9 % 100 mL IVPB (0 g Intravenous Stopped 05/14/19 0232)  sodium chloride 0.9 % bolus 500 mL (500 mLs Intravenous New Bag/Given 05/14/19 0245)    Mobility walks Low fall risk   Focused Assessments    R Recommendations: See Admitting Provider Note  Report given to:   Additional Notes: None

## 2019-05-14 NOTE — ED Notes (Signed)
Dr Quentin Cornwall made aware at this time of pt's elevated Lactic Acid level as reported by lab. Lactic Acid 2.3 mmol/L

## 2019-05-14 NOTE — Consult Note (Signed)
Name: Brandon Bolton MRN: [id number] DOB: Aug 31, 1956    ADMISSION DATE:  05/14/2019 CONSULTATION DATE:  05/14/2019  REFERRING MD :  Joycelyn Ruaiamond, Michael, MD  CHIEF COMPLAINT:  Shortness of Breath  BRIEF PATIENT DESCRIPTION: 63 yo male with elevated troponins secondary to demand ischemia vs NSTEMI, acute hypoxic respiratory failure secondary to acute CHF exacerbation and possible pneumonia requiring Bipap.  SIGNIFICANT EVENTS / STUDIES:  7/14 > Admitted to Stepdown unit requiring Bipap  CULTURES 7/14 > COVID negative 7/14 > BC/UA/sputum culture, pending 7/14 > Urine toxicology 7/14 > MRSA PCR, pending  HISTORY OF PRESENT ILLNESS:  63 yo male with a recent echocardiogram showing HFrEF with diastolic dysfunction (EF 35-40%) and a PMH of COPD, T2DM, HTN, Dupuytren's contracture, HLD & tobacco /alcohol /polysubstance abuse brought via EMS from home after 4-5 days of worsening dyspnea and BLE edema. Per EMS pt was hypoxic in the 60's% on arrivaland after nebulizers & steroids required Cpap for EMS transport. Upon arrival to ED pt remained in respiratory distress therefore transitioned to Bipap.  Significant diagnostics: troponin- 338, lactic- 2.3, Na- 121, BNP- 765, serum CO2- 20, ABG: 7.24~42~120~18, CXR demonstrating interstitial edema with bibasilar opacities concerning for pleural effusions/pneumonia.  Pt denies fever, N/V/D and denies chest pain.  He admits to continuing to smoke, but reports "cutting back" to 1/2 a pack a day. He denies illicit drug use for the last 5 years, but does admit to drinking 5-6 beers daily- last drink just prior to admission. [facility] with PCCM consult for further monitoring and treatment.  PAST MEDICAL HISTORY :   has a past medical history of COPD (chronic obstructive pulmonary disease) (HCC), Diabetes mellitus, type 2 (HCC), Drug abuse (HCC), Dupuytren's contracture, ETOH abuse (2018), Hypertension, and Mixed hyperlipidemia (01/2018).   has a past surgical history that includes Hand surgery. Prior to Admission medications   Medication Sig Start Date End Date Taking? Authorizing Provider  amLODipine (NORVASC) 10 MG tablet Take 1 tablet (10 mg total) by mouth daily. 03/05/19   Doles-Johnson, Teah, NP  cetirizine (ZYRTEC) 10 MG tablet Take 1 tablet by mouth once daily 04/01/19   Doles-Johnson, Teah, NP  diphenhydrAMINE (BENADRYL) 25 mg capsule Take 25 mg by mouth every 6 (six) hours as needed for itching, allergies or sleep.     [provider]  meloxicam (MOBIC) 15 MG tablet Daily. Patient taking differently: Take 15 mg by mouth daily.  02/28/19   Virl Axehaplin, Don C, MD  neomycin-polymyxin-hydrocortisone (CORTISPORIN) OTIC solution Place 4 drops into the left ear 4 (four) times daily. Patient not taking: Reported on 03/05/2019 11/27/18   Iloabachie, Chioma E, NP  nicotine (NICODERM CQ - DOSED IN MG/24 HOURS) 21 mg/24hr patch Place 1 patch (21 mg total) onto the skin daily. 03/12/19   Gouru, Deanna ArtisAruna, MD  predniSONE (STERAPRED UNI-PAK 21 TAB) 10 MG (21) TBPK tablet Take 1 tablet (10 mg total) by mouth daily. Take 6 tablets by mouth for 1 day followed by  5 tablets by mouth for 1 day followed by  4 tablets by mouth for 1 day followed by  3 tablets by mouth for 1 day followed by  2 tablets by mouth for 1 day followed by  1 tablet by mouth for a day and stop 03/11/19   Ramonita LabGouru, Aruna, MD  SYMBICORT 160-4.5 MCG/ACT inhaler Inhale 2 puffs into the lungs 2 (two) times daily. 03/05/19   Doles-Johnson, Teah, NP  VENTOLIN HFA 108 (90 Base) MCG/ACT inhaler INHALE  2 PUFFS BY MOUTH EVERY 6 HOURS AS NEEDED FOR WHEEZING OR SHORTNESS OF BREATH 03/11/19   Nicholes Mango, MD   No Known Allergies  FAMILY HISTORY:  family history includes Allergic Disorder in his mother; Arthritis in his father; Cancer in his father; Heart disease in his father; Hypertension in his father. SOCIAL HISTORY:  reports that he has been smoking cigarettes. He has a 40.00 pack-year  smoking history. He has never used smokeless tobacco. He reports that he does not drink alcohol or use drugs.  REVIEW OF SYSTEMS:  Positives in BOLD Constitutional: Negative for fever, chills, weight loss, malaise/fatigue and diaphoresis.  HENT: Negative for hearing loss, ear pain, nosebleeds, congestion, sore throat, neck pain, tinnitus and ear discharge.   Eyes: Negative for blurred vision, double vision, photophobia, pain, discharge and redness.  Respiratory: Negative for cough, hemoptysis, sputum production, shortness of breath, wheezing and stridor.   Cardiovascular: Negative for chest pain, palpitations, orthopnea, claudication, leg swelling and PND.  Gastrointestinal: Negative for heartburn, nausea, vomiting, abdominal pain, diarrhea, constipation, blood in stool and melena.  Genitourinary: Negative for dysuria, urgency, frequency, hematuria and flank pain.  Musculoskeletal: Negative for myalgias, back pain, joint pain and falls.  Skin: Negative for itching and rash.  Neurological: Negative for dizziness, tingling, tremors, sensory change, speech change, focal weakness, seizures, loss of consciousness, weakness and headaches.  Endo/Heme/Allergies: Negative for environmental allergies and polydipsia. Does not bruise/bleed easily.  SUBJECTIVE: Upon arrival to [facility] unit, patient in severe respiratory distress reporting severe shortness of breath on Bipap.  VITAL SIGNS: Temp:  [97.3 F (36.3 C)] 97.3 F (36.3 C) (07/14 0224) Pulse Rate:  [103-104] 103 (07/14 0219) Resp:  [20] 20 (07/14 0219) BP: (129-135)/(79-80) 135/80 (07/14 0219) SpO2:  [96 %-97 %] 97 % (07/14 0219) FiO2 (%):  [28 %] 28 % (07/14 0219) Weight:  [65.8 kg] 65.8 kg (07/14 0110)  PHYSICAL EXAMINATION:  General:  Pt sitting up in bed, critically ill, speaking in fragments Neuro:  A&O x 4, no focal motor/sensory deficits, PERRLA HEENT:  Atraumatic, normocephalic, no scleral icterus, neck supple Cardiovascular:  S1,  S2, no murmurs/rubs/gallops, JVD, +3 BLE edema, +2 palpable pulses  Lungs:  Auscultated diminished wheezing in BUL & diffuse crackles Abdomen:  Rounded, distended with active bowel sounds Musculoskeletal:  5/5 strength throughout Skin:  Limited exam, no rashes/lesions/ulcerations present  Recent Labs  Lab 05/14/19 0113  NA 121*  K 4.5  CL 86*  CO2 20*  BUN 18  CREATININE 0.74  GLUCOSE 124*   Recent Labs  Lab 05/14/19 0113  HGB 15.9  HCT 44.5  WBC 9.5  PLT 272   Dg Chest Portable 1 View  Result Date: 05/14/2019 CLINICAL DATA:  Respiratory distress EXAM: PORTABLE CHEST 1 VIEW COMPARISON:  03/05/2019 FINDINGS: Borderline to mild cardiomegaly. No focal consolidation. Diffuse bilateral streaky and interstitial pulmonary opacity which may be secondary to acute interstitial inflammatory process on underlying chronic lung disease. Consider also viral or atypical pneumonia. No pneumothorax. IMPRESSION: Diffuse bilateral interstitial and streaky lung opacity which may be due to acute interstitial inflammatory process on underlying chronic change. Differential also includes viral or atypical pneumonia. Electronically Signed   By: Donavan Foil M.D.   On: 05/14/2019 01:23    ASSESSMENT / PLAN:  Acute Hypoxic Respiratory Failure secondary to Acute CHF exacerbation and COPD exacerbation Possible Pneumonia Hx: COPD, HFrEF with diastolic dysfunction, current smoker - Supplemental oxygen as needed to maintain SpO2 > 92%- wean Bipap as tolerated - Add Solu-medrol  60 mg BID - Add scheduled Duo nebs Q 6 & pulmicort nebs BID - Continuous SpO2 monitoring - smoking cessation counseling provided  Elevated troponin secondary to demand ischemia vs N~STEMI - Lasix 20 mg given for gentle diuresis - Trend troponin & monitor BMP daily - Heparin gtt per pharmacy for ACS/STEMI protocol - Cardiology consult for new HFrEF with diastolic dysfunction diagnosis - Continuous cardiac monitoring  Possible  Pneumonia - Cefepime & Vanc x 1 dose given for possible CAP - Will discontinue vancomycin if MRSA PCR is negative, continue cefepime for now - trend lactic / procalcitonin / WBC / fever curve - obtain sputum culture, monitor blood culture results - adjust ABX as indicated  Hyponatremia suspected secondary to beer potomania (drinks 5-6 beers daily)- improving - monitor BMP - monitor LOC - strict I&O's  ETOH abuse - CIWA monitoring ordered per protocol - ativan PRN Q 6 - thiamine/folic acid ordered daily - monitor BMP- correct electrolyte imbalances as needed   Sonda Rumbleana , AGNP  Pulmonary/Critical Care Pager [phone] (please enter 7 digits) PCCM Consult Pager [phone] (please enter 7 digits)

## 2019-05-14 NOTE — Progress Notes (Signed)
*  PRELIMINARY RESULTS* Echocardiogram 2D Echocardiogram has been performed.  Sherrie Sport 05/14/2019, 3:06 PM

## 2019-05-14 NOTE — Progress Notes (Signed)
Pt received from ED via [place] to rm 15. A & O x 4. GCS=15. Labor breathing with exp. Wheezes & rhonchi; on Bipap, VS stable. he denies pain @ this time. See flow sheet for detail assessment. Will continue to monitor pt closely. Marland Kitchen

## 2019-05-14 NOTE — Progress Notes (Signed)
Pharmacy Antibiotic Note  Brandon Bolton is a 63 y.o. male admitted on 05/14/2019 with pneumonia.  Pharmacy has been consulted for vanc/cefepime dosing.  Plan: Patient received vanc 1.5g IV load and cefepime 2g IV x 1 in ED   Vancomycin 1000 mg IV Q 12 hrs. Goal AUC 400-550. Expected AUC: 519.5  SCr used: 0.8 Cssmin: 13.9 mcg/mL  Will continue cefepime 2g IV q8h per CrCl > 60 ml/min. Will continue to monitor and adjust doses as necessary.  Height: 5\' 8"  (172.7 cm) Weight: 145 lb (65.8 kg) IBW/kg (Calculated) : 68.4  Temp (24hrs), Avg:97.3 F (36.3 C), Min:97.3 F (36.3 C), Max:97.3 F (36.3 C)  Recent Labs  Lab 05/14/19 0113  WBC 9.5  CREATININE 0.74  LATICACIDVEN 2.3*    Estimated Creatinine Clearance: 89.1 mL/min (by C-G formula based on SCr of 0.74 mg/dL).    No Known Allergies   Thank you for allowing pharmacy to be a part of this patient's care.  Tobie Lords, PharmD, BCPS Clinical Pharmacist 05/14/2019 4:28 AM

## 2019-05-14 NOTE — ED Triage Notes (Signed)
Pt arrives to ED via [facility] from home with c/o [facility] x3-4 days. EMS reports pt with diminished breath sounds in all lung fields. PT was d/x'd and treated for PNA in May of 2020. Pt denies c/o fever, no reported N/V/D. Pt arrives on C-Pap; Dr Quentin Cornwall at bedside upon pt's arrival to ED.

## 2019-05-14 NOTE — Progress Notes (Signed)
ANTICOAGULATION CONSULT NOTE  Pharmacy Consult for heparin Indication: chest pain/ACS  Patient Measurements: Height: 5\' 8"  (172.7 cm) Weight: 145 lb (65.8 kg) IBW/kg (Calculated) : 68.4 kg Heparin Dosing Weight: 65.8 kg  Vital Signs: Temp: 97 F (36.1 C) (07/14 0430) Temp Source: Axillary (07/14 0430) BP: 119/79 (07/14 0600) Pulse Rate: 89 (07/14 0600)  Labs: Recent Labs    05/14/19 0113 05/14/19 0240 05/14/19 0527  HGB 15.9  --   --   HCT 44.5  --   --   PLT 272  --   --   APTT  --   --  >160*  LABPROT  --   --  13.5  INR  --   --  1.0  CREATININE 0.74  --  0.82  TROPONINIHS  --  338* 317*    Estimated Creatinine Clearance: 86.9 mL/min (by C-G formula based on SCr of 0.82 mg/dL).   Medical History: Past Medical History:  Diagnosis Date  . CHF (congestive heart failure) (HCC)    EF: 35-40%, global hypokinesis, mod. mitral regurgitation (03/2019)  . COPD (chronic obstructive pulmonary disease) ([place])   . Diabetes mellitus, type 2 ([place])   . Drug abuse ([place])   . Dupuytren's contracture   . ETOH abuse 2018  . Hypertension   . Mixed hyperlipidemia 01/2018    Medications:  Scheduled:  . albuterol  2.5 mg Nebulization Q4H  . budesonide (PULMICORT) nebulizer solution  0.5 mg Nebulization BID  . chlorhexidine  15 mL Mouth Rinse BID  . Chlorhexidine Gluconate Cloth  6 each Topical Q0600  . docusate sodium  100 mg Oral BID  . folic acid  1 mg Oral Daily  . mouth rinse  15 mL Mouth Rinse q12n4p  . methylPREDNISolone (SOLU-MEDROL) injection  40 mg Intravenous Q12H  . multivitamin with minerals  1 tablet Oral Daily  . thiamine  100 mg Oral Daily   Or  . thiamine  100 mg Intravenous Daily    Assessment: Patient admitted x SOB was here prior in may for PNA. Initial trop was 337 ng/mL,  Patient is not on anticoagulation PTA. Patient is being started on heparin drip for ACS.  Goal of Therapy:  Heparin level 0.3-0.7 units/ml Monitor platelets by anticoagulation  protocol: Yes  Heparin Course: 7/14 am initiation: bolus 4000 units IV x 1, then 1050 units/hr  7/14 1021 HL 0.30   Plan:   Heparin level is therapeutic but borderline  Increase rate to 1150 units/hr: this is less than 2 units/kg/hr increase that would be recommended for a subtherapeutic level   H&H, PLT wnl at baseline: f/u CBC in am  check anti-Xa 6 hours after infusion rate change  Will monitor daily CBC's and adjust per anti-Xa levels.  Vallery Sa, PharmD Clinical Pharmacist 05/14/2019,8:10 AM

## 2019-05-14 NOTE — H&P (Signed)
Brandon Bolton is an 63 y.o. male.   Chief Complaint: Shortness of breath HPI: The patient with past medical history of CHF, COPD, hypertension and diabetes presents to the emergency department complaining of shortness of breath.  The patient states that his symptoms began this afternoon and rapidly progressed.  He denies chest pain, nausea, vomiting or diaphoresis.  In the emergency department he was saturating 60% on room air prior to the initiation of BiPAP.  The patient received Solu-Medrol in route as well as multiple breathing treatments without significant improvement which prompted the emergency department staff to call the hospitalist service for admission.  Past Medical History:  Diagnosis Date  . CHF (congestive heart failure) (HCC)    EF: 35-40%, global hypokinesis, mod. mitral regurgitation (03/2019)  . COPD (chronic obstructive pulmonary disease) (HCC)   . Diabetes mellitus, type 2 (HCC)   . Drug abuse (HCC)   . Dupuytren's contracture   . ETOH abuse 2018  . Hypertension   . Mixed hyperlipidemia 01/2018    Past Surgical History:  Procedure Laterality Date  . HAND SURGERY      Family History  Problem Relation Age of Onset  . Hypertension Father   . Heart disease Father   . Arthritis Father   . Cancer Father   . Allergic Disorder Mother    Social History:  reports that he has been smoking cigarettes. He has a 40.00 pack-year smoking history. He has never used smokeless tobacco. He reports that he does not drink alcohol or use drugs.  Allergies: No Known Allergies  Medications Prior to Admission  Medication Sig Dispense Refill  . amLODipine (NORVASC) 10 MG tablet Take 1 tablet (10 mg total) by mouth daily. 90 tablet 2  . cetirizine (ZYRTEC) 10 MG tablet Take 1 tablet by mouth once daily 30 tablet 0  . diphenhydrAMINE (BENADRYL) 25 mg capsule Take 25 mg by mouth every 6 (six) hours as needed for itching, allergies or sleep.     . meloxicam (MOBIC) 15 MG tablet Daily.  (Patient taking differently: Take 15 mg by mouth daily. ) 90 tablet 1  . neomycin-polymyxin-hydrocortisone (CORTISPORIN) OTIC solution Place 4 drops into the left ear 4 (four) times daily. (Patient not taking: Reported on 03/05/2019) 10 mL 0  . nicotine (NICODERM CQ - DOSED IN MG/24 HOURS) 21 mg/24hr patch Place 1 patch (21 mg total) onto the skin daily. 28 patch 0  . predniSONE (STERAPRED UNI-PAK 21 TAB) 10 MG (21) TBPK tablet Take 1 tablet (10 mg total) by mouth daily. Take 6 tablets by mouth for 1 day followed by  5 tablets by mouth for 1 day followed by  4 tablets by mouth for 1 day followed by  3 tablets by mouth for 1 day followed by  2 tablets by mouth for 1 day followed by  1 tablet by mouth for a day and stop 21 tablet 0  . SYMBICORT 160-4.5 MCG/ACT inhaler Inhale 2 puffs into the lungs 2 (two) times daily. 11 g 1  . VENTOLIN HFA 108 (90 Base) MCG/ACT inhaler INHALE 2 PUFFS BY MOUTH EVERY 6 HOURS AS NEEDED FOR WHEEZING OR SHORTNESS OF BREATH 18 each 1    Results for orders placed or performed during the hospital encounter of 05/14/19 (from the past 48 hour(s))  Lactic acid, plasma     Status: Abnormal   Collection Time: 05/14/19  1:13 AM  Result Value Ref Range   Lactic Acid, Venous 2.3 (HH) 0.5 - 1.9 mmol/L  Comment: CRITICAL RESULT CALLED TO, READ BACK BY AND VERIFIED WITH BUTCH WOODS RN AT 0209 ON 05/14/2019 SNG Performed at [facility], [address] [place], [place] [address]   Comprehensive metabolic panel     Status: Abnormal   Collection Time: 05/14/19  1:13 AM  Result Value Ref Range   Sodium 121 (L) 135 - 145 mmol/L   Potassium 4.5 3.5 - 5.1 mmol/L   Chloride 86 (L) 98 - 111 mmol/L   CO2 20 (L) 22 - 32 mmol/L   Glucose, Bld 124 (H) 70 - 99 mg/dL   BUN 18 8 - 23 mg/dL   Creatinine, Ser 4.690.74 0.61 - 1.24 mg/dL   Calcium 9.1 8.9 - 62.910.3 mg/dL   Total Protein 7.5 6.5 - 8.1 g/dL   Albumin 4.5 3.5 - 5.0 g/dL   AST 35 15 - 41 U/L   ALT 27 0 - 44 U/L    Alkaline Phosphatase 65 38 - 126 U/L   Total Bilirubin 1.1 0.3 - 1.2 mg/dL   GFR calc non Af Amer >60 >60 mL/min   GFR calc Af Amer >60 >60 mL/min   Anion gap 15 5 - 15    Comment: Performed at [facility], [address] [place], [place] [address]  CBC WITH DIFFERENTIAL     Status: Abnormal   Collection Time: 05/14/19  1:13 AM  Result Value Ref Range   WBC 9.5 4.0 - 10.5 K/uL   RBC 4.73 4.22 - 5.81 MIL/uL   Hemoglobin 15.9 13.0 - 17.0 g/dL   HCT 32.444.5 40.139.0 - 02.752.0 %   MCV 94.1 80.0 - 100.0 fL   MCH 33.6 26.0 - 34.0 pg   MCHC 35.7 30.0 - 36.0 g/dL   RDW 25.313.2 66.411.5 - 40.315.5 %   Platelets 272 150 - 400 K/uL   nRBC 0.0 0.0 - 0.2 %   Neutrophils Relative % 83 %   Neutro Abs 7.8 (H) 1.7 - 7.7 K/uL   Lymphocytes Relative 9 %   Lymphs Abs 0.9 0.7 - 4.0 K/uL   Monocytes Relative 8 %   Monocytes Absolute 0.7 0.1 - 1.0 K/uL   Eosinophils Relative 0 %   Eosinophils Absolute 0.0 0.0 - 0.5 K/uL   Basophils Relative 0 %   Basophils Absolute 0.0 0.0 - 0.1 K/uL   Immature Granulocytes 0 %   Abs Immature Granulocytes 0.03 0.00 - 0.07 K/uL    Comment: Performed at [facility], [address] [place], [place] [address]  Brain natriuretic peptide     Status: Abnormal   Collection Time: 05/14/19  1:13 AM  Result Value Ref Range   B Natriuretic Peptide 765.0 (H) 0.0 - 100.0 pg/mL    Comment: Performed at [facility], [address] [place], [place] [address]  Procalcitonin     Status: None   Collection Time: 05/14/19  1:13 AM  Result Value Ref Range   Procalcitonin 0.19 ng/mL    Comment:        Interpretation: PCT (Procalcitonin) <= 0.5 ng/mL: Systemic infection (sepsis) is not likely. Local bacterial infection is possible. (NOTE)       Sepsis PCT Algorithm           Lower Respiratory Tract                                      Infection PCT Algorithm    ----------------------------     ----------------------------  PCT < 0.25 ng/mL                PCT  < 0.10 ng/mL         Strongly encourage             Strongly discourage   discontinuation of antibiotics    initiation of antibiotics    ----------------------------     -----------------------------       PCT 0.25 - 0.50 ng/mL            PCT 0.10 - 0.25 ng/mL               OR       >80% decrease in PCT            Discourage initiation of                                            antibiotics      Encourage discontinuation           of antibiotics    ----------------------------     -----------------------------         PCT >= 0.50 ng/mL              PCT 0.26 - 0.50 ng/mL               AND        <80% decrease in PCT             Encourage initiation of                                             antibiotics       Encourage continuation           of antibiotics    ----------------------------     -----------------------------        PCT >= 0.50 ng/mL                  PCT > 0.50 ng/mL               AND         increase in PCT                  Strongly encourage                                      initiation of antibiotics    Strongly encourage escalation           of antibiotics                                     -----------------------------                                           PCT <= 0.25 ng/mL  OR                                        > 80% decrease in PCT                                     Discontinue / Do not initiate                                             antibiotics Performed at [facility], [address] [place], [place] [address]   SARS Coronavirus 2 (CEPHEID- Performed in [facility] hospital lab), Hosp Order     Status: None   Collection Time: 05/14/19  1:13 AM   Specimen: Nasopharyngeal Swab  Result Value Ref Range   SARS Coronavirus 2 NEGATIVE NEGATIVE    Comment: (NOTE) If result is NEGATIVE SARS-CoV-2 target nucleic acids are NOT DETECTED. The SARS-CoV-2 RNA is generally detectable in  upper and lower  respiratory specimens during the acute phase of infection. The lowest  concentration of SARS-CoV-2 viral copies this assay can detect is 250  copies / mL. A negative result does not preclude SARS-CoV-2 infection  and should not be used as the sole basis for treatment or other  patient management decisions.  A negative result may occur with  improper specimen collection / handling, submission of specimen other  than nasopharyngeal swab, presence of viral mutation(s) within the  areas targeted by this assay, and inadequate number of viral copies  (<250 copies / mL). A negative result must be combined with clinical  observations, patient history, and epidemiological information. If result is POSITIVE SARS-CoV-2 target nucleic acids are DETECTED. The SARS-CoV-2 RNA is generally detectable in upper and lower  respiratory specimens dur ing the acute phase of infection.  Positive  results are indicative of active infection with SARS-CoV-2.  Clinical  correlation with patient history and other diagnostic information is  necessary to determine patient infection status.  Positive results do  not rule out bacterial infection or co-infection with other viruses. If result is PRESUMPTIVE POSTIVE SARS-CoV-2 nucleic acids MAY BE PRESENT.   A presumptive positive result was obtained on the submitted specimen  and confirmed on repeat testing.  While 2019 novel coronavirus  (SARS-CoV-2) nucleic acids may be present in the submitted sample  additional confirmatory testing may be necessary for epidemiological  and / or clinical management purposes  to differentiate between  SARS-CoV-2 and other Sarbecovirus currently known to infect humans.  If clinically indicated additional testing with an alternate test  methodology [phone]([id number]) is advised. The SARS-CoV-2 RNA is generally  detectable in upper and lower respiratory sp ecimens during the acute  phase of infection. The expected result is  Negative. Fact Sheet for Patients:  BoilerBrush.com.cyhttps://www.fda.gov/media/136312/download Fact Sheet for Healthcare Providers: https://pope.com/https://www.fda.gov/media/136313/download This test is not yet approved or cleared by the [place] FDA and has been authorized for detection and/or diagnosis of SARS-CoV-2 by FDA under an Emergency Use Authorization (EUA).  This EUA will remain in effect (meaning this test can be used) for the duration of the COVID-19 declaration under Section 564(b)(1) of the Act, 21 U.S.C. section 360bbb-3(b)(1), unless the authorization is terminated or revoked sooner.  Performed at [facility], [address] [place], [place] [address]   Troponin I (High Sensitivity)     Status: Abnormal   Collection Time: 05/14/19  2:40 AM  Result Value Ref Range   Troponin I (High Sensitivity) 338 (HH) <18 ng/L    Comment: CRITICAL RESULT CALLED TO, READ BACK BY AND VERIFIED WITH AMY SMITH RN AT 0310 ON 05/14/2019 SNG (NOTE) Elevated high sensitivity troponin I (hsTnI) values and significant  changes across serial measurements may suggest ACS but many other  chronic and acute conditions are known to elevate hsTnI results.  Refer to the "Links" section for chest pain algorithms and additional  guidance. Performed at [facility], [address] [place], [place] [address]   Blood gas, arterial     Status: Abnormal   Collection Time: 05/14/19  3:40 AM  Result Value Ref Range   FIO2 0.36    Delivery systems BILEVEL POSITIVE AIRWAY PRESSURE    LHR 10 resp/min   Inspiratory PAP 16    Expiratory PAP 8    pH, Arterial 7.24 (L) 7.350 - 7.450   pCO2 arterial 42 32.0 - 48.0 mmHg   pO2, Arterial 120 (H) 83.0 - 108.0 mmHg   Bicarbonate 18.0 (L) 20.0 - 28.0 mmol/L   Acid-base deficit 9.0 (H) 0.0 - 2.0 mmol/L   O2 Saturation 98.0 %   Patient temperature 37.0    Collection site RIGHT RADIAL    Sample type ARTERIAL DRAW    Allens test (pass/fail) PASS PASS    Comment:  Performed at [facility], [address] [place], [place] [address]  Glucose, capillary     Status: Abnormal   Collection Time: 05/14/19  4:28 AM  Result Value Ref Range   Glucose-Capillary 167 (H) 70 - 99 mg/dL   Dg Chest Portable 1 View  Result Date: 05/14/2019 CLINICAL DATA:  Respiratory distress EXAM: PORTABLE CHEST 1 VIEW COMPARISON:  03/05/2019 FINDINGS: Borderline to mild cardiomegaly. No focal consolidation. Diffuse bilateral streaky and interstitial pulmonary opacity which may be secondary to acute interstitial inflammatory process on underlying chronic lung disease. Consider also viral or atypical pneumonia. No pneumothorax. IMPRESSION: Diffuse bilateral interstitial and streaky lung opacity which may be due to acute interstitial inflammatory process on underlying chronic change. Differential also includes viral or atypical pneumonia. Electronically Signed   By: Jasmine PangKim  Fujinaga M.D.   On: 05/14/2019 01:23    Review of Systems  Constitutional: Negative for chills and fever.  HENT: Negative for sore throat and tinnitus.   Eyes: Negative for blurred vision and redness.  Respiratory: Positive for shortness of breath and wheezing. Negative for cough.   Cardiovascular: Negative for chest pain, palpitations, orthopnea and PND.  Gastrointestinal: Negative for abdominal pain, diarrhea, nausea and vomiting.  Genitourinary: Negative for dysuria, frequency and urgency.  Musculoskeletal: Negative for joint pain and myalgias.  Skin: Negative for rash.       No lesions  Neurological: Negative for speech change, focal weakness and weakness.  Endo/Heme/Allergies: Does not bruise/bleed easily.       No temperature intolerance  Psychiatric/Behavioral: Negative for depression and suicidal ideas.    Blood pressure (!) 138/91, pulse 96, temperature (!) 97.3 F (36.3 C), temperature source Axillary, resp. rate 20, height 5\' 8"  (1.727 m), weight 65.8 kg, SpO2 98 %. Physical Exam  Vitals  reviewed. Constitutional: He is oriented to person, place, and time. He appears well-developed and well-nourished. He appears distressed.  HENT:  Head: Normocephalic and atraumatic.  Mouth/Throat: Oropharynx is clear and moist.  Eyes: Pupils are equal, round, and reactive to light. Conjunctivae and EOM are normal. No scleral icterus.  Neck: Normal range of motion. Neck supple. No JVD present. No tracheal deviation present. No thyromegaly present.  Cardiovascular: Normal rate, regular rhythm and normal heart sounds. Exam reveals no gallop and no friction rub.  No murmur heard. Respiratory: He is in respiratory distress. He has wheezes.  Poor air movement  GI: Soft. Bowel sounds are normal. He exhibits no distension. There is no abdominal tenderness.  Genitourinary:    Genitourinary Comments: Deferred   Musculoskeletal: Normal range of motion.        General: No edema.  Lymphadenopathy:    He has no cervical adenopathy.  Neurological: He is alert and oriented to person, place, and time. No cranial nerve deficit.  Skin: Skin is warm and dry. No rash noted. No erythema.  Psychiatric: He has a normal mood and affect. His behavior is normal. Judgment and thought content normal.     Assessment/Plan This is a 63 year old male admitted for respiratory failure. 1.  Respiratory failure: Acute on chronic; with hypoxemia.  The patient is currently on BiPAP.  COVID negative.  No discrete infiltrate seen on x-[place] but the patient does have diffuse interstitial coarsening.  Continue scheduled duo nebs treatments.  The patient has received broad-spectrum antibiotics but procalcitonin is negative.  Does not consistently meet criteria for sepsis.  Defer further antimicrobial therapy to ICU team. 2.  COPD: With exacerbation; continue steroids as well as inhaled corticosteroid.  The patient will need an anticholinergic agent upon discharge. 3.  CHF: Systolic; acute on chronic.  BNP is elevated but in the  presence of elevated lactic acid as well.  The patient has received a small bolus of fluid but will likely need mild diuresis later.  Also noted following admission that high-sensitivity troponin is elevated which could represent sepsis but is also concerning for NSTEMI.  Further evaluation by cardiology recommended 4.  Hyponatremia: Secondary to chronic lung disease. 5.  DVT prophylaxis: Lovenox 6.  GI prophylaxis: None The patient is a full code.  I personally spent 45 minutes in critical care time with this patient  Harrie Foreman, MD 05/14/2019, 4:36 AM

## 2019-05-14 NOTE — Consult Note (Addendum)
[facility]  CARDIOLOGY CONSULT NOTE  Patient ID: Brandon Bolton MRN: [id number] DOB/AGE: 826-Dec-1957 63 y.o.  Admit date: 05/14/2019 Referring Physician Sonda Rumbleana Blakeney Primary Physician N/A Primary Cardiologist N/A Reason for Consultation Elevated troponin, cardiomyopathy   HPI: Mr. Brandon Bolton is a 63 year old male with a past medical history significant for cardiomyopathy, likely alcohol induced, with HFrEF, moderate mitral regurgitation, COPD, type 2 diabetes, and hypertension who presented to the ED on 05/14/19 with a few day history of worsening shortness of breath and lower extremity edema.  Workup in the ED was significant for O2 sats at 60%, BNP of 765, high sensitivity troponin of 338 and 317 respectively, lactic acid of 2.3 and sodium of 123.  ECG revealed normal sinus rhythm with left bundle branch block, no previous ECGs available for comparison.  He was started on BiPAP and given multiple breathing treatments with minimal improvement.  He admits to a history of polysubstance abuse, but is not currently using.  He admits to daily alcohol use, averaging 5 drinks a day.   On evaluation, Mr. Brandon Bolton denies chest pain or any recent episodes of chest pain.  He denies palpitations or heart racing sensation.  Shortness of breath and lower extremity swelling have mildly improved.  He is not followed by cardiology - echocardiogram on 03/06/19 revealed moderately reduced LV function with an EF estimated between 35-40% with global hypokinesis with moderate MR.   Review of systems complete and found to be negative unless listed above     Past Medical History:  Diagnosis Date  . CHF (congestive heart failure) (HCC)    EF: 35-40%, global hypokinesis, mod. mitral regurgitation (03/2019)  . COPD (chronic obstructive pulmonary disease) (HCC)   . Diabetes mellitus, type 2 (HCC)   . Drug abuse (HCC)   . Dupuytren's contracture   . ETOH abuse 2018  . Hypertension   . Mixed hyperlipidemia 01/2018    Past  Surgical History:  Procedure Laterality Date  . HAND SURGERY      Medications Prior to Admission  Medication Sig Dispense Refill Last Dose  . amLODipine (NORVASC) 10 MG tablet Take 1 tablet (10 mg total) by mouth daily. 90 tablet 2   . cetirizine (ZYRTEC) 10 MG tablet Take 1 tablet by mouth once daily 30 tablet 0   . diphenhydrAMINE (BENADRYL) 25 mg capsule Take 25 mg by mouth every 6 (six) hours as needed for itching, allergies or sleep.      . meloxicam (MOBIC) 15 MG tablet Daily. (Patient taking differently: Take 15 mg by mouth daily. ) 90 tablet 1   . neomycin-polymyxin-hydrocortisone (CORTISPORIN) OTIC solution Place 4 drops into the left ear 4 (four) times daily. (Patient not taking: Reported on 03/05/2019) 10 mL 0   . nicotine (NICODERM CQ - DOSED IN MG/24 HOURS) 21 mg/24hr patch Place 1 patch (21 mg total) onto the skin daily. 28 patch 0   . predniSONE (STERAPRED UNI-PAK 21 TAB) 10 MG (21) TBPK tablet Take 1 tablet (10 mg total) by mouth daily. Take 6 tablets by mouth for 1 day followed by  5 tablets by mouth for 1 day followed by  4 tablets by mouth for 1 day followed by  3 tablets by mouth for 1 day followed by  2 tablets by mouth for 1 day followed by  1 tablet by mouth for a day and stop 21 tablet 0   . SYMBICORT 160-4.5 MCG/ACT inhaler Inhale 2 puffs into the lungs 2 (two) times daily.  11 g 1   . VENTOLIN HFA 108 (90 Base) MCG/ACT inhaler INHALE 2 PUFFS BY MOUTH EVERY 6 HOURS AS NEEDED FOR WHEEZING OR SHORTNESS OF BREATH 18 each 1    Social History   Socioeconomic History  . Marital status: Single    Spouse name: Not on file  . Number of children: Not on file  . Years of education: Not on file  . Highest education level: Not on file  Occupational History  . Not on file  Social Needs  . Financial resource strain: Very hard  . Food insecurity    Worry: Sometimes true    Inability: Never true  . Transportation needs    Medical: No    Non-medical: No  Tobacco Use  .  Smoking status: Current Every Day Smoker    Packs/day: 1.00    Years: 40.00    Pack years: 40.00    Types: Cigarettes  . Smokeless tobacco: Never Used  Substance and Sexual Activity  . Alcohol use: No    Frequency: Never    Comment: 2 quarts of beer/day  . Drug use: No    Types: Heroin    Comment: former heroin user (inj)  . Sexual activity: Never  Lifestyle  . Physical activity    Days per week: 4 days    Minutes per session: 60 min  . Stress: Rather much  Relationships  . Social connections    Talks on phone: More than three times a week    Gets together: Once a week    Attends religious service: More than 4 times per year    Active member of club or organization: No    Attends meetings of clubs or organizations: Never    Relationship status: Divorced  . Intimate partner violence    Fear of current or ex partner: No    Emotionally abused: No    Physically abused: No    Forced sexual activity: No  Other Topics Concern  . Not on file  Social History Narrative   [place] [address] Consent Collected    Family History  Problem Relation Age of Onset  . Hypertension Father   . Heart disease Father   . Arthritis Father   . Cancer Father   . Allergic Disorder Mother       Review of systems complete and found to be negative unless listed above      PHYSICAL EXAM  General: Well developed, well nourished, on BiPAP HEENT:  Normocephalic and atramatic Neck:  No JVD.  Lungs: On BiPAP. Inspiratory and expiratory wheezes noted in upper lung fields bilaterally Heart: HRRR . Normal S1 and S2 without gallops or murmurs.  Abdomen: Bowel sounds are positive, abdomen soft and non-tender  Msk:  Back normal.  Normal strength and tone for age. Extremities: No clubbing or cyanosis. 2-3+ pitting edema in bilateral lower extremities  Neuro: Alert and oriented X 3. Psych:  Good affect, responds appropriately  Labs:   Lab Results  Component Value Date   WBC 9.5 05/14/2019   HGB 15.9  05/14/2019   HCT 44.5 05/14/2019   MCV 94.1 05/14/2019   PLT 272 05/14/2019    Recent Labs  Lab 05/14/19 0113 05/14/19 0527  NA 121* 123*  K 4.5 5.0  CL 86* 90*  CO2 20* 22  BUN 18 19  CREATININE 0.74 0.82  CALCIUM 9.1 8.5*  PROT 7.5  --   BILITOT 1.1  --   ALKPHOS 65  --  ALT 27  --   AST 35  --   GLUCOSE 124* 146*   Lab Results  Component Value Date   TROPONINI 0.12 (HH) 03/06/2019    Lab Results  Component Value Date   CHOL 214 (H) 11/21/2018   CHOL 130 02/13/2018   CHOL 178 06/01/2017   Lab Results  Component Value Date   HDL 145 11/21/2018   HDL 39 (L) 02/13/2018   HDL 56 06/01/2017   Lab Results  Component Value Date   LDLCALC 53 11/21/2018   LDLCALC 67 02/13/2018   LDLCALC 94 06/01/2017   Lab Results  Component Value Date   TRIG 80 11/21/2018   TRIG 122 02/13/2018   TRIG 138 06/01/2017   Lab Results  Component Value Date   CHOLHDL 1.5 11/21/2018   CHOLHDL 3.3 02/13/2018   CHOLHDL 3.2 06/01/2017   No results found for: LDLDIRECT    Radiology: Dg Chest Portable 1 View  Result Date: 05/14/2019 CLINICAL DATA:  Respiratory distress EXAM: PORTABLE CHEST 1 VIEW COMPARISON:  03/05/2019 FINDINGS: Borderline to mild cardiomegaly. No focal consolidation. Diffuse bilateral streaky and interstitial pulmonary opacity which may be secondary to acute interstitial inflammatory process on underlying chronic lung disease. Consider also viral or atypical pneumonia. No pneumothorax. IMPRESSION: Diffuse bilateral interstitial and streaky lung opacity which may be due to acute interstitial inflammatory process on underlying chronic change. Differential also includes viral or atypical pneumonia. Electronically Signed   By: Jasmine PangKim  Fujinaga M.D.   On: 05/14/2019 01:23    EKG: Sinus rhythm, left bundle branch block   ASSESSMENT AND PLAN:  1.  Cardiomyopathy, likely alcohol induced with EF   -Stressed the importance of refraining from alcohol consumption   -Will add  beta blocker, ACE/ARB for reduced systolic function at discharge  -Okay to continue use of Lasix with close monitoring of sodium   -Will trend third troponin, but in the absence of chest pain, unlikely to need invasive ischemic workup at this time  2.  Elevated troponin   -Will trend third troponin; further plan pending results  3.  Acute on chronic respiratory failure   -Continue BiPAP, wean off as tolerated   The history, physical exam findings, and plan of care were all discussed with Dr. Harold HedgeKenneth Hae Ahlers, and all decision making was made in collaboration.   Signed: Andi HenceNicole L Stephens PA-C 05/14/2019, 8:01 AM

## 2019-05-14 NOTE — Progress Notes (Signed)
ANTICOAGULATION CONSULT NOTE - Initial Consult  Pharmacy Consult for heparin Indication: chest pain/ACS  No Known Allergies  Patient Measurements: Height: 5\' 8"  (172.7 cm) Weight: 145 lb (65.8 kg) IBW/kg (Calculated) : 68.4 kg Heparin Dosing Weight: 65.8 kg  Vital Signs: Temp: 97.3 F (36.3 C) (07/14 0224) Temp Source: Axillary (07/14 0224) BP: 138/91 (07/14 0408) Pulse Rate: 96 (07/14 0408)  Labs: Recent Labs    05/14/19 0113 05/14/19 0240  HGB 15.9  --   HCT 44.5  --   PLT 272  --   CREATININE 0.74  --   TROPONINIHS  --  338*    Estimated Creatinine Clearance: 89.1 mL/min (by C-G formula based on SCr of 0.74 mg/dL).   Medical History: Past Medical History:  Diagnosis Date  . CHF (congestive heart failure) (HCC)    EF: 35-40%, global hypokinesis, mod. mitral regurgitation (03/2019)  . COPD (chronic obstructive pulmonary disease) ([place])   . Diabetes mellitus, type 2 ([place])   . Drug abuse ([place])   . Dupuytren's contracture   . ETOH abuse 2018  . Hypertension   . Mixed hyperlipidemia 01/2018    Medications:  Scheduled:  . heparin  4,000 Units Intravenous Once    Assessment: Patient admitted x SOB was here prior in may for PNA. Initial trop was 337 ng/mL, EKG pending. Patient is not on anticoagulation PTA. Patient is being started on heparin drip for ACS.  Goal of Therapy:  Heparin level 0.3-0.7 units/ml Monitor platelets by anticoagulation protocol: Yes   Plan:  Will bolus w/ heparin 4000 units IV x 1 Will start rate at 1050 units/hr  Baseline CBC WNL, BL aPTT/INR pending. Will check anti-Xa @ 1000 Will monitor daily CBC's and adjust per anti-Xa levels.  Tobie Lords, PharmD, BCPS Clinical Pharmacist 05/14/2019,4:20 AM

## 2019-05-14 NOTE — Plan of Care (Signed)
Continue on Bipap

## 2019-05-14 NOTE — ED Provider Notes (Signed)
[facility] Emergency Department Provider Note    None    (approximate)  I have reviewed the triage vital signs and the nursing notes.   HISTORY  Chief Complaint Shortness of Breath  Level V Caveat:  Resp distress  HPI Brandon Bolton is a 63 y.o. male close past medical history most recent admission being in May presents the ER for respiratory distress.  Has been having exertional dyspnea over the past few days.  Patient called EMS due to severe shortness of breath not improving on nebulizer treatments.  Saw to be hypoxic to low 60s.  He was placed on CPAP given 2 albuterol and 1 DuoNeb as well as Solu-Medrol with improvement in symptoms.  Feels much more comfortable currently on CPAP.  Placed on BiPAP for continued respiratory support.    Past Medical History:  Diagnosis Date  . CHF (congestive heart failure) (HCC)    EF: 35-40%, global hypokinesis, mod. mitral regurgitation (03/2019)  . COPD (chronic obstructive pulmonary disease) (HCC)   . Diabetes mellitus, type 2 (HCC)   . Drug abuse (HCC)   . Dupuytren's contracture   . ETOH abuse 2018  . Hypertension   . Mixed hyperlipidemia 01/2018   Family History  Problem Relation Age of Onset  . Hypertension Father   . Heart disease Father   . Arthritis Father   . Cancer Father   . Allergic Disorder Mother    Past Surgical History:  Procedure Laterality Date  . HAND SURGERY     Patient Active Problem List   Diagnosis Date Noted  . Acute on chronic respiratory failure with hypoxemia (HCC) 05/14/2019  . Sepsis (HCC) 03/05/2019  . CAP (community acquired pneumonia) 03/05/2019  . COPD exacerbation (HCC) 03/05/2019  . Acute respiratory failure with hypoxia and hypercapnia (HCC) 03/05/2019  . Low hemoglobin 02/20/2018  . Edema extremities 10/05/2017  . Cellulitis and abscess of toe of right foot 11/24/2016  . Essential hypertension 10/04/2016  . Mixed hyperlipidemia 09/06/2016  . Chronic obstructive  pulmonary disease (HCC) 09/06/2016      Prior to Admission medications   Medication Sig Start Date End Date Taking? Authorizing Provider  amLODipine (NORVASC) 10 MG tablet Take 1 tablet (10 mg total) by mouth daily. 03/05/19   Doles-Johnson, Teah, NP  cetirizine (ZYRTEC) 10 MG tablet Take 1 tablet by mouth once daily 04/01/19   Doles-Johnson, Teah, NP  diphenhydrAMINE (BENADRYL) 25 mg capsule Take 25 mg by mouth every 6 (six) hours as needed for itching, allergies or sleep.     [provider]  meloxicam (MOBIC) 15 MG tablet Daily. Patient taking differently: Take 15 mg by mouth daily.  02/28/19   Virl Axehaplin, Don C, MD  neomycin-polymyxin-hydrocortisone (CORTISPORIN) OTIC solution Place 4 drops into the left ear 4 (four) times daily. Patient not taking: Reported on 03/05/2019 11/27/18   Iloabachie, Chioma E, NP  nicotine (NICODERM CQ - DOSED IN MG/24 HOURS) 21 mg/24hr patch Place 1 patch (21 mg total) onto the skin daily. 03/12/19   Gouru, Deanna ArtisAruna, MD  predniSONE (STERAPRED UNI-PAK 21 TAB) 10 MG (21) TBPK tablet Take 1 tablet (10 mg total) by mouth daily. Take 6 tablets by mouth for 1 day followed by  5 tablets by mouth for 1 day followed by  4 tablets by mouth for 1 day followed by  3 tablets by mouth for 1 day followed by  2 tablets by mouth for 1 day followed by  1 tablet by mouth for a  day and stop 03/11/19   Ramonita LabGouru, Aruna, MD  SYMBICORT 160-4.5 MCG/ACT inhaler Inhale 2 puffs into the lungs 2 (two) times daily. 03/05/19   Doles-Johnson, Teah, NP  VENTOLIN HFA 108 (90 Base) MCG/ACT inhaler INHALE 2 PUFFS BY MOUTH EVERY 6 HOURS AS NEEDED FOR WHEEZING OR SHORTNESS OF BREATH 03/11/19   Ramonita LabGouru, Aruna, MD    Allergies Patient has no known allergies.    Social History Social History   Tobacco Use  . Smoking status: Current Every Day Smoker    Packs/day: 1.00    Years: 40.00    Pack years: 40.00    Types: Cigarettes  . Smokeless tobacco: Never Used  Substance Use Topics  . Alcohol use: No     Frequency: Never    Comment: 2 quarts of beer/day  . Drug use: No    Types: Heroin    Comment: former heroin user (inj)    Review of Systems Patient denies headaches, rhinorrhea, blurry vision, numbness, shortness of breath, chest pain, edema, cough, abdominal pain, nausea, vomiting, diarrhea, dysuria, fevers, rashes or hallucinations unless otherwise stated above in HPI. ____________________________________________   PHYSICAL EXAM:  VITAL SIGNS: Vitals:   05/14/19 0224 05/14/19 0332  BP:  (!) 150/96  Pulse:  (!) 114  Resp:  (!) 26  Temp: (!) 97.3 F (36.3 C)   SpO2:  99%    Constitutional: Alert and oriented. In moderate respiratory distress Eyes: Conjunctivae are normal.  Head: Atraumatic. Nose: No congestion/rhinnorhea. Mouth/Throat: Mucous membranes are moist.   Neck: No stridor. Painless ROM.  Cardiovascular: tachycardic, regular rhythm. Grossly normal heart sounds.  Good peripheral circulation. Respiratory: tachypnea with diminished breath sounds throughout, faint wheeze Gastrointestinal: Soft and nontender. No distention. No abdominal bruits. No CVA tenderness. Genitourinary:  Musculoskeletal: No lower extremity tenderness nor edema.  No joint effusions. Neurologic:  Normal speech and language. No gross focal neurologic deficits are appreciated. No facial droop Skin:  Skin is warm, dry and intact. No rash noted. Psychiatric: Mood and affect are normal. Speech and behavior are normal.  ____________________________________________   LABS (all labs ordered are listed, but only abnormal results are displayed)  Results for orders placed or performed during the hospital encounter of 05/14/19 (from the past 24 hour(s))  Lactic acid, plasma     Status: Abnormal   Collection Time: 05/14/19  1:13 AM  Result Value Ref Range   Lactic Acid, Venous 2.3 (HH) 0.5 - 1.9 mmol/L  Comprehensive metabolic panel     Status: Abnormal   Collection Time: 05/14/19  1:13 AM  Result  Value Ref Range   Sodium 121 (L) 135 - 145 mmol/L   Potassium 4.5 3.5 - 5.1 mmol/L   Chloride 86 (L) 98 - 111 mmol/L   CO2 20 (L) 22 - 32 mmol/L   Glucose, Bld 124 (H) 70 - 99 mg/dL   BUN 18 8 - 23 mg/dL   Creatinine, Ser 4.090.74 0.61 - 1.24 mg/dL   Calcium 9.1 8.9 - 81.110.3 mg/dL   Total Protein 7.5 6.5 - 8.1 g/dL   Albumin 4.5 3.5 - 5.0 g/dL   AST 35 15 - 41 U/L   ALT 27 0 - 44 U/L   Alkaline Phosphatase 65 38 - 126 U/L   Total Bilirubin 1.1 0.3 - 1.2 mg/dL   GFR calc non Af Amer >60 >60 mL/min   GFR calc Af Amer >60 >60 mL/min   Anion gap 15 5 - 15  CBC WITH DIFFERENTIAL  Status: Abnormal   Collection Time: 05/14/19  1:13 AM  Result Value Ref Range   WBC 9.5 4.0 - 10.5 K/uL   RBC 4.73 4.22 - 5.81 MIL/uL   Hemoglobin 15.9 13.0 - 17.0 g/dL   HCT 44.5 39.0 - 52.0 %   MCV 94.1 80.0 - 100.0 fL   MCH 33.6 26.0 - 34.0 pg   MCHC 35.7 30.0 - 36.0 g/dL   RDW 13.2 11.5 - 15.5 %   Platelets 272 150 - 400 K/uL   nRBC 0.0 0.0 - 0.2 %   Neutrophils Relative % 83 %   Neutro Abs 7.8 (H) 1.7 - 7.7 K/uL   Lymphocytes Relative 9 %   Lymphs Abs 0.9 0.7 - 4.0 K/uL   Monocytes Relative 8 %   Monocytes Absolute 0.7 0.1 - 1.0 K/uL   Eosinophils Relative 0 %   Eosinophils Absolute 0.0 0.0 - 0.5 K/uL   Basophils Relative 0 %   Basophils Absolute 0.0 0.0 - 0.1 K/uL   Immature Granulocytes 0 %   Abs Immature Granulocytes 0.03 0.00 - 0.07 K/uL  Brain natriuretic peptide     Status: Abnormal   Collection Time: 05/14/19  1:13 AM  Result Value Ref Range   B Natriuretic Peptide 765.0 (H) 0.0 - 100.0 pg/mL  Procalcitonin     Status: None   Collection Time: 05/14/19  1:13 AM  Result Value Ref Range   Procalcitonin 0.19 ng/mL  SARS Coronavirus 2 (CEPHEID- Performed in [place] hospital lab), Hosp Order     Status: None   Collection Time: 05/14/19  1:13 AM   Specimen: Nasopharyngeal Swab  Result Value Ref Range   SARS Coronavirus 2 NEGATIVE NEGATIVE  Troponin I (High Sensitivity)     Status:  Abnormal   Collection Time: 05/14/19  2:40 AM  Result Value Ref Range   Troponin I (High Sensitivity) 338 (HH) <18 ng/L   ____________________________________________  EKG My review and personal interpretation at Time: 1:11   Indication: resp distress  Rate: 110  Rhythm: sinus Axis: normal Other: normal intervals, lbbb, nonspecific st abn, no stemi ____________________________________________  RADIOLOGY  I personally reviewed all radiographic images ordered to evaluate for the above acute complaints and reviewed radiology reports and findings.  These findings were personally discussed with the patient.  Please see medical record for radiology report.  ____________________________________________   PROCEDURES  Procedure(s) performed:  .Critical Care Performed by: Merlyn Lot, MD Authorized by: Merlyn Lot, MD   Critical care provider statement:    Critical care time (minutes):  40   Critical care time was exclusive of:  Separately billable procedures and treating other patients   Critical care was necessary to treat or prevent imminent or life-threatening deterioration of the following conditions:  Respiratory failure   Critical care was time spent personally by me on the following activities:  Development of treatment plan with patient or surrogate, discussions with consultants, evaluation of patient's response to treatment, examination of patient, obtaining history from patient or surrogate, ordering and performing treatments and interventions, ordering and review of laboratory studies, ordering and review of radiographic studies, pulse oximetry, re-evaluation of patient's condition and review of old charts      Critical Care performed: yes ____________________________________________   INITIAL IMPRESSION / [place] / ED COURSE  Pertinent labs & imaging results that were available during my care of the patient were reviewed by me and considered in my  medical decision making (see chart for details).  DDX: Asthma, copd, CHF, pna, ptx, malignancy, Pe, anemia   Brandon Ramaerry L Bristow is a 63 y.o. who presents to the ED with acute respiratory failure with hypoxia as described above requiring BiPAP.  Patient clinically is improving.  He denies any chest pain right now.  Seems more consistent with COPD emphysema.  Possible CHF.  Doubt PE.  Here to receive nebulizer as well as Solu-Medrol.  Will give broad-spectrum antibiotics due to concern for possible pneumonia.  The patient will be placed on continuous pulse oximetry and telemetry for monitoring.  Laboratory evaluation will be sent to evaluate for the above complaints.     Clinical Course as of May 13 346  Tue May 14, 2019  0233 Patient reassessed.  Appears much more comfortable.  Denies any chest pain.  Doubt ACS but will have previous EKG to compare to.  Lateral and troponin.  Seems primarily respiratory presentation with probable severe emphysema.  Also noted to have acute hyponatremia.  Will give gentle IV hydration.   [PR]  0346 Patient's troponin is elevated but this is in the setting of acute respiratory failure with hypoxia which I do suspect is resulting in demand ischemia he denies any chest pain at this time.  Patient receiving nitro.  Patient will be admitted to ICU for further medical management.   [PR]    Clinical Course User Index [PR] Willy Eddyobinson, Wilmar Prabhakar, MD    The patient was evaluated in Emergency Department today for the symptoms described in the history of present illness. He/she was evaluated in the context of the global COVID-19 pandemic, which necessitated consideration that the patient might be at risk for infection with the SARS-CoV-2 virus that causes COVID-19. Institutional protocols and algorithms that pertain to the evaluation of patients at risk for COVID-19 are in a state of rapid change based on information released by regulatory bodies including the CDC and federal and state  organizations. These policies and algorithms were followed during the patient's care in the ED.  As part of my medical decision making, I reviewed the following data within the electronic MEDICAL RECORD NUMBER Nursing notes reviewed and incorporated, Labs reviewed, notes from prior ED visits and [place] Controlled Substance Database   ____________________________________________   FINAL CLINICAL IMPRESSION(S) / ED DIAGNOSES  Final diagnoses:  Acute respiratory failure with hypoxia (HCC)  Hyponatremia      NEW MEDICATIONS STARTED DURING THIS VISIT:  New Prescriptions   No medications on file     Note:  This document was prepared using Dragon voice recognition software and may include unintentional dictation errors.    Willy Eddyobinson, Mykala Mccready, MD 05/14/19 [phone]

## 2019-05-14 NOTE — Progress Notes (Addendum)
ANTICOAGULATION CONSULT NOTE  Pharmacy Consult for heparin Indication: chest pain/ACS  Patient Measurements: Height: 5\' 8"  (172.7 cm) Weight: 145 lb (65.8 kg) IBW/kg (Calculated) : 68.4 kg Heparin Dosing Weight: 65.8 kg  Vital Signs: Temp: 97.6 F (36.4 C) (07/14 1200) Temp Source: Oral (07/14 1200) BP: 117/68 (07/14 1800) Pulse Rate: 99 (07/14 1800)  Labs: Recent Labs    05/14/19 0113 05/14/19 0240 05/14/19 0527 05/14/19 0743 05/14/19 1021 05/14/19 1951  HGB 15.9  --   --   --   --   --   HCT 44.5  --   --   --   --   --   PLT 272  --   --   --   --   --   APTT  --   --  >160*  --   --   --   LABPROT  --   --  13.5  --   --   --   INR  --   --  1.0  --   --   --   HEPARINUNFRC  --   --   --   --  0.30 0.23*  CREATININE 0.74  --  0.82  --   --   --   TROPONINIHS  --  338* 317* 309*  --   --     Estimated Creatinine Clearance: 86.9 mL/min (by C-G formula based on SCr of 0.82 mg/dL).   Medical History: Past Medical History:  Diagnosis Date  . CHF (congestive heart failure) (HCC)    EF: 35-40%, global hypokinesis, mod. mitral regurgitation (03/2019)  . COPD (chronic obstructive pulmonary disease) ([place])   . Diabetes mellitus, type 2 ([place])   . Drug abuse ([place])   . Dupuytren's contracture   . ETOH abuse 2018  . Hypertension   . Mixed hyperlipidemia 01/2018    Medications:  Scheduled:  . albuterol  2.5 mg Nebulization Q4H  . budesonide (PULMICORT) nebulizer solution  0.5 mg Nebulization BID  . chlorhexidine  15 mL Mouth Rinse BID  . Chlorhexidine Gluconate Cloth  6 each Topical Q0600  . docusate sodium  100 mg Oral BID  . famotidine  20 mg Oral BID  . folic acid  1 mg Oral Daily  . insulin aspart  0-5 Units Subcutaneous QHS  . insulin aspart  0-9 Units Subcutaneous TID WC  . mouth rinse  15 mL Mouth Rinse q12n4p  . methylPREDNISolone (SOLU-MEDROL) injection  40 mg Intravenous Q12H  . multivitamin with minerals  1 tablet Oral Daily  . nicotine  21 mg  Transdermal Daily  . thiamine  100 mg Oral Daily   Or  . thiamine  100 mg Intravenous Daily    Assessment: Patient admitted x SOB was here prior in may for PNA. Initial trop was 337 ng/mL,  Patient is not on anticoagulation PTA. Patient is being started on heparin drip for ACS.  Goal of Therapy:  Heparin level 0.3-0.7 units/ml Monitor platelets by anticoagulation protocol: Yes  Heparin Course: 7/14 am initiation: bolus 4000 units IV x 1, then 1050 units/hr  7/14 1021 HL 0.30 7/14 1951 HL 0.23   Plan:   Heparin level subtherapeutic. Spoke with RN, no report of heparin drip having been turned off or held  Bolus 900 units x 1 and increase rate to 1250 units/hr  check anti-Xa 7/15 at 0300  Will monitor daily CBC  Dorena Bodo, PharmD Clinical Pharmacist 05/14/2019,8:49 PM

## 2019-05-14 NOTE — Progress Notes (Signed)
Critical Lab; lactic acid=2.3; PTT>160 reported to Hinton Dyer, NP.

## 2019-05-15 ENCOUNTER — Inpatient Hospital Stay: Payer: Medicaid Other

## 2019-05-15 DIAGNOSIS — I5021 Acute systolic (congestive) heart failure: Secondary | ICD-10-CM

## 2019-05-15 LAB — BASIC METABOLIC PANEL
Anion gap: 11 (ref 5–15)
BUN: 18 mg/dL (ref 8–23)
CO2: 24 mmol/L (ref 22–32)
Calcium: 8.2 mg/dL — ABNORMAL LOW (ref 8.9–10.3)
Chloride: 91 mmol/L — ABNORMAL LOW (ref 98–111)
Creatinine, Ser: 0.68 mg/dL (ref 0.61–1.24)
GFR calc Af Amer: >60 mL/min (ref >60)
GFR calc non Af Amer: >60 mL/min (ref >60)
Glucose, Bld: 169 mg/dL — ABNORMAL HIGH (ref 70–99)
Potassium: 3.6 mmol/L (ref 3.5–5.1)
Sodium: 126 mmol/L — ABNORMAL LOW (ref 135–145)

## 2019-05-15 LAB — HEPARIN LEVEL (UNFRACTIONATED)
Heparin Unfractionated: 0.36 IU/mL (ref 0.30–0.70)
Heparin Unfractionated: 0.53 IU/mL (ref 0.30–0.70)

## 2019-05-15 LAB — CBC
HCT: 37.5 % — ABNORMAL LOW (ref 39.0–52.0)
Hemoglobin: 13.3 g/dL (ref 13.0–17.0)
MCH: 33.7 pg (ref 26.0–34.0)
MCHC: 35.5 g/dL (ref 30.0–36.0)
MCV: 94.9 fL (ref 80.0–100.0)
Platelets: 206 10*3/uL (ref 150–400)
RBC: 3.95 MIL/uL — ABNORMAL LOW (ref 4.22–5.81)
RDW: 13.3 % (ref 11.5–15.5)
WBC: 8.7 10*3/uL (ref 4.0–10.5)
nRBC: 0 % (ref 0.0–0.2)

## 2019-05-15 LAB — GLUCOSE, CAPILLARY
Glucose-Capillary: 163 mg/dL — ABNORMAL HIGH (ref 70–99)
Glucose-Capillary: 198 mg/dL — ABNORMAL HIGH (ref 70–99)
Glucose-Capillary: 139 mg/dL — ABNORMAL HIGH (ref 70–99)
Glucose-Capillary: 193 mg/dL — ABNORMAL HIGH (ref 70–99)

## 2019-05-15 LAB — HEMOGLOBIN A1C
Hgb A1c MFr Bld: 5.3 % (ref 4.8–5.6)
Mean Plasma Glucose: 105.41 mg/dL

## 2019-05-15 LAB — MAGNESIUM: Magnesium: 2 mg/dL (ref 1.7–2.4)

## 2019-05-15 MED ORDER — POTASSIUM CHLORIDE 20 MEQ PO PACK
40.0000 meq | PACK | Freq: Once | ORAL | Status: AC
  Administered 2019-05-15: 17:00:00 40 meq via ORAL
  Filled 2019-05-15: qty 2

## 2019-05-15 MED ORDER — LORAZEPAM 2 MG/ML IJ SOLN: 1.0000 mg | Freq: Four times a day (QID) | INTRAMUSCULAR | Status: AC | PRN

## 2019-05-15 MED ORDER — FUROSEMIDE 10 MG/ML IJ SOLN
20.0000 mg | Freq: Two times a day (BID) | INTRAMUSCULAR | Status: DC
  Administered 2019-05-15 – 2019-05-17 (×5): 20 mg via INTRAVENOUS
  Filled 2019-05-15 (×7): qty 2

## 2019-05-15 MED ORDER — CETIRIZINE HCL 10 MG PO TABS
10.0000 mg | ORAL_TABLET | Freq: Every day | ORAL | Status: DC
  Administered 2019-05-15 – 2019-05-17 (×3): 10 mg via ORAL
  Filled 2019-05-15 (×3): qty 1

## 2019-05-15 MED ORDER — ALPRAZOLAM 0.25 MG PO TABS
0.2500 mg | ORAL_TABLET | Freq: Three times a day (TID) | ORAL | Status: DC | PRN
  Administered 2019-05-15 – 2019-05-17 (×5): 0.25 mg via ORAL
  Filled 2019-05-15 (×5): qty 1

## 2019-05-15 MED ORDER — LORAZEPAM 1 MG PO TABS
1.0000 mg | ORAL_TABLET | Freq: Four times a day (QID) | ORAL | Status: AC | PRN
  Administered 2019-05-15 – 2019-05-16 (×2): 1 mg via ORAL
  Filled 2019-05-15 (×2): qty 1

## 2019-05-15 MED ORDER — FUROSEMIDE 10 MG/ML IJ SOLN
60.0000 mg | Freq: Once | INTRAMUSCULAR | Status: AC
  Administered 2019-05-15: 60 mg via INTRAVENOUS
  Filled 2019-05-15: qty 6

## 2019-05-15 MED ORDER — PHENOL 1.4 % MT LIQD
2.0000 | OROMUCOSAL | Status: DC | PRN
  Administered 2019-05-15: 2 via OROMUCOSAL
  Filled 2019-05-15 (×2): qty 177

## 2019-05-15 NOTE — Progress Notes (Signed)
Patient Name: Brandon Bolton Date of Encounter: 05/15/2019  Hospital Problem List     Active Problems:   Acute on chronic respiratory failure with hypoxemia [facility](HCC)    Patient Profile     Patient admitted with respiratory failure. Moderate troponin elevation felt secondary to demand ischemia due to respiratory failure.  Has a history of heavy ethanol abuse.  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  Still short of breath and mildly tachycardic but improved.  Subjective   Feels somewhat better.  No chest pain.  Inpatient Medications    . albuterol  2.5 mg Nebulization Q4H  . budesonide (PULMICORT) nebulizer solution  0.5 mg Nebulization BID  . cetirizine  10 mg Oral Daily  . chlorhexidine  15 mL Mouth Rinse BID  . Chlorhexidine Gluconate Cloth  6 each Topical Q0600  . docusate sodium  100 mg Oral BID  . famotidine  20 mg Oral BID  . folic acid  1 mg Oral Daily  . furosemide  20 mg Intravenous Q12H  . insulin aspart  0-5 Units Subcutaneous QHS  . insulin aspart  0-9 Units Subcutaneous TID WC  . mouth rinse  15 mL Mouth Rinse q12n4p  . methylPREDNISolone (SOLU-MEDROL) injection  40 mg Intravenous Q12H  . multivitamin with minerals  1 tablet Oral Daily  . nicotine  21 mg Transdermal Daily  . thiamine  100 mg Oral Daily   Or  . thiamine  100 mg Intravenous Daily    Vital Signs    Vitals:   05/15/19 1500 05/15/19 1600 05/15/19 1700 05/15/19 1800  BP: (!) 132/92 109/72 131/85 124/85  Pulse: (!) 103 86 100 96  Resp: 14 13 15 12   Temp:  98 F (36.7 C)    TempSrc:  Oral    SpO2: 96% 94% 99% 97%  Weight:      Height:        Intake/Output Summary (Last 24 hours) at 05/15/2019 1942 Last data filed at 05/15/2019 1817 Gross per 24 hour  Intake 1360.95 ml  Output 2300 ml  Net -939.05 ml   Filed Weights   05/14/19 0110 05/15/19 0500  Weight: 65.8 kg 70 kg    Physical Exam    GEN: Well nourished, well developed, in no acute  distress.  HEENT: normal.  Neck: Supple, no JVD, carotid bruits, or masses. Cardiac: RRR, no murmurs, rubs, or gallops. No clubbing, cyanosis, edema.  Radials/DP/PT 2+ and equal bilaterally.  Respiratory:  Respirations regular and unlabored, clear to auscultation bilaterally. GI: Soft, nontender, nondistended, BS + x 4. MS: no deformity or atrophy. Skin: warm and dry, no rash. Neuro:  Strength and sensation are intact. Psych: Normal affect.  Labs    CBC Recent Labs    05/14/19 0113 05/15/19 0255  WBC 9.5 8.7  NEUTROABS 7.8*  --   HGB 15.9 13.3  HCT 44.5 37.5*  MCV 94.1 94.9  PLT 272 206   Basic Metabolic Panel Recent Labs    60/63/0107/14/20 0527 05/15/19 0255  NA 123* 126*  K 5.0 3.6  CL 90* 91*  CO2 22 24  GLUCOSE 146* 169*  BUN 19 18  CREATININE 0.82 0.68  CALCIUM 8.5* 8.2*  MG 2.0 2.0  PHOS 4.5  --    Liver Function Tests Recent Labs    05/14/19 0113  AST 35  ALT 27  ALKPHOS 65  BILITOT 1.1  PROT 7.5  ALBUMIN 4.5   No results for input(s): LIPASE,  AMYLASE in the last 72 hours. Cardiac Enzymes No results for input(s): CKTOTAL, CKMB, CKMBINDEX, TROPONINI in the last 72 hours. BNP Recent Labs    05/14/19 0113  BNP 765.0*   D-Dimer No results for input(s): DDIMER in the last 72 hours. Hemoglobin A1C Recent Labs    05/15/19 0255  HGBA1C 5.3   Fasting Lipid Panel No results for input(s): CHOL, HDL, LDLCALC, TRIG, CHOLHDL, LDLDIRECT in the last 72 hours. Thyroid Function Tests Recent Labs    05/14/19 0527  TSH 2.533    Telemetry    Sinus tachycardia  ECG    Sinus tachycardia with no ischemia  Radiology    Dg Chest Port 1 View  Result Date: 05/15/2019 CLINICAL DATA:  Shortness of breath EXAM: PORTABLE CHEST 1 VIEW COMPARISON:  05/14/2019 FINDINGS: Cardiac shadow is enlarged but stable. Vascular congestion is noted with some underlying interstitial edema. The overall appearance is slightly more prominent than that seen on the prior exam. No  bony abnormality is noted. No focal infiltrate is seen. IMPRESSION: Changes of congestive failure. Electronically Signed   By: Inez Catalina M.D.   On: 05/15/2019 08:06   Dg Chest Portable 1 View  Result Date: 05/14/2019 CLINICAL DATA:  Respiratory distress EXAM: PORTABLE CHEST 1 VIEW COMPARISON:  03/05/2019 FINDINGS: Borderline to mild cardiomegaly. No focal consolidation. Diffuse bilateral streaky and interstitial pulmonary opacity which may be secondary to acute interstitial inflammatory process on underlying chronic lung disease. Consider also viral or atypical pneumonia. No pneumothorax. IMPRESSION: Diffuse bilateral interstitial and streaky lung opacity which may be due to acute interstitial inflammatory process on underlying chronic change. Differential also includes viral or atypical pneumonia. Electronically Signed   By: Donavan Foil M.D.   On: 05/14/2019 01:23    Assessment & Plan    Cardiomyopathy-appears to be alcohol induced.  Does not appear to be due to ischemia as he has global hypokinesis.  No regional wall motion abnormality.  Elevated troponin appears to be supply demand.  Not secondary to ACS.  We will continue to aggressively treat his respiratory failure and volume overload.  Refrain from ethanol.  Does not appear to require cardiac catheterization at present.  Will discontinue heparin morning.  Signed, Javier Docker Chalyn Amescua MD 05/15/2019, 7:42 PM  Pager: [phone]

## 2019-05-15 NOTE — Progress Notes (Signed)
Name: Brandon Bolton MRN: [id number] DOB: [birth year]     HISTORY OF PRESENT ILLNESS:  63 y.o male with a history of CHF, COPD, HTN, HLD, and T2DM admitted for SOB x 3-4 days. He was tx for pneumonia in May 2020. He required BiPAP upon admission but has since been on 3L [place] maintaining adequate oxygen saturation >90%. Possible new LBBB as previous EKG not available for review accompanied with elevated Troponins concern for NSTEMI. Last admission Echo showed systolic CHF with an EF of 35%. Repeat Echo yesterday demonstrated a EF of 30%. Patient reports sleeping well throughout the evening. He denies any panic attacks since admission. He states when just sitting and watching TV he does not feel short of breath. Increased SOB when leaning forward to eat breakfast. He reports passing flatus but no bowel movement since admission. He denies any chest pain, tightness, hemoptysis, abdominal pain.  PAST MEDICAL HISTORY :   has a past medical history of CHF (congestive heart failure) (HCC), COPD (chronic obstructive pulmonary disease) (HCC), Diabetes mellitus, type 2 (HCC), Drug abuse (HCC), Dupuytren's contracture, ETOH abuse (2018), Hypertension, and Mixed hyperlipidemia (01/2018).  has a past surgical history that includes Hand surgery.  REVIEW OF SYSTEMS:    Gen:  Denies  fever, sweats, chills HEENT: Denies blurred vision, double vision, ear pain, eye pain, hearing loss, nose bleeds, sore throat Cardiac:  No dizziness, chest pain or heaviness, chest tightness, edema Resp:  +cough, +sputum production, +shortness of breath,+wheezing, -hemoptysis,  Gi: Denies swallowing difficulty, stomach pain, nausea or vomiting, diarrhea, constipation, bowel incontinence Gu:  Denies bladder incontinence, burning urine Ext:   Denies Joint pain, stiffness or swelling Skin: Denies  skin rash, easy bruising or bleeding or hives Endoc:  Denies polyuria, polydipsia , polyphagia or weight change Psych:   Denies depression,  insomnia or hallucinations  Other:  All other systems negative   VITAL SIGNS: Temp:  [97.6 F (36.4 C)-98.1 F (36.7 C)] 97.9 F (36.6 C) (07/15 0200) Pulse Rate:  [86-113] 93 (07/15 0500) Resp:  [9-30] 14 (07/15 0500) BP: (109-146)/(68-92) 113/78 (07/15 0500) SpO2:  [93 %-98 %] 95 % (07/15 0744) Weight:  [70 kg] 70 kg (07/15 0500)   I/O last 3 completed shifts: In: 1961.9 [P.O.:480; I.V.:281.8; IV Piggyback:1200.1] Out: 3950 [Urine:3950] No intake/output data recorded.   SpO2: 95 % O2 Flow Rate (L/min): 3 L/min FiO2 (%): 36 %   Physical Examination:  GENERAL:critically ill appearing, +resp distress HEAD: Normocephalic, atraumatic.  EYES: Pupils equal, round, reactive to light.  No scleral icterus.  MOUTH: Moist mucosal membrane. NECK: Supple. No JVD.  PULMONARY:  +wheezing CARDIOVASCULAR: Distant heart sounds  GASTROINTESTINAL: Soft, nontender, -distended. No masses. Positive bowel sounds. No hepatosplenomegaly.  MUSCULOSKELETAL: No swelling, clubbing, + ankle edema.  NEUROLOGIC: Alert and oriented x 3, able to follow simple commands SKIN:intact,warm,dry   CULTURE RESULTS   Recent Results (from the past 240 hour(s))  SARS Coronavirus 2 (CEPHEID- Performed in [facility]), Hosp Order     Status: None   Collection Time: 05/14/19  1:13 AM   Specimen: Nasopharyngeal Swab  Result Value Ref Range Status   SARS Coronavirus 2 NEGATIVE NEGATIVE Final    Comment: (NOTE) If result is NEGATIVE SARS-CoV-2 target nucleic acids are NOT DETECTED. The SARS-CoV-2 RNA is generally detectable in upper and lower  respiratory specimens during the acute phase of infection. The lowest  concentration of SARS-CoV-2 viral copies this assay can detect is 250  copies / mL. A  negative result does not preclude SARS-CoV-2 infection  and should not be used as the sole basis for treatment or other  patient management decisions.  A negative result may occur with  improper  specimen collection / handling, submission of specimen other  than nasopharyngeal swab, presence of viral mutation(s) within the  areas targeted by this assay, and inadequate number of viral copies  (<250 copies / mL). A negative result must be combined with clinical  observations, patient history, and epidemiological information. If result is POSITIVE SARS-CoV-2 target nucleic acids are DETECTED. The SARS-CoV-2 RNA is generally detectable in upper and lower  respiratory specimens dur ing the acute phase of infection.  Positive  results are indicative of active infection with SARS-CoV-2.  Clinical  correlation with patient history and other diagnostic information is  necessary to determine patient infection status.  Positive results do  not rule out bacterial infection or co-infection with other viruses. If result is PRESUMPTIVE POSTIVE SARS-CoV-2 nucleic acids MAY BE PRESENT.   A presumptive positive result was obtained on the submitted specimen  and confirmed on repeat testing.  While 2019 novel coronavirus  (SARS-CoV-2) nucleic acids may be present in the submitted sample  additional confirmatory testing may be necessary for epidemiological  and / or clinical management purposes  to differentiate between  SARS-CoV-2 and other Sarbecovirus currently known to infect humans.  If clinically indicated additional testing with an alternate test  methodology [phone]([id number]) is advised. The SARS-CoV-2 RNA is generally  detectable in upper and lower respiratory sp ecimens during the acute  phase of infection. The expected result is Negative. Fact Sheet for Patients:  BoilerBrush.com.cyhttps://www.fda.gov/media/136312/download Fact Sheet for Healthcare Providers: https://pope.com/https://www.fda.gov/media/136313/download This test is not yet approved or cleared by the [place] FDA and has been authorized for detection and/or diagnosis of SARS-CoV-2 by FDA under an Emergency Use Authorization (EUA).  This EUA will remain in  effect (meaning this test can be used) for the duration of the COVID-19 declaration under Section 564(b)(1) of the Act, 21 U.S.C. section 360bbb-3(b)(1), unless the authorization is terminated or revoked sooner. Performed at [facility], [address] [place], [place] [address]   Blood Culture (routine x 2)     Status: None (Preliminary result)   Collection Time: 05/14/19  1:14 AM   Specimen: BLOOD  Result Value Ref Range Status   Specimen Description BLOOD RIGHT ASSIST CONTROL  Final   Special Requests   Final    BOTTLES DRAWN AEROBIC AND ANAEROBIC Blood Culture adequate volume   Culture   Final    NO GROWTH < 12 HOURS Performed at [facility], [address] [place], [place] [address]    Report Status PENDING  Incomplete  MRSA PCR Screening     Status: None   Collection Time: 05/14/19  4:41 AM   Specimen: Nasal Mucosa; Nasopharyngeal  Result Value Ref Range Status   MRSA by PCR NEGATIVE NEGATIVE Final    Comment:        The GeneXpert MRSA Assay (FDA approved for NASAL specimens only), is one component of a comprehensive MRSA colonization surveillance program. It is not intended to diagnose MRSA infection nor to guide or monitor treatment for MRSA infections. Performed at [facility], [address] [place], [place] [address]   Blood Culture (routine x 2)     Status: None (Preliminary result)   Collection Time: 05/14/19  5:26 AM   Specimen: BLOOD  Result Value Ref Range Status   Specimen  Description BLOOD LEFT WRIST  Final   Special Requests   Final    BOTTLES DRAWN AEROBIC AND ANAEROBIC Blood Culture adequate volume   Culture   Final    NO GROWTH < 12 HOURS Performed at [facility], [address] [place], [place] [address]    Report Status PENDING  Incomplete        IMAGING    Dg Chest Port 1 View  Result Date: 05/15/2019 CLINICAL DATA:  Shortness of breath EXAM: PORTABLE CHEST 1 VIEW COMPARISON:  05/14/2019  FINDINGS: Cardiac shadow is enlarged but stable. Vascular congestion is noted with some underlying interstitial edema. The overall appearance is slightly more prominent than that seen on the prior exam. No bony abnormality is noted. No focal infiltrate is seen. IMPRESSION: Changes of congestive failure. Electronically Signed   By: Inez Catalina M.D.   On: 05/15/2019 08:06   Slight worsening of opacities/edema  Indwelling Urinary Catheter continued, requirement due to   Reason to continue Indwelling Urinary Catheter strict Intake/Output monitoring for hemodynamic instability    ASSESSMENT AND PLAN SYNOPSIS  63 y.o. white male admitted for acute hypoxic/hypercampnic respiratory failure with pulmonary edema likely from acute systolic heart failure with probable ACS with NSTEMI contributing to COPD exacerbation in the setting of active smoking and alcohol abuse.   Severe ACUTE Hypoxic and Hypercapnic Respiratory Failure-placed back on BiPAP this AM -BiPAP PRN as tolerated  -continue Bronchodilator Therapy -Wean Fio2 as tolerated  ACUTE SYSTOLIC CARDIAC FAILURE- EF (30%) -oxygen as needed -Lasix as tolerated -follow up cardiac enzymes as indicated -follow up cardiology recs  SEVERE COPD EXACERBATION -continue IV steroids as prescribed -continue NEB THERAPY as prescribed -morphine as needed -wean fio2 as needed and tolerated  CARDIAC ICU monitoring  ID -follow up cultures  GI GI PROPHYLAXIS as indicated  NUTRITIONAL STATUS DIET-->NPO while on biPAP Constipation protocol as indicated   ENDO - will use ICU hypoglycemic\Hyperglycemia protocol if needed   ELECTROLYTES -follow labs as needed -replace as needed -pharmacy consultation and following   DVT/GI PRX ordered TRANSFUSIONS AS NEEDED MONITOR FSBS ASSESS the need for LABS      Critical Care Time devoted to patient care services described in this note is 32 minutes.   Overall, patient is critically ill,  prognosis is guarded.     Corrin Parker, M.D.  [facility]  Medical Director [place] Director [facility] Cardio-Pulmonary Department

## 2019-05-15 NOTE — Progress Notes (Signed)
ANTICOAGULATION CONSULT NOTE  Pharmacy Consult for heparin Indication: chest pain/ACS  Patient Measurements: Height: 5\' 8"  (172.7 cm) Weight: 154 lb 5.2 oz (70 kg) IBW/kg (Calculated) : 68.4 kg Heparin Dosing Weight: 65.8 kg  Vital Signs: Temp: 97.9 F (36.6 C) (07/15 0200) Temp Source: Oral (07/15 0200) BP: 113/78 (07/15 0500) Pulse Rate: 93 (07/15 0500)  Labs: Recent Labs    05/14/19 0113 05/14/19 0240 05/14/19 0527 05/14/19 0743 05/14/19 1021 05/14/19 1951 05/15/19 0255  HGB 15.9  --   --   --   --   --  13.3  HCT 44.5  --   --   --   --   --  37.5*  PLT 272  --   --   --   --   --  206  APTT  --   --  >160*  --   --   --   --   LABPROT  --   --  13.5  --   --   --   --   INR  --   --  1.0  --   --   --   --   HEPARINUNFRC  --   --   --   --  0.30 0.23* 0.36  CREATININE 0.74  --  0.82  --   --   --  0.68  TROPONINIHS  --  338* 317* 309*  --   --   --     Estimated Creatinine Clearance: 92.6 mL/min (by C-G formula based on SCr of 0.68 mg/dL).   Medical History: Past Medical History:  Diagnosis Date  . CHF (congestive heart failure) (HCC)    EF: 35-40%, global hypokinesis, mod. mitral regurgitation (03/2019)  . COPD (chronic obstructive pulmonary disease) (HCC)   . Diabetes mellitus, type 2 (HCC)   . Drug abuse (HCC)   . Dupuytren's contracture   . ETOH abuse 2018  . Hypertension   . Mixed hyperlipidemia 01/2018    Medications:  Scheduled:  . albuterol  2.5 mg Nebulization Q4H  . budesonide (PULMICORT) nebulizer solution  0.5 mg Nebulization BID  . chlorhexidine  15 mL Mouth Rinse BID  . Chlorhexidine Gluconate Cloth  6 each Topical Q0600  . docusate sodium  100 mg Oral BID  . famotidine  20 mg Oral BID  . folic acid  1 mg Oral Daily  . insulin aspart  0-5 Units Subcutaneous QHS  . insulin aspart  0-9 Units Subcutaneous TID WC  . mouth rinse  15 mL Mouth Rinse q12n4p  . methylPREDNISolone (SOLU-MEDROL) injection  40 mg Intravenous Q12H  .  multivitamin with minerals  1 tablet Oral Daily  . nicotine  21 mg Transdermal Daily  . thiamine  100 mg Oral Daily   Or  . thiamine  100 mg Intravenous Daily    Assessment: Patient admitted x SOB was here prior in may for PNA. Initial trop was 337 ng/mL,  Patient is not on anticoagulation PTA. Patient is being started on heparin drip for ACS.  Goal of Therapy:  Heparin level 0.3-0.7 units/ml Monitor platelets by anticoagulation protocol: Yes  Heparin Course: 7/14 am initiation: bolus 4000 units IV x 1, then 1050 units/hr  7/14 1021 HL 0.30: rate inc to 1150 units/hr 7/14 1951 HL 0.23: 900 unit bolus, then inc to 1250 units/hr 7/15 0300 HL 0.36: no change 7/15 0848 HL 0.53: no change   Plan:   Heparin level is therapeutic x 2  Continue heparin  infusion at 1250 units/hr  H&H, PLT still wnl: f/u CBC in am  check anti-Xa with am labs   Will monitor daily CBC's and adjust per anti-Xa levels.  Vallery Sa, PharmD Clinical Pharmacist 05/15/2019,7:08 AM

## 2019-05-15 NOTE — Progress Notes (Signed)
[employer] - [place] at [facility]   PATIENT NAME: Brandon Bolton    MR#:  [id number]  DATE OF BIRTH:  1956/08/13  SUBJECTIVE:  CHIEF COMPLAINT:   Chief Complaint  Patient presents with  . Shortness of Breath   Came with respi distress, on bipap, CHF and copd exacerbation.  REVIEW OF SYSTEMS:  CONSTITUTIONAL: No fever, fatigue or weakness.  EYES: No blurred or double vision.  EARS, NOSE, AND THROAT: No tinnitus or ear pain.  RESPIRATORY: have cough, shortness of breath, wheezing ,no hemoptysis.  CARDIOVASCULAR: No chest pain, orthopnea, edema.  GASTROINTESTINAL: No nausea, vomiting, diarrhea or abdominal pain.  GENITOURINARY: No dysuria, hematuria.  ENDOCRINE: No polyuria, nocturia,  HEMATOLOGY: No anemia, easy bruising or bleeding SKIN: No rash or lesion. MUSCULOSKELETAL: No joint pain or arthritis.   NEUROLOGIC: No tingling, numbness, weakness.  PSYCHIATRY: No anxiety or depression.   ROS  DRUG ALLERGIES:  No Known Allergies  VITALS:  Blood pressure (!) 132/92, pulse (!) 103, temperature 97.9 F (36.6 C), temperature source Oral, resp. rate 14, height 5\' 8"  (1.727 m), weight 70 kg, SpO2 96 %.  PHYSICAL EXAMINATION:  GENERAL:  63 y.o.-year-old patient lying in the bed with no acute distress.  EYES: Pupils equal, round, reactive to light and accommodation. No scleral icterus. Extraocular muscles intact.  HEENT: Head atraumatic, normocephalic. Oropharynx and nasopharynx clear.  NECK:  Supple, no jugular venous distention. No thyroid enlargement, no tenderness.  LUNGS: Normal breath sounds bilaterally,have wheezing, some crepitation. Positive for use of accessory muscles of respiration. Bipap in use. CARDIOVASCULAR: S1, S2 normal. No murmurs, rubs, or gallops.  ABDOMEN: Soft, nontender, nondistended. Bowel sounds present. No organomegaly or mass.  EXTREMITIES: No pedal edema, cyanosis, or clubbing.  NEUROLOGIC: Cranial nerves II through XII are intact. Muscle  strength 4/5 in all extremities. Sensation intact. Gait not checked.  PSYCHIATRIC: The patient is alert and oriented x 3.  SKIN: No obvious rash, lesion, or ulcer.   Physical Exam LABORATORY PANEL:   CBC Recent Labs  Lab 05/15/19 0255  WBC 8.7  HGB 13.3  HCT 37.5*  PLT 206   ------------------------------------------------------------------------------------------------------------------  Chemistries  Recent Labs  Lab 05/14/19 0113  05/15/19 0255  NA 121*   < > 126*  K 4.5   < > 3.6  CL 86*   < > 91*  CO2 20*   < > 24  GLUCOSE 124*   < > 169*  BUN 18   < > 18  CREATININE 0.74   < > 0.68  CALCIUM 9.1   < > 8.2*  MG  --    < > 2.0  AST 35  --   --   ALT 27  --   --   ALKPHOS 65  --   --   BILITOT 1.1  --   --    < > = values in this interval not displayed.   ------------------------------------------------------------------------------------------------------------------  Cardiac Enzymes No results for input(s): TROPONINI in the last 168 hours. ------------------------------------------------------------------------------------------------------------------  RADIOLOGY:  Dg Chest Port 1 View  Result Date: 05/15/2019 CLINICAL DATA:  Shortness of breath EXAM: PORTABLE CHEST 1 VIEW COMPARISON:  05/14/2019 FINDINGS: Cardiac shadow is enlarged but stable. Vascular congestion is noted with some underlying interstitial edema. The overall appearance is slightly more prominent than that seen on the prior exam. No bony abnormality is noted. No focal infiltrate is seen. IMPRESSION: Changes of congestive failure. Electronically Signed   By: Alcide CleverMark  Lukens M.D.   On:  05/15/2019 08:06   Dg Chest Portable 1 View  Result Date: 05/14/2019 CLINICAL DATA:  Respiratory distress EXAM: PORTABLE CHEST 1 VIEW COMPARISON:  03/05/2019 FINDINGS: Borderline to mild cardiomegaly. No focal consolidation. Diffuse bilateral streaky and interstitial pulmonary opacity which may be secondary to acute  interstitial inflammatory process on underlying chronic lung disease. Consider also viral or atypical pneumonia. No pneumothorax. IMPRESSION: Diffuse bilateral interstitial and streaky lung opacity which may be due to acute interstitial inflammatory process on underlying chronic change. Differential also includes viral or atypical pneumonia. Electronically Signed   By: Donavan Foil M.D.   On: 05/14/2019 01:23    ASSESSMENT AND PLAN:   Active Problems:   Acute on chronic respiratory failure with hypoxemia (HCC)  1.  Respiratory failure: Acute on chronic; with hypoxemia.   on BiPAP.  COVID negative.  No discrete infiltrate seen on x-[place] but the patient does have diffuse interstitial coarsening.  Continue scheduled duo nebs treatments.  The patient has received broad-spectrum antibiotics but procalcitonin is negative.  Does not consistently meet criteria for sepsis. ABX stopped by ICU. 2.  COPD: With exacerbation; continue steroids as well as inhaled corticosteroid.  3.  CHF: Systolic; acute on chronic.  BNP is elevated but in the presence of elevated lactic acid as well.   high-sensitivity troponin is elevated which could represent sepsis but is also concerning for NSTEMI.  Further evaluation by cardiology recommended on heparine drip- likely seems stress induced. 4.  Hyponatremia: Secondary to chronic lung disease. 5.  DVT prophylaxis:heparine drip. 6.  GI prophylaxis: None    All the records are reviewed and case discussed with Care Management/Social Workerr. Management plans discussed with the patient, family and they are in agreement.  CODE STATUS: Full.  TOTAL TIME TAKING CARE OF THIS PATIENT: *35 minutes.     POSSIBLE D/C IN 1-2 DAYS, DEPENDING ON CLINICAL CONDITION.   Vaughan Basta M.D on 05/15/2019   Between 7am to 6pm - Pager - [phone]  After 6pm go to [email or web address] - password [email or web address] [place] [facility]  Office  [phone]  CC: Primary care  physician; Patient, No Pcp Per  Note: This dictation was prepared with Dragon dictation along with smaller phrase technology. Any transcriptional errors that result from this process are unintentional.

## 2019-05-15 NOTE — Progress Notes (Signed)
  Follow up Note  He required BiPAP upon admission and is now mainly using 3L [place] with one bout of BiPAP today, per patient request, following sitting up eating breakfast lasting around 2 hrs. Following BiPAP treatment and a dose xanax  patient reported feeling less SOB and requested [place]. Patient placed back on 3L [place] still reporting SOB but with improvement. He continues to have a productive cough, denying any hemoptysis. He denies any chest pain or chest tightness.He reports a lot of anxiety that is precipitated when he begins to get SOB. He states his anxiety has been noticeable the last few months, associated with his health status and recent loss of a vehicle, causing him to walk to the store, which has been more challenging with his recent increase in SOB. He admits to drinking 5-6 beers regularly. He is a current smoker, recently started on chantix, reporting a decrease in use to half a pack a day.   Continue to monitor resp status Await cards recs

## 2019-05-15 NOTE — Progress Notes (Signed)
ANTICOAGULATION CONSULT NOTE  Pharmacy Consult for heparin Indication: chest pain/ACS  Patient Measurements: Height: 5\' 8"  (172.7 cm) Weight: 145 lb (65.8 kg) IBW/kg (Calculated) : 68.4 kg Heparin Dosing Weight: 65.8 kg  Vital Signs: Temp: 97.9 F (36.6 C) (07/15 0200) Temp Source: Oral (07/15 0200) BP: 109/72 (07/15 0200) Pulse Rate: 86 (07/15 0200)  Labs: Recent Labs    05/14/19 0113 05/14/19 0240 05/14/19 0527 05/14/19 0743 05/14/19 1021 05/14/19 1951 05/15/19 0255  HGB 15.9  --   --   --   --   --  13.3  HCT 44.5  --   --   --   --   --  37.5*  PLT 272  --   --   --   --   --  206  APTT  --   --  >160*  --   --   --   --   LABPROT  --   --  13.5  --   --   --   --   INR  --   --  1.0  --   --   --   --   HEPARINUNFRC  --   --   --   --  0.30 0.23* 0.36  CREATININE 0.74  --  0.82  --   --   --  0.68  TROPONINIHS  --  338* 317* 309*  --   --   --     Estimated Creatinine Clearance: 89.1 mL/min (by C-G formula based on SCr of 0.68 mg/dL).   Medical History: Past Medical History:  Diagnosis Date  . CHF (congestive heart failure) (HCC)    EF: 35-40%, global hypokinesis, mod. mitral regurgitation (03/2019)  . COPD (chronic obstructive pulmonary disease) ([place])   . Diabetes mellitus, type 2 ([place])   . Drug abuse ([place])   . Dupuytren's contracture   . ETOH abuse 2018  . Hypertension   . Mixed hyperlipidemia 01/2018    Medications:  Scheduled:  . albuterol  2.5 mg Nebulization Q4H  . budesonide (PULMICORT) nebulizer solution  0.5 mg Nebulization BID  . chlorhexidine  15 mL Mouth Rinse BID  . Chlorhexidine Gluconate Cloth  6 each Topical Q0600  . docusate sodium  100 mg Oral BID  . famotidine  20 mg Oral BID  . folic acid  1 mg Oral Daily  . insulin aspart  0-5 Units Subcutaneous QHS  . insulin aspart  0-9 Units Subcutaneous TID WC  . mouth rinse  15 mL Mouth Rinse q12n4p  . methylPREDNISolone (SOLU-MEDROL) injection  40 mg Intravenous Q12H  . multivitamin  with minerals  1 tablet Oral Daily  . nicotine  21 mg Transdermal Daily  . thiamine  100 mg Oral Daily   Or  . thiamine  100 mg Intravenous Daily    Assessment: Patient admitted x SOB was here prior in may for PNA. Initial trop was 337 ng/mL,  Patient is not on anticoagulation PTA. Patient is being started on heparin drip for ACS.  Goal of Therapy:  Heparin level 0.3-0.7 units/ml Monitor platelets by anticoagulation protocol: Yes  Heparin Course: 7/14 am initiation: bolus 4000 units IV x 1, then 1050 units/hr  7/14 1021 HL 0.30 7/14 1951 HL 0.23   Plan:  07/15 @ 0300 HL 0.36 therapeutic. Will continue rate of 1250 units/hr and will recheck HL @ 0900, CBC has trended down from baseline, but still within range, will continue to monitor.  Tobie Lords, PharmD, BCPS  Clinical Pharmacist 05/15/2019,4:04 AM

## 2019-05-16 LAB — BASIC METABOLIC PANEL
Anion gap: 11 (ref 5–15)
BUN: 17 mg/dL (ref 8–23)
CO2: 29 mmol/L (ref 22–32)
Calcium: 8.8 mg/dL — ABNORMAL LOW (ref 8.9–10.3)
Chloride: 90 mmol/L — ABNORMAL LOW (ref 98–111)
Creatinine, Ser: 0.76 mg/dL (ref 0.61–1.24)
GFR calc Af Amer: >60 mL/min (ref >60)
GFR calc non Af Amer: >60 mL/min (ref >60)
Glucose, Bld: 137 mg/dL — ABNORMAL HIGH (ref 70–99)
Potassium: 3.5 mmol/L (ref 3.5–5.1)
Sodium: 130 mmol/L — ABNORMAL LOW (ref 135–145)

## 2019-05-16 LAB — CBC
HCT: 38.5 % — ABNORMAL LOW (ref 39.0–52.0)
Hemoglobin: 13.6 g/dL (ref 13.0–17.0)
MCH: 33.8 pg (ref 26.0–34.0)
MCHC: 35.3 g/dL (ref 30.0–36.0)
MCV: 95.8 fL (ref 80.0–100.0)
Platelets: 243 10*3/uL (ref 150–400)
RBC: 4.02 MIL/uL — ABNORMAL LOW (ref 4.22–5.81)
RDW: 13.4 % (ref 11.5–15.5)
WBC: 8.6 10*3/uL (ref 4.0–10.5)
nRBC: 0 % (ref 0.0–0.2)

## 2019-05-16 LAB — GLUCOSE, CAPILLARY
Glucose-Capillary: 159 mg/dL — ABNORMAL HIGH (ref 70–99)
Glucose-Capillary: 226 mg/dL — ABNORMAL HIGH (ref 70–99)
Glucose-Capillary: 108 mg/dL — ABNORMAL HIGH (ref 70–99)
Glucose-Capillary: 132 mg/dL — ABNORMAL HIGH (ref 70–99)

## 2019-05-16 LAB — HEPARIN LEVEL (UNFRACTIONATED): Heparin Unfractionated: 0.54 IU/mL (ref 0.30–0.70)

## 2019-05-16 MED ORDER — POTASSIUM CHLORIDE 20 MEQ PO PACK
40.0000 meq | PACK | Freq: Two times a day (BID) | ORAL | Status: AC
  Administered 2019-05-16 (×2): 40 meq via ORAL
  Filled 2019-05-16 (×2): qty 2

## 2019-05-16 NOTE — Progress Notes (Signed)
ANTICOAGULATION CONSULT NOTE  Pharmacy Consult for heparin Indication: chest pain/ACS  Patient Measurements: Height: 5\' 8"  (172.7 cm) Weight: 154 lb 5.2 oz (70 kg) IBW/kg (Calculated) : 68.4 kg Heparin Dosing Weight: 65.8 kg  Vital Signs: Temp: 98.1 F (36.7 C) (07/16 0200) Temp Source: Oral (07/16 0200) BP: 125/90 (07/16 0600) Pulse Rate: 92 (07/16 0600)  Labs: Recent Labs    05/14/19 0113 05/14/19 0240 05/14/19 0527 05/14/19 0743  05/15/19 0255 05/15/19 0848 05/16/19 0556  HGB 15.9  --   --   --   --  13.3  --  13.6  HCT 44.5  --   --   --   --  37.5*  --  38.5*  PLT 272  --   --   --   --  206  --  243  APTT  --   --  >160*  --   --   --   --   --   LABPROT  --   --  13.5  --   --   --   --   --   INR  --   --  1.0  --   --   --   --   --   HEPARINUNFRC  --   --   --   --    < > 0.36 0.53 0.54  CREATININE 0.74  --  0.82  --   --  0.68  --  0.76  TROPONINIHS  --  338* 317* 309*  --   --   --   --    < > = values in this interval not displayed.    Estimated Creatinine Clearance: 92.6 mL/min (by C-G formula based on SCr of 0.76 mg/dL).   Medical History: Past Medical History:  Diagnosis Date  . CHF (congestive heart failure) (HCC)    EF: 35-40%, global hypokinesis, mod. mitral regurgitation (03/2019)  . COPD (chronic obstructive pulmonary disease) (HCC)   . Diabetes mellitus, type 2 (HCC)   . Drug abuse (HCC)   . Dupuytren's contracture   . ETOH abuse 2018  . Hypertension   . Mixed hyperlipidemia 01/2018    Medications:  Scheduled:  . albuterol  2.5 mg Nebulization Q4H  . budesonide (PULMICORT) nebulizer solution  0.5 mg Nebulization BID  . cetirizine  10 mg Oral Daily  . chlorhexidine  15 mL Mouth Rinse BID  . Chlorhexidine Gluconate Cloth  6 each Topical Q0600  . docusate sodium  100 mg Oral BID  . famotidine  20 mg Oral BID  . folic acid  1 mg Oral Daily  . furosemide  20 mg Intravenous Q12H  . insulin aspart  0-5 Units Subcutaneous QHS  .  insulin aspart  0-9 Units Subcutaneous TID WC  . mouth rinse  15 mL Mouth Rinse q12n4p  . methylPREDNISolone (SOLU-MEDROL) injection  40 mg Intravenous Q12H  . multivitamin with minerals  1 tablet Oral Daily  . nicotine  21 mg Transdermal Daily  . thiamine  100 mg Oral Daily   Or  . thiamine  100 mg Intravenous Daily    Assessment: Patient admitted x SOB was here prior in may for PNA. Initial trop was 337 ng/mL,  Patient is not on anticoagulation PTA. Patient is being started on heparin drip for ACS.  Goal of Therapy:  Heparin level 0.3-0.7 units/ml Monitor platelets by anticoagulation protocol: Yes  Heparin Course: 7/14 am initiation: bolus 4000 units IV x 1, then 1050 units/hr  7/14 1021 HL 0.30: rate inc to 1150 units/hr 7/14 1951 HL 0.23: 900 unit bolus, then inc to 1250 units/hr 7/15 0300 HL 0.36: no change 7/15 0848 HL 0.53: no change   Plan:  07/16 @ 0600 HL 0.54 therapeutic. Will continue rate at 1250 units/hr and will recheck HL w/ am labs, CBC stable will continue to monitor.  Tobie Lords, PharmD, BCPS Clinical Pharmacist 05/16/2019,6:33 AM

## 2019-05-16 NOTE — Evaluation (Signed)
Physical Therapy Evaluation Patient Details Name: Brandon Bolton MRN: [id number] DOB: 16-Jul-1956 Today's Date: 05/16/2019   History of Present Illness  63 y.o. male with a history of CHF, COPD, HTN, HLD, and T2DM admitted for SOB x 3-4 days, treated for pneumonia in May 2020. Admitting EKG shows possible new LBBB as previous EKG not available accompanied with elevated troponins concerning for NSTEMI.  Clinical Impression  Pt did well with ambulation and vitals remained stable and though he had some minimal fatigue he ultimately did well.  Pt on 3L O2 t/o the effort and sats stayed in the high 90s.  Pt with no LOBs or overt safety issues, ultimately appears appropriate to go home per today's performance - will still need to trial flight of steps, though he will likely be able to manage these w/o issue.  Will maintain on PT caseload to address this and insure that pt stays as active as possible.    Follow Up Recommendations No PT follow up    Equipment Recommendations  None recommended by PT    Recommendations for Other Services       Precautions / Restrictions Precautions Precautions: (low fall risk) Restrictions Weight Bearing Restrictions: No      Mobility  Bed Mobility Overal bed mobility: Independent                Transfers Overall transfer level: Independent Equipment used: None             General transfer comment: Able to rise w/o hesitation or safety issues  Ambulation/Gait Ambulation/Gait assistance: Supervision Gait Distance (Feet): 200 Feet Assistive device: None       General Gait Details: Pt is able to ambulate with consistent and safe ambulation.  He was on 3L the entire time and sats stayed in the mid/upper 90s and HR only rose a few points and never exceeded 120 bpm.  Pt with mild faitgue, but generally felt close to his baseline.  Stairs            Wheelchair Mobility    Modified Rankin (Stroke Patients Only)       Balance Overall  balance assessment: Independent                                           Pertinent Vitals/Pain Pain Assessment: No/denies pain    Home Living Family/patient expects to be discharged to:: Private residence Living Arrangements: Alone Available Help at Discharge: Friend(s);Family   Home Access: Stairs to enter Entrance Stairs-Rails: Right Entrance Stairs-Number of Steps: flight Home Layout: One level Home Equipment: None      Prior Function Level of Independence: Independent         Comments: does not have a vehicle but family/friends will take him to run errands     Hand Dominance        Extremity/Trunk Assessment   Upper Extremity Assessment Upper Extremity Assessment: Overall WFL for tasks assessed    Lower Extremity Assessment Lower Extremity Assessment: Overall WFL for tasks assessed       Communication   Communication: No difficulties  Cognition Arousal/Alertness: Awake/alert Behavior During Therapy: WFL for tasks assessed/performed Overall Cognitive Status: Within Functional Limits for tasks assessed  General Comments      Exercises     Assessment/Plan    PT Assessment Patient needs continued PT services  PT Problem List Decreased activity tolerance;Cardiopulmonary status limiting activity       PT Treatment Interventions Stair training;Functional mobility training;Therapeutic exercise;Therapeutic activities;Patient/family education;Gait training    PT Goals (Current goals can be found in the Care Plan section)  Acute Rehab PT Goals Patient Stated Goal: go home ASAP PT Goal Formulation: With patient Time For Goal Achievement: 05/30/19 Potential to Achieve Goals: Fair    Frequency Min 2X/week   Barriers to discharge        Co-evaluation               AM-PAC PT "6 Clicks" Mobility  Outcome Measure Help needed turning from your back to your side while in a  flat bed without using bedrails?: None Help needed moving from lying on your back to sitting on the side of a flat bed without using bedrails?: None Help needed moving to and from a bed to a chair (including a wheelchair)?: None Help needed standing up from a chair using your arms (e.g., wheelchair or bedside chair)?: None Help needed to walk in hospital room?: None Help needed climbing 3-5 steps with a railing? : None 6 Click Score: 24    End of Session Equipment Utilized During Treatment: Gait belt Activity Tolerance: Patient tolerated treatment well Patient left: in bed;with call bell/phone within reach Nurse Communication: Mobility status PT Visit Diagnosis: Muscle weakness (generalized) (M62.81);Difficulty in walking, not elsewhere classified (R26.2)    Time: [id number] PT Time Calculation (min) (ACUTE ONLY): 21 min   Charges:   PT Evaluation $PT Eval Low Complexity: 1 Low          Malachi ProGalen R Tanya Crothers, DPT 05/16/2019, 5:42 PM

## 2019-05-16 NOTE — Progress Notes (Signed)
[employer] - [place] at [facility]   PATIENT NAME: Brandon Bolton    MR#:  [id number]  DATE OF BIRTH:  25-Mar-1956  SUBJECTIVE:  CHIEF COMPLAINT:   Chief Complaint  Patient presents with  . Shortness of Breath   Patient doing better is very anxious to go home  REVIEW OF SYSTEMS:  CONSTITUTIONAL: No fever, fatigue or weakness.  EYES: No blurred or double vision.  EARS, NOSE, AND THROAT: No tinnitus or ear pain.  RESPIRATORY: have cough, positive shortness of breath, wheezing ,no hemoptysis.  CARDIOVASCULAR: No chest pain, orthopnea, edema.  GASTROINTESTINAL: No nausea, vomiting, diarrhea or abdominal pain.  GENITOURINARY: No dysuria, hematuria.  ENDOCRINE: No polyuria, nocturia,  HEMATOLOGY: No anemia, easy bruising or bleeding SKIN: No rash or lesion. MUSCULOSKELETAL: No joint pain or arthritis.   NEUROLOGIC: No tingling, numbness, weakness.  PSYCHIATRY: No anxiety or depression.   ROS  DRUG ALLERGIES:  No Known Allergies  VITALS:  Blood pressure (!) 149/86, pulse (!) 103, temperature 98.1 F (36.7 C), temperature source Oral, resp. rate 17, height 5\' 8"  (1.727 m), weight 70 kg, SpO2 95 %.  PHYSICAL EXAMINATION:  GENERAL:  63 y.o.-year-old patient lying in the bed with no acute distress.  EYES: Pupils equal, round, reactive to light and accommodation. No scleral icterus. Extraocular muscles intact.  HEENT: Head atraumatic, normocephalic. Oropharynx and nasopharynx clear.  NECK:  Supple, no jugular venous distention. No thyroid enlargement, no tenderness.  LUNGS: Positive accessory muscle usage CARDIOVASCULAR: S1, S2 normal. No murmurs, rubs, or gallops.  ABDOMEN: Soft, nontender, nondistended. Bowel sounds present. No organomegaly or mass.  EXTREMITIES: No pedal edema, cyanosis, or clubbing.  NEUROLOGIC: Cranial nerves II through XII are intact. Muscle strength 4/5 in all extremities. Sensation intact. Gait not checked.  PSYCHIATRIC: The patient is alert  and oriented x 3.  SKIN: No obvious rash, lesion, or ulcer.   Physical Exam LABORATORY PANEL:   CBC Recent Labs  Lab 05/16/19 0556  WBC 8.6  HGB 13.6  HCT 38.5*  PLT 243   ------------------------------------------------------------------------------------------------------------------  Chemistries  Recent Labs  Lab 05/14/19 0113  05/15/19 0255 05/16/19 0556  NA 121*   < > 126* 130*  K 4.5   < > 3.6 3.5  CL 86*   < > 91* 90*  CO2 20*   < > 24 29  GLUCOSE 124*   < > 169* 137*  BUN 18   < > 18 17  CREATININE 0.74   < > 0.68 0.76  CALCIUM 9.1   < > 8.2* 8.8*  MG  --    < > 2.0  --   AST 35  --   --   --   ALT 27  --   --   --   ALKPHOS 65  --   --   --   BILITOT 1.1  --   --   --    < > = values in this interval not displayed.   ------------------------------------------------------------------------------------------------------------------  Cardiac Enzymes No results for input(s): TROPONINI in the last 168 hours. ------------------------------------------------------------------------------------------------------------------  RADIOLOGY:  Dg Chest Port 1 View  Result Date: 05/15/2019 CLINICAL DATA:  Shortness of breath EXAM: PORTABLE CHEST 1 VIEW COMPARISON:  05/14/2019 FINDINGS: Cardiac shadow is enlarged but stable. Vascular congestion is noted with some underlying interstitial edema. The overall appearance is slightly more prominent than that seen on the prior exam. No bony abnormality is noted. No focal infiltrate is seen. IMPRESSION: Changes of congestive failure. Electronically  Signed   By: Inez Catalina M.D.   On: 05/15/2019 08:06    ASSESSMENT AND PLAN:   Active Problems:   Acute on chronic respiratory failure with hypoxemia (HCC)  1.  Respiratory failure: Acute on chronic; with hypoxemia.  Off BiPAP.  COVID negative.  No discrete infiltrate seen on x-[place] but the patient does have diffuse interstitial coarsening.  Continue scheduled duo nebs treatments.   The patient has received broad-spectrum antibiotics but procalcitonin is negative.  Does not consistently meet criteria for sepsis. ABX stopped by ICU. 2.  COPD: With exacerbation; continue steroids as well as inhaled corticosteroid.  3.  CHF: Systolic; acute on chronic.  BNP is elevated but in the presence of elevated lactic acid as well.   high-sensitivity troponin is elevated which could represent sepsis but is also concerning for NSTEMI.    Continue heparin for now  4.  Hyponatremia: Secondary to chronic lung disease. 5.  DVT prophylaxis:heparine drip. 6.  GI prophylaxis: None    All the records are reviewed and case discussed with Care Management/Social Workerr. Management plans discussed with the patient, family and they are in agreement.  CODE STATUS: Full.  TOTAL TIME TAKING CARE OF THIS PATIENT: *35 minutes.     POSSIBLE D/C IN 1-2 DAYS, DEPENDING ON CLINICAL CONDITION.   Dustin Flock M.D on 05/16/2019   Between 7am to 6pm - Pager - [phone]  After 6pm go to [email or web address] - password [email or web address] [place] [facility]  Office  [phone]  CC: Primary care physician; Patient, No Pcp Per  Note: This dictation was prepared with Dragon dictation along with smaller phrase technology. Any transcriptional errors that result from this process are unintentional.

## 2019-05-16 NOTE — Progress Notes (Signed)
Follow up Note:   He has not required BiPAP today. Maintaining adequate oxygen saturation >90% on 3L. He reports an improvement in his SOB. He is eager to get discharged as his only ride is only available till tomorrow before they leave for vacation. Decreased wheezing and rhonchi but still present during ascultation of lungs.   Plan to transfer to med-surg floor

## 2019-05-16 NOTE — Progress Notes (Signed)
Name: Brandon Bolton MRN: [id number] DOB: 1956/09/08    CHIEF COMPLAINT:  Worsening SOB x3-4 days  HISTORY OF PRESENT ILLNESS:  63 y.o. male with a history of CHF, COPD, HTN, HLD, and T2DM admitted for SOB x 3-4 days, treated for pneumonia in May 2020. Admitting EKG shows possible new LBBB as previous EKG not available accompanied with elevated troponins concerning for NSTEMI. Previous Echo shows EF of 35%, repeat Echo during current admission shows EF of 30%. He initially required BiPAP but has since been tolerating 3L [place] well and using BiPAP PRN. Upon entering patients room he is awake and oriented x3. He continues to have SOB and productive cough but reports improvement in these symptoms. He denies use/need of BiPAP last night or today. He states following use of BiPAP or breathing treatments he has a sore throat due to dryness. He denies chest pain or tightness. He states the xanax has been very helpful as he endorses a lot of anxiety. He admits to racing thoughts and constant worry. While in the room he was eating breakfast and denies any nausea, vomiting, abdominal pain, diarrhea, or constipation. He reports having BM last night. He expressed interest in being discharged soon as his only ride will only be available until Friday before they leave for vacation. Cardiology saw patient on Tuesday and made the recommendation for patient to be discharged on a BB, ACE/ ARB for his reduced systolic function at discharge. Patients lower extremity edema, 2+ extending to ankles, has improved slightly. Discussed pumping of feet while in the bed to promote increased flow. He has 2+ pedal pulses but unable to palpate posterior tibialis pulses due to edema. Patient interested in getting up and walking today, referral to PT placed to assist with ambulation. Discussed importance of smoking cessation and reduction in alcohol consumption. He has been using chantix and reports a reduction to half a pack a day.    SIGNIFICANT EVENTS 7/14 admitted for CHF and COPD exacerbation,requiring biPAP 7/15 required biPAP 7/16 off biPAP, transfer to gen med floor  PAST MEDICAL HISTORY :   has a past medical history of CHF (congestive heart failure) ([place]), COPD (chronic obstructive pulmonary disease) ([place]), Diabetes mellitus, type 2 ([place]), Drug abuse ([place]), Dupuytren's contracture, ETOH abuse (2018), Hypertension, and Mixed hyperlipidemia (01/2018).  has a past surgical history that includes Hand surgery. No Known Allergies  REVIEW OF SYSTEMS:    Gen:  Denies  fever, sweats, chills  HEENT: Denies blurred vision, double vision, ear pain, eye pain, hearing loss, nose bleeds, +sore throat Cardiac:  No dizziness, chest pain or heaviness, chest tightness,edema Resp:  + cough, +sputum production, +shortness of breath,+wheezing, -hemoptysis,  Gi: Denies swallowing difficulty, stomach pain, nausea or vomiting, diarrhea, constipation, bowel incontinence Gu:  Denies bladder incontinence, burning urine Ext:   Denies Joint pain, stiffness or swelling Skin: Denies  skin rash, easy bruising or bleeding or hives Endoc:  Denies polyuria, polydipsia , polyphagia or weight change Psych:   Denies depression, insomnia or hallucinations, + anxiety  Other:  All other systems negative  VITAL SIGNS: Temp:  [97.9 F (36.6 C)-98.1 F (36.7 C)] 98.1 F (36.7 C) (07/16 0200) Pulse Rate:  [75-123] 103 (07/16 0800) Resp:  [9-20] 17 (07/16 0800) BP: (104-168)/(65-105) 149/86 (07/16 0800) SpO2:  [90 %-99 %] 95 % (07/16 0600) FiO2 (%):  [36 %] 36 % (07/15 1002)   I/O last 3 completed shifts: In: 1473.4 [P.O.:1080; I.V.:393.4] Out: 3465 [Urine:3465] No intake/output data recorded.  SpO2: 95 % O2 Flow Rate (L/min): 3 L/min FiO2 (%): 36 %   Physical Examination:  GENERAL: ill appearing,  HEAD: Normocephalic, atraumatic.  EYES: Pupils equal, round, reactive to light.  No scleral icterus.  MOUTH: Moist mucosal membrane.  NECK: Supple. No JVD.  PULMONARY: +rhonchi, +wheezing CARDIOVASCULAR: Distant heart sounds, 2+ and equal radial pulses, 2+ pedal pulses GASTROINTESTINAL: Soft, nontender, -distended. No masses. Positive bowel sounds. No hepatosplenomegaly.  MUSCULOSKELETAL: No swelling, clubbing, + ankle 2+ edema NEUROLOGIC: Alert and oriented x 3, able to follow simple commands SKIN:intact,warm,dry   MEDICATIONS: I have reviewed all medications and confirmed regimen as documented   CULTURE RESULTS   Recent Results (from the past 240 hour(s))  SARS Coronavirus 2 (CEPHEID- Performed in [facility] hospital lab), Hosp Order     Status: None   Collection Time: 05/14/19  1:13 AM   Specimen: Nasopharyngeal Swab  Result Value Ref Range Status   SARS Coronavirus 2 NEGATIVE NEGATIVE Final    Comment: (NOTE) If result is NEGATIVE SARS-CoV-2 target nucleic acids are NOT DETECTED. The SARS-CoV-2 RNA is generally detectable in upper and lower  respiratory specimens during the acute phase of infection. The lowest  concentration of SARS-CoV-2 viral copies this assay can detect is 250  copies / mL. A negative result does not preclude SARS-CoV-2 infection  and should not be used as the sole basis for treatment or other  patient management decisions.  A negative result may occur with  improper specimen collection / handling, submission of specimen other  than nasopharyngeal swab, presence of viral mutation(s) within the  areas targeted by this assay, and inadequate number of viral copies  (<250 copies / mL). A negative result must be combined with clinical  observations, patient history, and epidemiological information. If result is POSITIVE SARS-CoV-2 target nucleic acids are DETECTED. The SARS-CoV-2 RNA is generally detectable in upper and lower  respiratory specimens dur ing the acute phase of infection.  Positive  results are indicative of active infection with SARS-CoV-2.  Clinical  correlation with  patient history and other diagnostic information is  necessary to determine patient infection status.  Positive results do  not rule out bacterial infection or co-infection with other viruses. If result is PRESUMPTIVE POSTIVE SARS-CoV-2 nucleic acids MAY BE PRESENT.   A presumptive positive result was obtained on the submitted specimen  and confirmed on repeat testing.  While 2019 novel coronavirus  (SARS-CoV-2) nucleic acids may be present in the submitted sample  additional confirmatory testing may be necessary for epidemiological  and / or clinical management purposes  to differentiate between  SARS-CoV-2 and other Sarbecovirus currently known to infect humans.  If clinically indicated additional testing with an alternate test  methodology [phone]([id number]) is advised. The SARS-CoV-2 RNA is generally  detectable in upper and lower respiratory sp ecimens during the acute  phase of infection. The expected result is Negative. Fact Sheet for Patients:  BoilerBrush.com.cyhttps://www.fda.gov/media/136312/download Fact Sheet for Healthcare Providers: https://pope.com/https://www.fda.gov/media/136313/download This test is not yet approved or cleared by the [place] FDA and has been authorized for detection and/or diagnosis of SARS-CoV-2 by FDA under an Emergency Use Authorization (EUA).  This EUA will remain in effect (meaning this test can be used) for the duration of the COVID-19 declaration under Section 564(b)(1) of the Act, 21 U.S.C. section 360bbb-3(b)(1), unless the authorization is terminated or revoked sooner. Performed at [facility], [address] [place], [place] [address]   Blood Culture (routine x 2)  Status: None (Preliminary result)   Collection Time: 05/14/19  1:14 AM   Specimen: BLOOD  Result Value Ref Range Status   Specimen Description BLOOD RIGHT ASSIST CONTROL  Final   Special Requests   Final    BOTTLES DRAWN AEROBIC AND ANAEROBIC Blood Culture adequate volume   Culture   Final     NO GROWTH 1 DAY Performed at [facility], [address] [place], [place] [address]    Report Status PENDING  Incomplete  MRSA PCR Screening     Status: None   Collection Time: 05/14/19  4:41 AM   Specimen: Nasal Mucosa; Nasopharyngeal  Result Value Ref Range Status   MRSA by PCR NEGATIVE NEGATIVE Final    Comment:        The GeneXpert MRSA Assay (FDA approved for NASAL specimens only), is one component of a comprehensive MRSA colonization surveillance program. It is not intended to diagnose MRSA infection nor to guide or monitor treatment for MRSA infections. Performed at [facility], [address] [place], [place] [address]   Blood Culture (routine x 2)     Status: None (Preliminary result)   Collection Time: 05/14/19  5:26 AM   Specimen: BLOOD  Result Value Ref Range Status   Specimen Description BLOOD LEFT WRIST  Final   Special Requests   Final    BOTTLES DRAWN AEROBIC AND ANAEROBIC Blood Culture adequate volume   Culture   Final    NO GROWTH 1 DAY Performed at [facility], [address] [place], [place] [address]    Report Status PENDING  Incomplete        Indwelling Urinary Catheter continued, requirement due to   Reason to continue Indwelling Urinary Catheter strict Intake/Output monitoring for hemodynamic instability     ASSESSMENT AND PLAN SYNOPSIS  63 y.o. white male admitted for acute hypoxic/hypercampnic respiratory failure with pulmonary edema likely from acute systolic heart failure with probably ACS with NSTEMI contributing to COPD exacerbation in the setting of active smoking and alcohol abuse.   Severe ACUTE Hypoxic and Hypercapnic Respiratory Failure Slowly resolving -continue Bronchodilator Therapy -Wean Fio2 as tolerated   ACUTE SYSTOLIC CARDIAC FAILURE- EF (86%30%) -oxygen as needed -Lasix as tolerated -follow up cardiac enzymes as indicated -follow up cardiology recs   SEVERE COPD EXACERBATION  -continue IV steroids as prescribed -continue NEB THERAPY as prescribed -morphine as needed -wean fio2 as needed and tolerated   ID -follow up cultures  GI GI PROPHYLAXIS as indicated  NUTRITIONAL STATUS DIET-->Diet as tolerated Constipation protocol as indicated   ENDO - will use ICU hypoglycemic\Hyperglycemia protocol if needed  PSYCH -Xanax PRN for anxiety  ELECTROLYTES -follow labs as needed -replace as needed -pharmacy consultation and following   DVT/GI PRX ordered TRANSFUSIONS AS NEEDED MONITOR FSBS ASSESS the need for LABS   Plan to transfer to gen med floor    Keevan Wolz Santiago Gladavid Yeiden Frenkel, M.D.  [facility]  Medical Director [facility] Medical Director [facility] Cardio-Pulmonary Department

## 2019-05-16 NOTE — Progress Notes (Signed)
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  Off of BiPAP.  Feels a lot better.  Anxious to go home.   Subjective   Feels better.  Less short of breath no chest pain.  Inpatient Medications    . albuterol  2.5 mg Nebulization Q4H  . budesonide (PULMICORT) nebulizer solution  0.5 mg Nebulization BID  . cetirizine  10 mg Oral Daily  . chlorhexidine  15 mL Mouth Rinse BID  . Chlorhexidine Gluconate Cloth  6 each Topical Q0600  . docusate sodium  100 mg Oral BID  . famotidine  20 mg Oral BID  . folic acid  1 mg Oral Daily  . furosemide  20 mg Intravenous Q12H  . insulin aspart  0-5 Units Subcutaneous QHS  . insulin aspart  0-9 Units Subcutaneous TID WC  . mouth rinse  15 mL Mouth Rinse q12n4p  . methylPREDNISolone (SOLU-MEDROL) injection  40 mg Intravenous Q12H  . multivitamin with minerals  1 tablet Oral Daily  . nicotine  21 mg Transdermal Daily  . potassium chloride  40 mEq Oral BID  . thiamine  100 mg Oral Daily   Or  . thiamine  100 mg Intravenous Daily    Vital Signs    Vitals:   05/16/19 0600 05/16/19 0700 05/16/19 0800 05/16/19 1100  BP: 125/90 117/77 (!) 149/86   Pulse: 92 93 (!) 103   Resp: 12 12 17    Temp:    98.3 F (36.8 C)  TempSrc:    Oral  SpO2: 95%     Weight:      Height:        Intake/Output Summary (Last 24 hours) at 05/16/2019 1646 Last data filed at 05/16/2019 1431 Gross per 24 hour  Intake 617.46 ml  Output 2465 ml  Net -1847.54 ml   Filed Weights   05/14/19 0110 05/15/19 0500  Weight: 65.8 kg 70 kg    Physical Exam    GEN: Well nourished, well developed, in no acute distress.  HEENT: normal.  Neck: Supple, no JVD, carotid bruits, or masses. Cardiac: RRR, no murmurs, rubs, or gallops. No clubbing, cyanosis,  edema.  Radials/DP/PT 2+ and equal bilaterally.  Respiratory:  Respirations regular and unlabored, clear to auscultation bilaterally. GI: Soft, nontender, nondistended, BS + x 4. MS: no deformity or atrophy. Skin: warm and dry, no rash. Neuro:  Strength and sensation are intact. Psych: Normal affect.  Labs    CBC Recent Labs    05/14/19 0113 05/15/19 0255 05/16/19 0556  WBC 9.5 8.7 8.6  NEUTROABS 7.8*  --   --   HGB 15.9 13.3 13.6  HCT 44.5 37.5* 38.5*  MCV 94.1 94.9 95.8  PLT 272 206 382   Basic Metabolic Panel Recent Labs    05/14/19 0527 05/15/19 0255 05/16/19 0556  NA 123* 126* 130*  K 5.0 3.6 3.5  CL 90* 91* 90*  CO2 22 24 29   GLUCOSE 146* 169* 137*  BUN 19 18 17   CREATININE 0.82 0.68 0.76  CALCIUM 8.5* 8.2* 8.8*  MG 2.0 2.0  --   PHOS 4.5  --   --    Liver Function Tests Recent Labs    05/14/19 0113  AST 35  ALT 27  ALKPHOS 65  BILITOT 1.1  PROT 7.5  ALBUMIN 4.5   No results for input(s): LIPASE, AMYLASE in the last 72 hours. Cardiac Enzymes No results for input(s): CKTOTAL, CKMB, CKMBINDEX, TROPONINI in the last 72 hours. BNP Recent Labs    05/14/19 0113  BNP 765.0*   D-Dimer No results for input(s): DDIMER in the last 72 hours. Hemoglobin A1C Recent Labs    05/15/19 0255  HGBA1C 5.3   Fasting Lipid Panel No results for input(s): CHOL, HDL, LDLCALC, TRIG, CHOLHDL, LDLDIRECT in the last 72 hours. Thyroid Function Tests Recent Labs    05/14/19 0527  TSH 2.533    Telemetry    Sinus rhythm  ECG      Radiology    Dg Chest Port 1 View  Result Date: 05/15/2019 CLINICAL DATA:  Shortness of breath EXAM: PORTABLE CHEST 1 VIEW COMPARISON:  05/14/2019 FINDINGS: Cardiac shadow is enlarged but stable. Vascular congestion is noted with some underlying interstitial edema. The overall appearance is slightly more prominent than that seen on the prior exam. No bony abnormality is noted. No focal infiltrate is seen. IMPRESSION: Changes of  congestive failure. Electronically Signed   By: Alcide CleverMark  Lukens M.D.   On: 05/15/2019 08:06   Dg Chest Portable 1 View  Result Date: 05/14/2019 CLINICAL DATA:  Respiratory distress EXAM: PORTABLE CHEST 1 VIEW COMPARISON:  03/05/2019 FINDINGS: Borderline to mild cardiomegaly. No focal consolidation. Diffuse bilateral streaky and interstitial pulmonary opacity which may be secondary to acute interstitial inflammatory process on underlying chronic lung disease. Consider also viral or atypical pneumonia. No pneumothorax. IMPRESSION: Diffuse bilateral interstitial and streaky lung opacity which may be due to acute interstitial inflammatory process on underlying chronic change. Differential also includes viral or atypical pneumonia. Electronically Signed   By: Jasmine PangKim  Fujinaga M.D.   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  No regional wall motion normality.  Elevated troponin at 338 does not appear to be secondary to acute coronary syndrome.  Demand ischemia.  Will stop heparin.  Continue current medical regimen ambulate and consider discharge.  Further work-up as an outpatient.  Stop drinking alcohol.  Signed, Darlin PriestlyKenneth A. Geraldy Akridge MD 05/16/2019, 4:46 PM  Pager: [phone]

## 2019-05-17 LAB — BASIC METABOLIC PANEL
Anion gap: 12 (ref 5–15)
BUN: 18 mg/dL (ref 8–23)
CO2: 30 mmol/L (ref 22–32)
Calcium: 9.4 mg/dL (ref 8.9–10.3)
Chloride: 93 mmol/L — ABNORMAL LOW (ref 98–111)
Creatinine, Ser: 0.65 mg/dL (ref 0.61–1.24)
GFR calc Af Amer: >60 mL/min (ref >60)
GFR calc non Af Amer: >60 mL/min (ref >60)
Glucose, Bld: 133 mg/dL — ABNORMAL HIGH (ref 70–99)
Potassium: 4.1 mmol/L (ref 3.5–5.1)
Sodium: 135 mmol/L (ref 135–145)

## 2019-05-17 LAB — CBC
HCT: 40.8 % (ref 39.0–52.0)
Hemoglobin: 14.2 g/dL (ref 13.0–17.0)
MCH: 33.6 pg (ref 26.0–34.0)
MCHC: 34.8 g/dL (ref 30.0–36.0)
MCV: 96.7 fL (ref 80.0–100.0)
Platelets: 261 10*3/uL (ref 150–400)
RBC: 4.22 MIL/uL (ref 4.22–5.81)
RDW: 13.7 % (ref 11.5–15.5)
WBC: 8.6 10*3/uL (ref 4.0–10.5)
nRBC: 0 % (ref 0.0–0.2)

## 2019-05-17 LAB — MAGNESIUM: Magnesium: 2.3 mg/dL (ref 1.7–2.4)

## 2019-05-17 LAB — GLUCOSE, CAPILLARY: Glucose-Capillary: 126 mg/dL — ABNORMAL HIGH (ref 70–99)

## 2019-05-17 MED ORDER — CARVEDILOL 6.25 MG PO TABS: 6.2500 mg | ORAL_TABLET | Freq: Two times a day (BID) | ORAL | 11 refills | Status: DC

## 2019-05-17 MED ORDER — LOSARTAN POTASSIUM 25 MG PO TABS: 25.0000 mg | ORAL_TABLET | Freq: Every day | ORAL | 11 refills | Status: AC

## 2019-05-17 MED ORDER — CARVEDILOL 6.25 MG PO TABS: 6.2500 mg | ORAL_TABLET | Freq: Two times a day (BID) | ORAL | 11 refills | Status: AC

## 2019-05-17 MED ORDER — LOSARTAN POTASSIUM 25 MG PO TABS: 25.0000 mg | ORAL_TABLET | Freq: Every day | ORAL | 11 refills | Status: DC

## 2019-05-17 NOTE — Progress Notes (Signed)
[employer] [facility] [place] [facility]   PATIENT NAME: Brandon Bolton    MR#:  [id number]  DATE OF BIRTH:  Jan 28, 1956  SUBJECTIVE:  CHIEF COMPLAINT:   Chief Complaint  Patient presents with  . Shortness of Breath   Patient's breathing is much improved is currently on room air.  REVIEW OF SYSTEMS:  CONSTITUTIONAL: No fever, fatigue or weakness.  EYES: No blurred or double vision.  EARS, NOSE, AND THROAT: No tinnitus or ear pain.  RESPIRATORY: have cough, resolved shortness of breath, wheezing ,no hemoptysis.  CARDIOVASCULAR: No chest pain, orthopnea, edema.  GASTROINTESTINAL: No nausea, vomiting, diarrhea or abdominal pain.  GENITOURINARY: No dysuria, hematuria.  ENDOCRINE: No polyuria, nocturia,  HEMATOLOGY: No anemia, easy bruising or bleeding SKIN: No rash or lesion. MUSCULOSKELETAL: No joint pain or arthritis.   NEUROLOGIC: No tingling, numbness, weakness.  PSYCHIATRY: No anxiety or depression.   ROS  DRUG ALLERGIES:  No Known Allergies  VITALS:  Blood pressure 128/81, pulse 87, temperature 97.7 F (36.5 C), temperature source Axillary, resp. rate 11, height 5\' 8"  (1.727 m), weight 65.8 kg, SpO2 93 %.  PHYSICAL EXAMINATION:  GENERAL:  63 y.o.-year-old patient lying in the bed with no acute distress.  EYES: Pupils equal, round, reactive to light and accommodation. No scleral icterus. Extraocular muscles intact.  HEENT: Head atraumatic, normocephalic. Oropharynx and nasopharynx clear.  NECK:  Supple, no jugular venous distention. No thyroid enlargement, no tenderness.  LUNGS: Occasional wheezing noted CARDIOVASCULAR: S1, S2 normal. No murmurs, rubs, or gallops.  ABDOMEN: Soft, nontender, nondistended. Bowel sounds present. No organomegaly or mass.  EXTREMITIES: 1+ pedal edema, cyanosis, or clubbing.  NEUROLOGIC: Cranial nerves II through XII are intact. Muscle strength 4/5 in all extremities. Sensation intact. Gait not checked.  PSYCHIATRIC: The patient is  alert and oriented x 3.  SKIN: No obvious rash, lesion, or ulcer.   Physical Exam LABORATORY PANEL:   CBC Recent Labs  Lab 05/17/19 0434  WBC 8.6  HGB 14.2  HCT 40.8  PLT 261   ------------------------------------------------------------------------------------------------------------------  Chemistries  Recent Labs  Lab 05/14/19 0113  05/17/19 0434  NA 121*   < > 135  K 4.5   < > 4.1  CL 86*   < > 93*  CO2 20*   < > 30  GLUCOSE 124*   < > 133*  BUN 18   < > 18  CREATININE 0.74   < > 0.65  CALCIUM 9.1   < > 9.4  MG  --    < > 2.3  AST 35  --   --   ALT 27  --   --   ALKPHOS 65  --   --   BILITOT 1.1  --   --    < > = values in this interval not displayed.   ------------------------------------------------------------------------------------------------------------------  Cardiac Enzymes No results for input(s): TROPONINI in the last 168 hours. ------------------------------------------------------------------------------------------------------------------  RADIOLOGY:  No results found.  ASSESSMENT AND PLAN:   Active Problems:   Acute on chronic respiratory failure with hypoxemia (HCC)  1.  Respiratory failure: Acute on chronic; with hypoxemia.  Due to acute on chronic COPD exacerbation as well as acute on chronic systolic CHF Off BiPAP.  COVID negative.    Patient doing better plan for discharge home later today 2.  COPD: With exacerbation; continue steroids as well as inhaled corticosteroid.  3.  CHF: Systolic; acute on chronic.    Patient will need outpatient cardiology follow-up diuresis on discharge  high-sensitivity troponin is elevated cardiology did not feel that this was an acute MI felt this was demand ischemia outpatient stress test 4.  Hyponatremia: Secondary to chronic lung disease. 5.  DVT prophylaxis:heparine drip. 6.  GI prophylaxis: None    All the records are reviewed and case discussed with Care Management/Social Workerr. Management  plans discussed with the patient, family and they are in agreement.  CODE STATUS: Full.  TOTAL TIME TAKING CARE OF THIS PATIENT: *35 minutes.     POSSIBLE D/C IN 1-2 DAYS, DEPENDING ON CLINICAL CONDITION.   Dustin Flock M.D on 05/17/2019   Between 7am to 6pm - Pager - [phone]  After 6pm go to [email or web address] - password [email or web address] [place] [facility]  Office  [phone]  CC: Primary care physician; Patient, No Pcp Per  Note: This dictation was prepared with Dragon dictation along with smaller phrase technology. Any transcriptional errors that result from this process are unintentional.

## 2019-05-17 NOTE — Progress Notes (Signed)
Pt given D/C instructions and verbalized understanding

## 2019-05-17 NOTE — Discharge Summary (Addendum)
Physician Discharge Summary  Patient ID: Brandon Bolton MRN: [id number] DOB/AGE: 12/13/1955 63 y.o.  Admit date: 05/14/2019 Discharge date: 05/17/2019  Admission Diagnoses:  Discharge Diagnoses: acute Systolic HF EF 30% ETOH CARDIOMYOPATHY Active Problems:   Acute on chronic respiratory failure with hypoxemia [facility](HCC)   Discharged Condition: stable  Hospital Course:  63 y.o. male with a history of CHF, COPD, HTN, HLD, and T2DM admitted for SOB x 3-4 days, treated for pneumonia in May 2020. Admitting EKG shows possible new LBBB as previous EKG not available accompanied with elevated troponins concerning for NSTEMI. Previous Echo shows EF of 35%, repeat Echo during current admission shows EF of 30%. He initially required BiPAP but has since been tolerating 3L [place] well and using BiPAP PRN. Upon entering patients room he is awake and oriented x3. He continues to have SOB and productive cough but reports improvement in these symptoms. He denies use/need of BiPAP last night or today. He states following use of BiPAP or breathing treatments he has a sore throat due to dryness. He denies chest pain or tightness. He states the xanax has been very helpful as he endorses a lot of anxiety. He admits to racing thoughts and constant worry. While in the room he was eating breakfast and denies any nausea, vomiting, abdominal pain, diarrhea, or constipation. He reports having BM last night. He expressed interest in being discharged soon as his only ride will only be available until Friday before they leave for vacation. Cardiology saw patient on Tuesday and made the recommendation for patient to be discharged on a BB, ACE/ ARB for his reduced systolic function at discharge. Patients lower extremity edema, 2+ extending to ankles, has improved slightly. Discussed pumping of feet while in the bed to promote increased flow. He has 2+ pedal pulses but unable to palpate posterior tibialis pulses due to edema. Patient interested in  getting up and walking today, referral to PT placed to assist with ambulation. Discussed importance of smoking cessation and reduction in alcohol consumption. He has been using chantix and reports a reduction to half a pack a day.  SIGNIFICANT EVENTS 7/14 admitted for CHF and COPD exacerbation,requiring biPAP 7/15 required biPAP 7/16 off biPAP, transfer to gen med floor     Consults: cardiology    Discharge Exam: Blood pressure 128/81, pulse 87, temperature 97.7 F (36.5 C), temperature source Axillary, resp. rate 11, height 5\' 8"  (1.727 m), weight 65.8 kg, SpO2 93 %.   REVIEW OF SYSTEMS:    Gen:  Denies  fever, sweats, chills  HEENT: Denies blurred vision, double vision, ear pain, eye pain, hearing loss, nose bleeds, +sore throat Cardiac:  No dizziness, chest pain or heaviness, chest tightness,edema Resp:  + cough, +sputum production, +shortness of breath,+wheezing, -hemoptysis,  Gi: Denies swallowing difficulty, stomach pain, nausea or vomiting, diarrhea, constipation, bowel incontinence Gu:  Denies bladder incontinence, burning urine Ext:   Denies Joint pain, stiffness or swelling Skin: Denies  skin rash, easy bruising or bleeding or hives Endoc:  Denies polyuria, polydipsia , polyphagia or weight change Psych:   Denies depression, insomnia or hallucinations, + anxiety  Other:  All other systems negative  VITAL SIGNS: Temp:  [97.9 F (36.6 C)-98.1 F (36.7 C)] 98.1 F (36.7 C) (07/16 0200) Pulse Rate:  [75-123] 103 (07/16 0800) Resp:  [9-20] 17 (07/16 0800) BP: (104-168)/(65-105) 149/86 (07/16 0800) SpO2:  [90 %-99 %] 95 % (07/16 0600) FiO2 (%):  [36 %] 36 % (07/15 1002)   I/O last 3  completed shifts: In: 1473.4 [P.O.:1080; I.V.:393.4] Out: 3465 [Urine:3465] No intake/output data recorded.   SpO2: 95 % O2 Flow Rate (L/min): 3 L/min FiO2 (%): 36 %   Physical Examination:  GENERAL: ill appearing,  HEAD: Normocephalic, atraumatic.  EYES: Pupils  equal, round, reactive to light.  No scleral icterus.  MOUTH: Moist mucosal membrane. NECK: Supple. No JVD.  PULMONARY: +rhonchi, +wheezing CARDIOVASCULAR: Distant heart sounds, 2+ and equal radial pulses, 2+ pedal pulses GASTROINTESTINAL: Soft, nontender, -distended. No masses. Positive bowel sounds. No hepatosplenomegaly.  MUSCULOSKELETAL: No swelling, clubbing, + ankle 2+ edema NEUROLOGIC: Alert and oriented x 3, able to follow simple commands SKIN:intact,warm,dry  Disposition: Home   Allergies as of 05/17/2019   No Known Allergies     Medication List    STOP taking these medications   meloxicam 15 MG tablet Commonly known as: MOBIC     TAKE these medications   amLODipine 10 MG tablet Commonly known as: NORVASC Take 1 tablet (10 mg total) by mouth daily.   cetirizine 10 MG tablet Commonly known as: ZYRTEC Take 1 tablet by mouth once daily   diphenhydrAMINE 25 mg capsule Commonly known as: BENADRYL Take 25 mg by mouth every 6 (six) hours as needed for itching, allergies or sleep.   neomycin-polymyxin-hydrocortisone OTIC solution Commonly known as: CORTISPORIN Place 4 drops into the left ear 4 (four) times daily.   nicotine 21 mg/24hr patch Commonly known as: NICODERM CQ - dosed in mg/24 hours Place 1 patch (21 mg total) onto the skin daily.   predniSONE 10 MG (21) Tbpk tablet Commonly known as: STERAPRED UNI-PAK 21 TAB Take 1 tablet (10 mg total) by mouth daily. Take 6 tablets by mouth for 1 day followed by  5 tablets by mouth for 1 day followed by  4 tablets by mouth for 1 day followed by  3 tablets by mouth for 1 day followed by  2 tablets by mouth for 1 day followed by  1 tablet by mouth for a day and stop   Symbicort 160-4.5 MCG/ACT inhaler Generic drug: budesonide-formoterol Inhale 2 puffs into the lungs 2 (two) times daily.   tiotropium 18 MCG inhalation capsule Commonly known as: SPIRIVA Place 18 mcg into inhaler and inhale daily.   varenicline  0.5 MG tablet Commonly known as: CHANTIX Take 0.5 mg by mouth 2 (two) times daily.   Ventolin HFA 108 (90 Base) MCG/ACT inhaler Generic drug: albuterol INHALE 2 PUFFS BY MOUTH EVERY 6 HOURS AS NEEDED FOR WHEEZING OR SHORTNESS OF BREATH      COREG 6.25mg  BID Losartan 25 mg daily   Assess for Oxygen needs upon discharge Signed: Trixy Loyola 05/17/2019, 9:01 AM

## 2019-05-17 NOTE — Progress Notes (Signed)
Name: Brandon Bolton MRN: [id number] DOB: 1956/09/13     CHIEF COMPLAINT:  Worsening SOB x 3-4 days  HISTORY OF PRESENT ILLNESS:   63 y.o. male with a history of CHF, COPD, HTN, HLD and T2DM admitted for SOB x 3-4 days. EKG in ED shows possible new LBBB as previous EKG not available accompanied with elevated troponin's, concerning for NSTEMI. Echo demonstrates EF or 30%. He initially was placed on BiPAP and then transitioned to 3L [place] with BiPAP PRN.   EVENTS OVERNIGHT He was seen by PT for evaluation yesterday and was able to ambulate on 3L [place], tolerating this well. Today patient is sitting on side of bed watching TV on RA with an SpO2 of >90%. He is eager to go home. He reports improvement in his SOB and frequency of cough. He denies any chest pain, tightness, or palpitations. He reports recent BM last night. Denies any abdominal pain or bloating. He reports continual anxiety but is relieved to be going home today. Patients understanding of health conditions is poor. Patient encouraged to continue Chantix and reduce smoking and to decrease alcohol consumption.   SIGNIFICANT EVENTS 7/14 admitted for CHF and COPD exacerbation,requiring biPAP 7/15 required biPAP 7/16 off biPAP, evaluated by PT 7/17 off supplemental oxygen, plans to discharge   PAST MEDICAL HISTORY :   has a past medical history of CHF (congestive heart failure) ([place]), COPD (chronic obstructive pulmonary disease) ([place]), Diabetes mellitus, type 2 ([place]), Drug abuse ([place]), Dupuytren's contracture, ETOH abuse (2018), Hypertension, and Mixed hyperlipidemia (01/2018).  has a past surgical history that includes Hand surgery.   No Known Allergies  REVIEW OF SYSTEMS:    Gen:  Denies  fever, sweats, chills HEENT: Denies blurred vision, double vision, ear pain, eye pain, hearing loss, sore throat Cardiac:  No dizziness, chest pain, chest tightness,edema Resp:   +cough, +sputum production, -shortness of breath,-wheezing, -hemoptysis,   Gi: Denies swallowing difficulty, stomach pain, nausea or vomiting, diarrhea, constipation, bowel incontinence Gu:  Denies bladder incontinence, burning urine Ext:   Denies Joint pain, stiffness or swelling Skin: Denies  skin rash, easy bruising or bleeding or hives Endoc:  Denies polyuria, polydipsia , polyphagia Psych:   Denies depression, insomnia or hallucinations, + anxiety  Other:  All other systems negative  VITAL SIGNS: Temp:  [97.7 F (36.5 C)-98.3 F (36.8 C)] 97.7 F (36.5 C) (07/17 0745) Pulse Rate:  [76-110] 87 (07/17 0700) Resp:  [8-21] 11 (07/17 0700) BP: (112-162)/(71-105) 128/81 (07/17 0700) SpO2:  [93 %-100 %] 93 % (07/17 0743) Weight:  [65.8 kg] 65.8 kg (07/17 0443)   I/O last 3 completed shifts: In: 1249.5 [P.O.:960; I.V.:289.5] Out: 3925 [Urine:3925] Total I/O In: -  Out: 450 [Urine:450]   SpO2: 93 % O2 Flow Rate (L/min): 2 L/min FiO2 (%): 36 %   Physical Examination:  GENERAL: sitting up in bed, no acute distress HEAD: Normocephalic, atraumatic.  EYES: Pupils equal, round, reactive to light. EOM intact. No scleral icterus.  MOUTH: Moist mucosal membrane. NECK: Supple. No JVD.  PULMONARY: +rhonchi, -wheezing CARDIOVASCULAR: S1 and S2. Regular rate and rhythm. 2+ radial and dorsalis pedal pulses bilaterally  GASTROINTESTINAL: Soft, nontender, -distended. No masses. Positive bowel sounds. No hepatosplenomegaly.  MUSCULOSKELETAL: No swelling, clubbing, improving trace edema in ankles, mainly right.  NEUROLOGIC: awake and oriented, able to follow simple commands  SKIN:intact,warm,dry   MEDICATIONS: I have reviewed all medications and confirmed regimen as documented   CULTURE RESULTS   Recent Results (from the past 240  hour(s))  SARS Coronavirus 2 (CEPHEID- Performed in [facility]), Hosp Order     Status: None   Collection Time: 05/14/19  1:13 AM   Specimen: Nasopharyngeal Swab  Result Value Ref Range Status   SARS Coronavirus 2  NEGATIVE NEGATIVE Final    Comment: (NOTE) If result is NEGATIVE SARS-CoV-2 target nucleic acids are NOT DETECTED. The SARS-CoV-2 RNA is generally detectable in upper and lower  respiratory specimens during the acute phase of infection. The lowest  concentration of SARS-CoV-2 viral copies this assay can detect is 250  copies / mL. A negative result does not preclude SARS-CoV-2 infection  and should not be used as the sole basis for treatment or other  patient management decisions.  A negative result may occur with  improper specimen collection / handling, submission of specimen other  than nasopharyngeal swab, presence of viral mutation(s) within the  areas targeted by this assay, and inadequate number of viral copies  (<250 copies / mL). A negative result must be combined with clinical  observations, patient history, and epidemiological information. If result is POSITIVE SARS-CoV-2 target nucleic acids are DETECTED. The SARS-CoV-2 RNA is generally detectable in upper and lower  respiratory specimens dur ing the acute phase of infection.  Positive  results are indicative of active infection with SARS-CoV-2.  Clinical  correlation with patient history and other diagnostic information is  necessary to determine patient infection status.  Positive results do  not rule out bacterial infection or co-infection with other viruses. If result is PRESUMPTIVE POSTIVE SARS-CoV-2 nucleic acids MAY BE PRESENT.   A presumptive positive result was obtained on the submitted specimen  and confirmed on repeat testing.  While 2019 novel coronavirus  (SARS-CoV-2) nucleic acids may be present in the submitted sample  additional confirmatory testing may be necessary for epidemiological  and / or clinical management purposes  to differentiate between  SARS-CoV-2 and other Sarbecovirus currently known to infect humans.  If clinically indicated additional testing with an alternate test  methodology [phone]  is advised. The SARS-CoV-2 RNA is generally  detectable in upper and lower respiratory sp ecimens during the acute  phase of infection. The expected result is Negative. Fact Sheet for Patients:  BoilerBrush.com.cyhttps://www.fda.gov/media/136312/download Fact Sheet for Healthcare Providers: https://pope.com/https://www.fda.gov/media/136313/download This test is not yet approved or cleared by the [place] FDA and has been authorized for detection and/or diagnosis of SARS-CoV-2 by FDA under an Emergency Use Authorization (EUA).  This EUA will remain in effect (meaning this test can be used) for the duration of the COVID-19 declaration under Section 564(b)(1) of the Act, 21 U.S.C. section 360bbb-3(b)(1), unless the authorization is terminated or revoked sooner. Performed at [facility], [address] [place], [place] [address]   Blood Culture (routine x 2)     Status: None (Preliminary result)   Collection Time: 05/14/19  1:14 AM   Specimen: BLOOD  Result Value Ref Range Status   Specimen Description BLOOD RIGHT ASSIST CONTROL  Final   Special Requests   Final    BOTTLES DRAWN AEROBIC AND ANAEROBIC Blood Culture adequate volume   Culture   Final    NO GROWTH 3 DAYS Performed at [facility], [address] [place], [place] [address]    Report Status PENDING  Incomplete  MRSA PCR Screening     Status: None   Collection Time: 05/14/19  4:41 AM   Specimen: Nasal Mucosa; Nasopharyngeal  Result Value Ref Range Status   MRSA by  PCR NEGATIVE NEGATIVE Final    Comment:        The GeneXpert MRSA Assay (FDA approved for NASAL specimens only), is one component of a comprehensive MRSA colonization surveillance program. It is not intended to diagnose MRSA infection nor to guide or monitor treatment for MRSA infections. Performed at [facility], [address] [place], [place] [address]   Blood Culture (routine x 2)     Status: None (Preliminary result)   Collection Time:  05/14/19  5:26 AM   Specimen: BLOOD  Result Value Ref Range Status   Specimen Description BLOOD LEFT WRIST  Final   Special Requests   Final    BOTTLES DRAWN AEROBIC AND ANAEROBIC Blood Culture adequate volume   Culture   Final    NO GROWTH 3 DAYS Performed at [facility], [address] [place], [place] [address]    Report Status PENDING  Incomplete         ASSESSMENT AND PLAN SYNOPSIS  63 y.o white male admitted for acute hypoxic/hypercapnic respiratory failure with pulmonary edema likely from acute systolic heart failure from ALCOHOLIC CARDIOMYOPATHY contributing to COPD exacerbation in the setting of active smoking and alcohol abuse.    Severe ACUTE Hypoxic and Hypercapnic Respiratory Failure -Improving -continue Bronchodilator Therapy -Wean Fio2 as tolerated  ACUTE SYSTOLIC CARDIAC FAILURE- EF 30% -oxygen as needed -Lasix as tolerated -follow up cardiology recs -will start Coreg and Losartan therapy Advised to stop excessive alcohol intake   SEVERE COPD EXACERBATION-improved -continue steroids transitioned to oral -continue NEB THERAPY as prescribed -wean fio2 as tolerated  ID  -cultures NGTD  GI GI PROPHYLAXIS as indicated  NUTRITIONAL STATUS DIET--> as tolerated Constipation protocol as indicated   ELECTROLYTES -follow labs as needed -replace as needed -pharmacy consultation and following   DVT/GI PRX ordered TRANSFUSIONS AS NEEDED MONITOR FSBS ASSESS the need for LABS   Plan to D/c home today   Lucie LeatherKurian David Sanad Fearnow, M.D.  [facility]  Medical Director [facility] Medical Director [facility]

## 2019-05-17 NOTE — Discharge Instructions (Signed)
Acute Respiratory Failure, Adult ° °Acute respiratory failure occurs when there is not enough oxygen passing from your lungs to your body. When this happens, your lungs have trouble removing carbon dioxide from the blood. This causes your blood oxygen level to drop too low as carbon dioxide builds up. °Acute respiratory failure is a medical emergency. It can develop quickly, but it is temporary if treated promptly. Your lung capacity, or how much air your lungs can hold, may improve with time, exercise, and treatment. °What are the causes? °There are many possible causes of acute respiratory failure, including: °· Lung injury. °· Chest injury or damage to the ribs or tissues near the lungs. °· Lung conditions that affect the flow of air and blood into and out of the lungs, such as pneumonia, acute respiratory distress syndrome, and cystic fibrosis. °· Medical conditions, such as strokes or spinal cord injuries, that affect the muscles and nerves that control breathing. °· Blood infection (sepsis). °· Inflammation of the pancreas (pancreatitis). °· A blood clot in the lungs (pulmonary embolism). °· A large-volume blood transfusion. °· Burns. °· Near-drowning. °· Seizure. °· Smoke inhalation. °· Reaction to medicines. °· Alcohol or drug overdose. °What increases the risk? °This condition is more likely to develop in people who have: °· A blocked airway. °· Asthma. °· A condition or disease that damages or weakens the muscles, nerves, bones, or tissues that are involved in breathing. °· A serious infection. °· A health problem that blocks the unconscious reflex that is involved in breathing, such as hypothyroidism or sleep apnea. °· A lung injury or trauma. °What are the signs or symptoms? °Trouble breathing is the main symptom of acute respiratory failure. Symptoms may also include: °· Rapid breathing. °· Restlessness or anxiety. °· Skin, lips, or fingernails that appear blue (cyanosis). °· Rapid heart  rate. °· Abnormal heart rhythms (arrhythmias). °· Confusion or changes in behavior. °· Tiredness or loss of energy. °· Feeling sleepy or having a loss of consciousness. °How is this diagnosed? °Your health care provider can diagnose acute respiratory failure with a medical history and physical exam. During the exam, your health care provider will listen to your heart and check for crackling or wheezing sounds in your lungs. Your may also have tests to confirm the diagnosis and determine what is causing respiratory failure. These tests may include: °· Measuring the amount of oxygen in your blood (pulse oximetry). The measurement comes from a small device that is placed on your finger, earlobe, or toe. °· Other blood tests to measure blood gases and to look for signs of infection. °· Sampling your cerebral spinal fluid or tracheal fluid to check for infections. °· Chest X-Dansby to look for fluid in spaces that should be filled with air. °· Electrocardiogram (ECG) to look at the heart's electrical activity. °How is this treated? °Treatment for this condition usually takes places in a hospital intensive care unit (ICU). Treatment depends on what is causing the condition. It may include one or more treatments until your symptoms improve. Treatment may include: °· Supplemental oxygen. Extra oxygen is given through a tube in the nose, a face mask, or a hood. °· A device such as a continuous positive airway pressure (CPAP) or bi-level positive airway pressure (BiPAP or BPAP) machine. This treatment uses mild air pressure to keep the airways open. A mask or other device will be placed over your nose or mouth. A tube that is connected to a motor will deliver oxygen through the   mask.  Ventilator. This treatment helps move air into and out of the lungs. This may be done with a bag and mask or a machine. For this treatment, a tube is placed in your windpipe (trachea) so air and oxygen can flow to the lungs.  Extracorporeal  membrane oxygenation (ECMO). This treatment temporarily takes over the function of the heart and lungs, supplying oxygen and removing carbon dioxide. ECMO gives the lungs a chance to recover. It may be used if a ventilator is not effective.  Tracheostomy. This is a procedure that creates a hole in the neck to insert a breathing tube.  Receiving fluids and medicines.  Rocking the bed to help breathing. Follow these instructions at home:  Take over-the-counter and prescription medicines only as told by your health care provider.  Return to normal activities as told by your health care provider. Ask your health care provider what activities are safe for you.  Keep all follow-up visits as told by your health care provider. This is important. How is this prevented? Treating infections and medical conditions that may lead to acute respiratory failure can help prevent the condition from developing. Contact a health care provider if:  You have a fever.  Your symptoms do not improve or they get worse. Get help right away if:  You are having trouble breathing.  You lose consciousness.  Your have cyanosis or turn blue.  You develop a rapid heart rate.  You are confused. These symptoms may represent a serious problem that is an emergency. Do not wait to see if the symptoms will go away. Get medical help right away. Call your local emergency services (911 in the [place] Do not drive yourself to the hospital. This information is not intended to replace advice given to you by your health care provider. Make sure you discuss any questions you have with your health care provider. Document Released: 10/22/2013 Document Revised: 09/29/2017 Document Reviewed: 05/04/2016 Elsevier Patient Education  [place].  Heart Failure and Exercise Heart failure is a condition in which the heart does not fill or pump enough blood and oxygen to support your body and its functions. Heart failure is a  long-term (chronic) condition. Living with heart failure can be challenging. However, following your health care provider's instructions about a healthy lifestyle may help improve your symptoms. This includes choosing the right exercise plan. Doing daily physical activity is important after a diagnosis of heart failure. You may have some activity restrictions, so talk to your health care provider before doing any exercises. What are the benefits of exercise? Exercise may:  Make your heart muscles stronger.  Lower your blood pressure.  Lower your cholesterol.  Help you lose weight.  Help your bones stay strong.  Improve your blood circulation.  Help your body use oxygen better. This relieves symptoms such as fatigue and shortness of breath.  Help your mental health by lowering the risk of depression and other problems.  Improve your quality of life.  Decrease your chance of hospital admission for heart failure. What is an exercise plan? An exercise plan is a set of specific exercises and training activities. You will work with your health care provider to create the exercise plan that works for you. The plan may include:  Different types of exercises and how to do them.  Cardiac rehabilitation exercises. These are supervised programs that are designed to strengthen your heart. What are strengthening exercises? Strengthening exercises are a type of physical activity that involves  using resistance to improve your muscle strength. Strengthening exercises usually have repetitive motions. These types of exercises can include:  Lifting weights.  Using weight machines.  Using resistance tubes and bands.  Using kettlebells.  Using your body weight, such as doing push-ups or squats. What are balance exercises? Balance exercises are another type of physical activity. They strengthen the muscles of the back, stomach, and pelvis (core muscles) and improve your balance. They can also  lower your risk of falling. These types of exercises can include:  Standing on one leg.  Walking backward, sideways, and in a straight line.  Standing up after sitting, without using your hands.  Shifting your weight from one leg to the other.  Lifting one leg in front of you.  Doing tai chi. This is a type of exercise that uses slow movements and deep breathing. How can I increase my flexibility? Having better flexibility can keep you from falling. It can also lengthen your muscles, improve your range of motion, and help your joints. You can increase your flexibility by:  Doing tai chi.  Doing yoga.  Stretching. How much aerobic exercise should I get?  Aerobic exercises strengthen your breathing and circulation system and increase your body's use of oxygen. Examples of aerobic exercise include biking, walking, running, and swimming. Talk to your health care provider to find out how much aerobic exercise is safe for you.  To do these exercises:  Start exercising slowly, limiting the amount of time at first. You may need to start with 5 minutes of aerobic exercise every day.  Slowly add more minutes until you can safely do at least 30 minutes of exercise at least 4 days a week. Summary  Daily physical activity is important after a diagnosis of heart failure.  Exercise can make your heart muscles stronger. It also offers other benefits that will improve your health.  Talk to your health care provider before doing any exercises. This information is not intended to replace advice given to you by your health care provider. Make sure you discuss any questions you have with your health care provider. Document Released: 02/28/2017 Document Revised: 03/03/2017 Document Reviewed: 02/28/2017 Elsevier Patient Education  [place] ALCOHOL!!

## 2019-05-19 LAB — CULTURE, BLOOD (ROUTINE X 2)
Culture: NO GROWTH
Culture: NO GROWTH
Special Requests: ADEQUATE
Special Requests: ADEQUATE

## 2019-05-30 NOTE — Progress Notes (Deleted)
   Patient ID: Brandon Bolton, male    DOB: [birth year], 63 y.o.   MRN: [id number]  HPI  Brandon Bolton is a 63 y/o male with a history of  Echo report from 05/14/2019 reviewed and showed an EF of 30-35% along with mild/moderate Brandon and trivial AR.   Admitted 05/14/2019 due to acute heart failure. Cardiology consult obtained. Initially required bipap and then successfully weaned off. Discharged after 3 days.  He presents today for his initial visit with chief complaint of   Review of Systems    Physical Exam    Assessment & Plan:  1: Chronic heart failure with reduced ejection fraction- - NYHA class - BNP 05/14/2019 was 765.0  2: HTN- - BP - saw PCP at [facility] 11/27/2018 - BMP 05/17/2019 reviewed and showed sodium 135, potassium 4.1, creatinine 0.65 and GFR >60   3: DM- - A1c 05/15/2019 was 5.3  4: Substance abuse-

## 2019-05-31 ENCOUNTER — Other Ambulatory Visit: Payer: Self-pay | Admitting: Adult Health Nurse Practitioner

## 2019-05-31 DIAGNOSIS — J438 Other emphysema: Secondary | ICD-10-CM

## 2019-06-03 ENCOUNTER — Telehealth: Payer: Self-pay | Admitting: Family

## 2019-06-03 ENCOUNTER — Ambulatory Visit: Payer: Medicaid Other | Admitting: Family

## 2019-06-03 NOTE — Telephone Encounter (Signed)
Patient did not show for his [facility] Failure Clinic appointment on 06/03/2019. Will attempt to reschedule.

## 2019-06-13 ENCOUNTER — Telehealth: Payer: Self-pay | Admitting: Internal Medicine

## 2019-06-13 NOTE — Telephone Encounter (Signed)
Called patient for COVID-19 pre-screening for in office visit.  Have you recently traveled any where out of the local area in the last 2 weeks? no  Have you been in close contact with a person diagnosed with COVID-19 or someone awaiting results within the last 2 weeks? no  Do you currently have any of the following symptoms? No   If so, when did they start? Cough     Diarrhea   Joint Pain Fever      Muscle Pain   Red eyes Shortness of breath   Abdominal pain  Vomiting Loss of smell    Rash    Sore Throat Headache    Weakness   Bruising or bleeding   Okay to proceed with visit. 06/14/2019

## 2019-06-14 ENCOUNTER — Ambulatory Visit: Payer: Medicaid Other | Admitting: Internal Medicine

## 2019-06-14 ENCOUNTER — Encounter: Payer: Self-pay | Admitting: Internal Medicine

## 2019-06-14 ENCOUNTER — Other Ambulatory Visit: Payer: Self-pay

## 2019-06-14 VITALS — BP 124/60 | HR 82 | Temp 98.2°F | Ht 68.0 in | Wt 150.0 lb

## 2019-06-14 DIAGNOSIS — I5022 Chronic systolic (congestive) heart failure: Secondary | ICD-10-CM | POA: Diagnosis not present

## 2019-06-14 DIAGNOSIS — F17210 Nicotine dependence, cigarettes, uncomplicated: Secondary | ICD-10-CM

## 2019-06-14 DIAGNOSIS — J441 Chronic obstructive pulmonary disease with (acute) exacerbation: Secondary | ICD-10-CM

## 2019-06-14 NOTE — Patient Instructions (Signed)
Check PFTs Check 6MWT Check ONO  STOP SMOKING!!!  Follow up with Dr Ubaldo Glassing and [facility] Failure [facility]   Continue inhalers as prescribed

## 2019-06-14 NOTE — Progress Notes (Signed)
Name: Theadora Ramaerry L Krupinski MRN: [id number] DOB: 1955-11-14     CONSULTATION DATE: 06/14/2019 REFERRING MD : patel CHIEF COMPLAINT: SOB    HISTORY OF PRESENT ILLNESS: 63 y.o. male with a history of CHF, COPD, HTN, HLD, and T2DM admission  for SOB x 3-4 days, treated for pneumonia in May 2020.  Admitted for CHF/COPD exacerbation  Hospital admission- EKG shows  new LBBB as previous EKG not available accompanied with elevated troponins concerning for NSTEMI.  Previous Echo shows EF of 35%, repeat Echo during current admission shows EF of 30%.  -He initially required BiPAP but has since been tolerating 3L [place] well and using BiPAP PRN.  - Cardiology saw patient  made the recommendation for patient to be discharged on a BB, ACE/ ARB for his reduced systolic function at discharge.   - Discussed importance of smoking cessation and reduction in alcohol consumption.  Patient with some progressive shortness of breath and dyspnea exertion Still smokes Patient takes Symbicort and Spiriva States to be doing okay with this regimen  Patient was recently discharged from the hospital I have explained to him he will need pulmonary function testing 6-minute walk test and overnight pulse oximetry to assess for hypoxia  Patient is to follow-up with the CHF clinic   Smoking Assessment and Cessation Counseling   Upon further questioning, Patient smokes 1-2 ppd  I have advised patient to quit/stop smoking as soon as possible due to high risk for multiple medical problems  Patient  is NOT willing to quit smoking  I have advised patient that we can assist and have options of Nicotine replacement therapy. I also advised patient on behavioral therapy and can provide oral medication therapy in conjunction with the other therapies  Follow up next Office visit  for assessment of smoking cessation  Smoking cessation counseling advised for 4 minutes    SIGNIFICANT EVENTS 7/14 admitted for CHF and COPD  exacerbation,requiring biPAP 7/15 required biPAP 7/16 off biPAP, transfer to gen med floor -discharged home  PAST MEDICAL HISTORY :   has a past medical history of CHF (congestive heart failure) (HCC), COPD (chronic obstructive pulmonary disease) (HCC), Diabetes mellitus, type 2 (HCC), Drug abuse (HCC), Dupuytren's contracture, ETOH abuse (2018), Hypertension, and Mixed hyperlipidemia (01/2018).  has a past surgical history that includes Hand surgery. Prior to Admission medications   Medication Sig Start Date End Date Taking? Authorizing Provider  amLODipine (NORVASC) 10 MG tablet Take 1 tablet (10 mg total) by mouth daily. 03/05/19   Doles-Johnson, Teah, NP  carvedilol (COREG) 6.25 MG tablet Take 1 tablet (6.25 mg total) by mouth 2 (two) times daily. 05/17/19 05/16/20  Erin FullingKasa, Carlito Bogert, MD  cetirizine (ZYRTEC) 10 MG tablet Take 1 tablet by mouth once daily 04/01/19   Doles-Johnson, Teah, NP  diphenhydrAMINE (BENADRYL) 25 mg capsule Take 25 mg by mouth every 6 (six) hours as needed for itching, allergies or sleep.     [provider]  losartan (COZAAR) 25 MG tablet Take 1 tablet (25 mg total) by mouth daily. 05/17/19 05/16/20  Erin FullingKasa, Davi Kroon, MD  neomycin-polymyxin-hydrocortisone (CORTISPORIN) OTIC solution Place 4 drops into the left ear 4 (four) times daily. Patient not taking: Reported on 03/05/2019 11/27/18   Iloabachie, Chioma E, NP  nicotine (NICODERM CQ - DOSED IN MG/24 HOURS) 21 mg/24hr patch Place 1 patch (21 mg total) onto the skin daily. Patient not taking: Reported on 05/14/2019 03/12/19   Ramonita LabGouru, Aruna, MD  predniSONE (STERAPRED UNI-PAK 21 TAB) 10 MG (21) TBPK tablet  Take 1 tablet (10 mg total) by mouth daily. Take 6 tablets by mouth for 1 day followed by  5 tablets by mouth for 1 day followed by  4 tablets by mouth for 1 day followed by  3 tablets by mouth for 1 day followed by  2 tablets by mouth for 1 day followed by  1 tablet by mouth for a day and stop Patient not taking: Reported on  05/14/2019 03/11/19   Ramonita LabGouru, Aruna, MD  SYMBICORT 160-4.5 MCG/ACT inhaler Inhale 2 puffs into the lungs 2 (two) times daily. 03/05/19   Doles-Johnson, Teah, NP  tiotropium (SPIRIVA) 18 MCG inhalation capsule Place 18 mcg into inhaler and inhale daily.    [provider]  varenicline (CHANTIX) 0.5 MG tablet Take 0.5 mg by mouth 2 (two) times daily.    [provider]  VENTOLIN HFA 108 (90 Base) MCG/ACT inhaler INHALE 2 PUFFS BY MOUTH EVERY 6 HOURS AS NEEDED FOR WHEEZING OR SHORTNESS OF BREATH 03/11/19   Gouru, Deanna ArtisAruna, MD   No Known Allergies  FAMILY HISTORY:  family history includes Allergic Disorder in his mother; Arthritis in his father; Cancer in his father; Heart disease in his father; Hypertension in his father. SOCIAL HISTORY:  reports that he has been smoking cigarettes. He has a 40.00 pack-year smoking history. He has never used smokeless tobacco. He reports that he does not drink alcohol or use drugs.    Review of Systems:  Gen:  Denies  fever, sweats, chills weigh loss  HEENT: Denies blurred vision, double vision, ear pain, eye pain, hearing loss, nose bleeds, sore throat Cardiac:  No dizziness, chest pain or heaviness, chest tightness,edema, No JVD Resp:   No cough, -sputum production,  +shortness of breath,-wheezing, -hemoptysis,  Gi: Denies swallowing difficulty, stomach pain, nausea or vomiting, diarrhea, constipation, bowel incontinence Gu:  Denies bladder incontinence, burning urine Ext:   Denies Joint pain, stiffness or swelling Skin: Denies  skin rash, easy bruising or bleeding or hives Endoc:  Denies polyuria, polydipsia , polyphagia or weight change Psych:   Denies depression, insomnia or hallucinations  Other:  All other systems negative    BP 124/60 (BP Location: Left Arm, Cuff Size: Normal)   Pulse 82   Temp 98.2 F (36.8 C) (Temporal)   Ht 5\' 8"  (1.727 m)   Wt 150 lb (68 kg)   SpO2 94%   BMI 22.81 kg/m    Physical Examination:    GENERAL:NAD, no fevers, chills, no weakness no fatigue HEAD: Normocephalic, atraumatic.  EYES: PERLA, EOMI No scleral icterus.  NECK: Supple. No thyromegaly.  No JVD.  PULMONARY: CTA B/L + rhonchi CARDIOVASCULAR: S1 and S2. Regular rate and rhythm. No murmurs GASTROINTESTINAL: Soft, nontender, nondistended. Positive bowel sounds.  MUSCULOSKELETAL: No swelling, clubbing, or edema.  NEUROLOGIC: No gross focal neurological deficits. 5/5 strength all extremities SKIN: No ulceration, lesions, rashes, or cyanosis.  PSYCHIATRIC: Insight, judgment intact. -depression -anxiety ALL OTHER ROS ARE NEGATIVE   MEDICATIONS: I have reviewed all medications and confirmed regimen as documented  CBC    Component Value Date/Time   WBC 8.6 05/17/2019 0434   RBC 4.22 05/17/2019 0434   HGB 14.2 05/17/2019 0434   HGB 13.4 03/20/2018 1825   HCT 40.8 05/17/2019 0434   HCT 39.0 03/20/2018 1825   PLT 261 05/17/2019 0434   PLT 306 03/20/2018 1825   MCV 96.7 05/17/2019 0434   MCV 92 03/20/2018 1825   MCV 93 04/19/2013 2206   MCH 33.6 05/17/2019 0434  MCHC 34.8 05/17/2019 0434   RDW 13.7 05/17/2019 0434   RDW 14.3 03/20/2018 1825   RDW 14.7 (H) 04/19/2013 2206   LYMPHSABS 0.9 05/14/2019 0113   LYMPHSABS 2.8 08/25/2016 2054   MONOABS 0.7 05/14/2019 0113   EOSABS 0.0 05/14/2019 0113   EOSABS 0.2 08/25/2016 2054   BASOSABS 0.0 05/14/2019 0113   BASOSABS 0.0 08/25/2016 2054   BMP Latest Ref Rng & Units 05/17/2019 05/16/2019 05/15/2019  Glucose 70 - 99 mg/dL 133(H) 137(H) 169(H)  BUN 8 - 23 mg/dL 18 17 18   Creatinine 0.61 - 1.24 mg/dL 0.65 0.76 0.68  BUN/Creat Ratio 10 - 24 - - -  Sodium 135 - 145 mmol/L 135 130(L) 126(L)  Potassium 3.5 - 5.1 mmol/L 4.1 3.5 3.6  Chloride 98 - 111 mmol/L 93(L) 90(L) 91(L)  CO2 22 - 32 mmol/L 30 29 24   Calcium 8.9 - 10.3 mg/dL 9.4 8.8(L) 8.2(L)     COVID-19 NEGATIVE: Acute COVID-19 infection ruled out by PCR.     IMAGING    X-Gaetano from 05/20/2019  reviewed by me  today Bilateral interstitial infiltrates Findings consistent with pulmonary edema  ASSESSMENT AND PLAN SYNOPSIS  63 year old white male seen today for recent admission for acute CHF exacerbation in the setting of underlying COPD with ongoing tobacco abuse in the setting of deconditioned state   SOB/ dyspnea exertion most likely related to progression of underlying COPD, and deconditioned state Patient will need to be assessed for exertional hypoxia with 6-minute walk test Patient will need also be assessed for nocturnal hypoxia with overnight pulse oximetry  Smoking Cessation strongly advised  COPD Patient will need pulmonary function testing Continue inhalers as prescribed with Symbicort and Spiriva Albuterol as needed  Severe CHF Patient is to follow-up with Dr. Ubaldo Glassing cardiology Lasix as tolerated  COVID-19 EDUCATION: The signs and symptoms of COVID-19 were discussed with the patient and how to seek care for testing.  The importance of social distancing was discussed today. Hand Washing Techniques and avoid touching face was advised.  MEDICATION ADJUSTMENTS/LABS AND TESTS ORDERED: PFT's 6MWT ONO   CURRENT MEDICATIONS REVIEWED AT LENGTH WITH PATIENT TODAY   Patient satisfied with Plan of action and management. All questions answered  Follow up in 3 months   Timothy Trudell Patricia Pesa, M.D.  [facility]  Medical Director [place] Director [facility] Cardio-Pulmonary Department

## 2019-06-21 ENCOUNTER — Encounter: Payer: Self-pay | Admitting: Internal Medicine

## 2019-07-02 ENCOUNTER — Telehealth: Payer: Self-pay | Admitting: Internal Medicine

## 2019-07-02 DIAGNOSIS — J449 Chronic obstructive pulmonary disease, unspecified: Secondary | ICD-10-CM

## 2019-07-02 NOTE — Telephone Encounter (Signed)
ONO reviewed by DR. Kasa- recommend 1L [place]. Left message to make pt aware of results.

## 2019-07-03 NOTE — Telephone Encounter (Signed)
Pt is aware of results and voiced his understanding. Order has been placed for night time oxygen, as pt wished to proceed. Nothing further is needed.

## 2019-07-10 NOTE — Progress Notes (Deleted)
   Patient ID: Brandon Bolton, male    DOB: [birth year], 63 y.o.   MRN: [id number]  HPI  Brandon Bolton is a 63 y/o male with a history of  Echo report from 05/14/2019 reviewed and showed an EF of 30-35% along with mild/moderate Brandon and mild AR.   Admitted 05/14/2019 due to HF exacerbation. Cardiology consult obtained. Initially needed bipap but then able to be weaned to nasal cannula. Discharged after 3 days.   He presents today for his initial visit with a chief complaint of   Review of Systems    Physical Exam  Assessment & Plan:  1: Chronic heart failure with reduced ejection fraction- - NYHA class - saw cardiology Minette Brine) 06/07/2019 - BNP 05/14/2019 was 765.0  2: HTN- - BP - saw PCP at [facility] 11/27/2018 - BMP 05/17/2019 reviewed and showed sodium 135, potassium 4.1, creatinine 0.65 and GFR >60  3: COPD- - saw pulmonology (Kasa) 06/17/2019  4: DM-  - A1c 05/15/2019 was 5.3%

## 2019-07-12 ENCOUNTER — Ambulatory Visit: Payer: Medicaid Other | Admitting: Family

## 2019-07-12 ENCOUNTER — Telehealth: Payer: Self-pay | Admitting: Family

## 2019-07-12 NOTE — Telephone Encounter (Signed)
Patient did not show for his Heart Failure Clinic appointment on 07/12/2019. Will attempt to reschedule.  

## 2019-07-19 ENCOUNTER — Ambulatory Visit: Payer: Medicaid Other

## 2019-08-29 ENCOUNTER — Other Ambulatory Visit: Payer: Self-pay | Admitting: Internal Medicine

## 2019-08-29 DIAGNOSIS — J438 Other emphysema: Secondary | ICD-10-CM

## 2019-09-05 ENCOUNTER — Ambulatory Visit: Payer: Medicaid Other

## 2019-10-02 ENCOUNTER — Ambulatory Visit (INDEPENDENT_AMBULATORY_CARE_PROVIDER_SITE_OTHER): Payer: Medicaid Other | Admitting: Internal Medicine

## 2019-10-02 ENCOUNTER — Encounter: Payer: Self-pay | Admitting: Internal Medicine

## 2019-10-02 DIAGNOSIS — J9611 Chronic respiratory failure with hypoxia: Secondary | ICD-10-CM | POA: Diagnosis not present

## 2019-10-02 DIAGNOSIS — I5022 Chronic systolic (congestive) heart failure: Secondary | ICD-10-CM | POA: Diagnosis not present

## 2019-10-02 DIAGNOSIS — Z720 Tobacco use: Secondary | ICD-10-CM

## 2019-10-02 DIAGNOSIS — J449 Chronic obstructive pulmonary disease, unspecified: Secondary | ICD-10-CM | POA: Diagnosis not present

## 2019-10-02 MED ORDER — CHANTIX STARTING MONTH PAK 0.5 MG X 11 & 1 MG X 42 PO TABS: ORAL_TABLET | ORAL | 0 refills | Status: AC

## 2019-10-02 NOTE — Patient Instructions (Signed)
Continue inhalers as prescribed Strongly recommend smoking cessation Strongly recommend using oxygen as prescribed Follow-up with cardiology as scheduled

## 2019-10-02 NOTE — Progress Notes (Signed)
Name: Brandon Bolton MRN: [id number] DOB: 1956-05-06     CONSULTATION DATE: 10/02/2019 REFERRING MD : patel   I connected with the patient by telephone enabled telemedicine visit and verified that I am speaking with the correct person using two identifiers.    I discussed the limitations, risks, security and privacy concerns of performing an evaluation and management service by telemedicine and the availability of in-person appointments. I also discussed with the patient that there may be a patient responsible charge related to this service. The patient expressed understanding and agreed to proceed.  PATIENT AGREES AND CONFIRMS -YES   Other persons participating in the visit and their role in the encounter: Patient, nursing  This visit type was conducted due to national recommendations for restrictions regarding the COVID-19 Pandemic (e.g. social distancing).  This format is felt to be most appropriate for this patient at this time.  All issues noted in this document were discussed and addressed.       SYNOPSIS  63 y.o. male with a history of CHF, COPD, HTN, HLD, and T2DM admission  for SOB x 3-4 days, treated for pneumonia in May 2020.  Admitted for CHF/COPD exacerbation  Hospital admission- EKG shows  new LBBB as previous EKG not available accompanied with elevated troponins concerning for NSTEMI.  Previous Echo shows EF of 35%, repeat Echo during current admission shows EF of 30%.  -He initially required BiPAP but has since been tolerating 3L [place] well and using BiPAP PRN.  - Cardiology saw patient  made the recommendation for patient to be discharged on a BB, ACE/ ARB for his reduced systolic function at discharge.   - Discussed importance of smoking cessation and reduction in alcohol consumption.  Patient with some progressive shortness of breath and dyspnea exertion Still smokes Patient takes Symbicort and Spiriva  Patient was recently discharged from the hospital I have  explained to him he will need pulmonary function testing 6-minute walk test and overnight pulse oximetry to assess for hypoxia  Patient is to follow-up with the CHF clinic   CC  follow COPD  HPI  No COPD exacerbation at this time No evidence of heart failure at this time No evidence or signs of infection at this time No respiratory distress No fevers, chills, nausea, vomiting, diarrhea No evidence of lower extremity edema No evidence hemoptysis  On oxygen at night +ONO Does NOT use often  PATIENT DID NOT OBTAIN PFT, 6WMT  Smoking Assessment and Cessation Counseling Upon further questioning, Patient smokes 1 PPD I have advised patient to quit/stop smoking as soon as possible due to high risk for multiple medical problems Patient  is  willing to quit smoking I have advised patient that we can assist and have options of Nicotine replacement therapy. I also advised patient on behavioral therapy and can provide oral medication therapy in conjunction with the other therapies Follow up next Office visit  for assessment of smoking cessation Smoking cessation counseling advised for 4 minutes     SIGNIFICANT EVENTS 7/14 admitted for CHF and COPD exacerbation,requiring biPAP 7/15 required biPAP 7/16 off biPAP, transfer to gen med floor -discharged home  PAST MEDICAL HISTORY :   has a past medical history of CHF (congestive heart failure) (HCC), COPD (chronic obstructive pulmonary disease) (HCC), Diabetes mellitus, type 2 (HCC), Drug abuse (HCC), Dupuytren's contracture, ETOH abuse (2018), Hypertension, and Mixed hyperlipidemia (01/2018).  has a past surgical history that includes Hand surgery. Prior to Admission medications   Medication Sig  Start Date End Date Taking? Authorizing Provider  amLODipine (NORVASC) 10 MG tablet Take 1 tablet (10 mg total) by mouth daily. 03/05/19   Doles-Johnson, Teah, NP  carvedilol (COREG) 6.25 MG tablet Take 1 tablet (6.25 mg total) by mouth 2 (two)  times daily. 05/17/19 05/16/20  Flora Lipps, MD  cetirizine (ZYRTEC) 10 MG tablet Take 1 tablet by mouth once daily 04/01/19   Doles-Johnson, Teah, NP  diphenhydrAMINE (BENADRYL) 25 mg capsule Take 25 mg by mouth every 6 (six) hours as needed for itching, allergies or sleep.     [provider]  losartan (COZAAR) 25 MG tablet Take 1 tablet (25 mg total) by mouth daily. 05/17/19 05/16/20  Flora Lipps, MD  neomycin-polymyxin-hydrocortisone (CORTISPORIN) OTIC solution Place 4 drops into the left ear 4 (four) times daily. Patient not taking: Reported on 03/05/2019 11/27/18   Iloabachie, Chioma E, NP  nicotine (NICODERM CQ - DOSED IN MG/24 HOURS) 21 mg/24hr patch Place 1 patch (21 mg total) onto the skin daily. Patient not taking: Reported on 05/14/2019 03/12/19   Nicholes Mango, MD  predniSONE (STERAPRED UNI-PAK 21 TAB) 10 MG (21) TBPK tablet Take 1 tablet (10 mg total) by mouth daily. Take 6 tablets by mouth for 1 day followed by  5 tablets by mouth for 1 day followed by  4 tablets by mouth for 1 day followed by  3 tablets by mouth for 1 day followed by  2 tablets by mouth for 1 day followed by  1 tablet by mouth for a day and stop Patient not taking: Reported on 05/14/2019 03/11/19   Nicholes Mango, MD  SYMBICORT 160-4.5 MCG/ACT inhaler Inhale 2 puffs into the lungs 2 (two) times daily. 03/05/19   Doles-Johnson, Teah, NP  tiotropium (SPIRIVA) 18 MCG inhalation capsule Place 18 mcg into inhaler and inhale daily.    [provider]  varenicline (CHANTIX) 0.5 MG tablet Take 0.5 mg by mouth 2 (two) times daily.    [provider]  VENTOLIN HFA 108 (90 Base) MCG/ACT inhaler INHALE 2 PUFFS BY MOUTH EVERY 6 HOURS AS NEEDED FOR WHEEZING OR SHORTNESS OF BREATH 03/11/19   Nicholes Mango, MD     Review of Systems:  Gen:  Denies  fever, sweats, chills weight loss  HEENT: Denies blurred vision, double vision, ear pain, eye pain, hearing loss, nose bleeds, sore throat Cardiac:  No dizziness, chest  pain or heaviness, chest tightness,edema, No JVD Resp:   No cough, -sputum production, +shortness of breath,-wheezing, -hemoptysis,  Gi: Denies swallowing difficulty, stomach pain, nausea or vomiting, diarrhea, constipation, bowel incontinence Gu:  Denies bladder incontinence, burning urine Ext:   Denies Joint pain, stiffness or swelling Skin: Denies  skin rash, easy bruising or bleeding or hives Endoc:  Denies polyuria, polydipsia , polyphagia or weight change Psych:   Denies depression, insomnia or hallucinations  Other:  All other systems negative    MEDICATIONS: I have reviewed all medications and confirmed regimen as documented     COVID-19 NEGATIVE: Acute COVID-19 infection ruled out by PCR.     IMAGING    X-[place] from 05/20/2019  reviewed by me today Bilateral interstitial infiltrates Findings consistent with pulmonary edema      ASSESSMENT AND PLAN SYNOPSIS  63 year old white male assessment for COPD tobacco abuse and CHF Patient with chronic shortness of breath and deconditioned state  Shortness of breath and dyspnea exertion related to progression of COPD tobacco abuse deconditioned state and CHF At this time 6-minute walk test is pending  Patient had overnight pulse oximetry consistent with hypoxia  Chronic hypoxic respiratory failure from COPD and CHF Patient has been prescribed oxygen but does not use it often I have strongly advised patient to use oxygen as prescribed otherwise he is at risk for cardiac arrest and death  COPD seems to be moderate to severe Chronic shortness of breath and dyspnea exertion is stable Patient states that Spiriva is helping a lot Patient is to continue Symbicort as prescribed I have advised patient to rinse mouth after every use Pulmonary function testing is pending Albuterol as needed   Smoking cessation strongly advised Chantix ordered  Severe CHF Continue Lasix as tolerated Follow-up with Dr. Lady Gary cardiology    COVID-19 EDUCATION: The signs and symptoms of COVID-19 were discussed with the patient and how to seek care for testing.  The importance of social distancing was discussed today. Hand Washing Techniques and avoid touching face was advised.  MEDICATION ADJUSTMENTS/LABS AND TESTS ORDERED: Continue spine function testing as well as 6-minute walk test  CURRENT MEDICATIONS REVIEWED AT LENGTH WITH PATIENT TODAY   Patient satisfied with Plan of action and management. All questions answered  Follow up in 6 months  Total Time Spent 22 mins   Wallis Bamberg Santiago Glad, M.D.  [facility]  Medical Director [facility] Medical Director [facility] Cardio-Pulmonary Department

## 2019-10-30 ENCOUNTER — Telehealth: Payer: Self-pay | Admitting: Internal Medicine

## 2019-10-30 NOTE — Telephone Encounter (Signed)
Pt is aware of date/time of covid test.   

## 2019-11-04 ENCOUNTER — Other Ambulatory Visit
Admission: RE | Admit: 2019-11-04 | Discharge: 2019-11-04 | Disposition: A | Discharge status: Home / Self Care | Payer: Medicaid Other | Source: Ambulatory Visit | Attending: Internal Medicine | Admitting: Internal Medicine

## 2019-11-04 DIAGNOSIS — Z01812 Encounter for preprocedural laboratory examination: Secondary | ICD-10-CM | POA: Insufficient documentation

## 2019-11-04 DIAGNOSIS — Z20822 Contact with and (suspected) exposure to covid-19: Secondary | ICD-10-CM | POA: Diagnosis not present

## 2019-11-04 LAB — SARS CORONAVIRUS 2 (TAT 6-24 HRS): SARS Coronavirus 2: NEGATIVE

## 2019-11-05 ENCOUNTER — Other Ambulatory Visit: Payer: Self-pay

## 2019-11-05 ENCOUNTER — Ambulatory Visit: Payer: Medicaid Other | Attending: Internal Medicine

## 2019-11-05 DIAGNOSIS — J441 Chronic obstructive pulmonary disease with (acute) exacerbation: Secondary | ICD-10-CM

## 2019-11-05 DIAGNOSIS — I5022 Chronic systolic (congestive) heart failure: Secondary | ICD-10-CM | POA: Diagnosis not present

## 2019-11-05 MED ORDER — ALBUTEROL SULFATE (2.5 MG/3ML) 0.083% IN NEBU
2.5000 mg | INHALATION_SOLUTION | Freq: Once | RESPIRATORY_TRACT | Status: AC
  Administered 2019-11-05: 11:00:00 2.5 mg via RESPIRATORY_TRACT
  Filled 2019-11-05: qty 3

## 2020-01-29 ENCOUNTER — Other Ambulatory Visit: Payer: Self-pay | Admitting: Gerontology

## 2020-01-29 DIAGNOSIS — I10 Essential (primary) hypertension: Secondary | ICD-10-CM

## 2020-02-03 ENCOUNTER — Other Ambulatory Visit: Payer: Self-pay

## 2020-02-03 ENCOUNTER — Emergency Department: Payer: Medicaid Other

## 2020-02-03 ENCOUNTER — Inpatient Hospital Stay: Payer: Medicaid Other

## 2020-02-03 ENCOUNTER — Encounter: Payer: Self-pay | Admitting: Emergency Medicine

## 2020-02-03 ENCOUNTER — Emergency Department
Admission: EM | Admit: 2020-02-03 | Discharge: 2020-02-03 | Disposition: A | Discharge status: Still In Hospital | Payer: Medicaid Other | Attending: Emergency Medicine | Admitting: Emergency Medicine

## 2020-02-03 DIAGNOSIS — R578 Other shock: Secondary | ICD-10-CM | POA: Diagnosis present

## 2020-02-03 DIAGNOSIS — E872 Acidosis: Secondary | ICD-10-CM | POA: Diagnosis present

## 2020-02-03 DIAGNOSIS — Z20822 Contact with and (suspected) exposure to covid-19: Secondary | ICD-10-CM | POA: Diagnosis present

## 2020-02-03 DIAGNOSIS — I214 Non-ST elevation (NSTEMI) myocardial infarction: Secondary | ICD-10-CM | POA: Diagnosis present

## 2020-02-03 DIAGNOSIS — E119 Type 2 diabetes mellitus without complications: Secondary | ICD-10-CM | POA: Diagnosis present

## 2020-02-03 DIAGNOSIS — I469 Cardiac arrest, cause unspecified: Secondary | ICD-10-CM | POA: Diagnosis present

## 2020-02-03 DIAGNOSIS — Z9911 Dependence on respirator [ventilator] status: Secondary | ICD-10-CM | POA: Diagnosis not present

## 2020-02-03 DIAGNOSIS — Z79899 Other long term (current) drug therapy: Secondary | ICD-10-CM | POA: Diagnosis not present

## 2020-02-03 DIAGNOSIS — J9601 Acute respiratory failure with hypoxia: Secondary | ICD-10-CM | POA: Diagnosis present

## 2020-02-03 DIAGNOSIS — R404 Transient alteration of awareness: Secondary | ICD-10-CM | POA: Diagnosis present

## 2020-02-03 DIAGNOSIS — Z7189 Other specified counseling: Secondary | ICD-10-CM | POA: Diagnosis not present

## 2020-02-03 DIAGNOSIS — G931 Anoxic brain damage, not elsewhere classified: Secondary | ICD-10-CM | POA: Diagnosis not present

## 2020-02-03 DIAGNOSIS — Z8249 Family history of ischemic heart disease and other diseases of the circulatory system: Secondary | ICD-10-CM | POA: Diagnosis not present

## 2020-02-03 DIAGNOSIS — Y902 Blood alcohol level of 40-59 mg/100 ml: Secondary | ICD-10-CM | POA: Diagnosis present

## 2020-02-03 DIAGNOSIS — W109XXA Fall (on) (from) unspecified stairs and steps, initial encounter: Secondary | ICD-10-CM | POA: Diagnosis present

## 2020-02-03 DIAGNOSIS — J9602 Acute respiratory failure with hypercapnia: Secondary | ICD-10-CM | POA: Diagnosis present

## 2020-02-03 DIAGNOSIS — E782 Mixed hyperlipidemia: Secondary | ICD-10-CM | POA: Diagnosis present

## 2020-02-03 DIAGNOSIS — T68XXXA Hypothermia, initial encounter: Secondary | ICD-10-CM | POA: Diagnosis present

## 2020-02-03 DIAGNOSIS — E871 Hypo-osmolality and hyponatremia: Secondary | ICD-10-CM | POA: Diagnosis present

## 2020-02-03 DIAGNOSIS — I255 Ischemic cardiomyopathy: Secondary | ICD-10-CM | POA: Diagnosis present

## 2020-02-03 DIAGNOSIS — I447 Left bundle-branch block, unspecified: Secondary | ICD-10-CM | POA: Diagnosis present

## 2020-02-03 DIAGNOSIS — R230 Cyanosis: Secondary | ICD-10-CM | POA: Diagnosis present

## 2020-02-03 DIAGNOSIS — J441 Chronic obstructive pulmonary disease with (acute) exacerbation: Secondary | ICD-10-CM

## 2020-02-03 DIAGNOSIS — I5023 Acute on chronic systolic (congestive) heart failure: Secondary | ICD-10-CM | POA: Diagnosis present

## 2020-02-03 DIAGNOSIS — N179 Acute kidney failure, unspecified: Secondary | ICD-10-CM | POA: Diagnosis present

## 2020-02-03 DIAGNOSIS — M720 Palmar fascial fibromatosis [Dupuytren]: Secondary | ICD-10-CM | POA: Diagnosis present

## 2020-02-03 DIAGNOSIS — Z66 Do not resuscitate: Secondary | ICD-10-CM | POA: Diagnosis not present

## 2020-02-03 DIAGNOSIS — E875 Hyperkalemia: Secondary | ICD-10-CM | POA: Diagnosis present

## 2020-02-03 DIAGNOSIS — Z515 Encounter for palliative care: Secondary | ICD-10-CM | POA: Diagnosis not present

## 2020-02-03 DIAGNOSIS — I426 Alcoholic cardiomyopathy: Secondary | ICD-10-CM | POA: Diagnosis present

## 2020-02-03 DIAGNOSIS — F17210 Nicotine dependence, cigarettes, uncomplicated: Secondary | ICD-10-CM | POA: Diagnosis present

## 2020-02-03 DIAGNOSIS — Z7951 Long term (current) use of inhaled steroids: Secondary | ICD-10-CM

## 2020-02-03 DIAGNOSIS — I351 Nonrheumatic aortic (valve) insufficiency: Secondary | ICD-10-CM | POA: Diagnosis not present

## 2020-02-03 DIAGNOSIS — F1910 Other psychoactive substance abuse, uncomplicated: Secondary | ICD-10-CM | POA: Diagnosis present

## 2020-02-03 DIAGNOSIS — I110 Hypertensive heart disease with heart failure: Secondary | ICD-10-CM | POA: Diagnosis present

## 2020-02-03 DIAGNOSIS — R092 Respiratory arrest: Secondary | ICD-10-CM

## 2020-02-03 DIAGNOSIS — Z8261 Family history of arthritis: Secondary | ICD-10-CM

## 2020-02-03 DIAGNOSIS — I340 Nonrheumatic mitral (valve) insufficiency: Secondary | ICD-10-CM | POA: Diagnosis not present

## 2020-02-03 DIAGNOSIS — F1020 Alcohol dependence, uncomplicated: Secondary | ICD-10-CM | POA: Diagnosis present

## 2020-02-03 LAB — CBC WITH DIFFERENTIAL/PLATELET
Abs Immature Granulocytes: 0.54 10*3/uL — ABNORMAL HIGH (ref 0.00–0.07)
Basophils Absolute: 0.1 10*3/uL (ref 0.0–0.1)
Basophils Relative: 1 %
Eosinophils Absolute: 0.1 10*3/uL (ref 0.0–0.5)
Eosinophils Relative: 1 %
HCT: 41.4 % (ref 39.0–52.0)
Hemoglobin: 13.8 g/dL (ref 13.0–17.0)
Immature Granulocytes: 7 %
Lymphocytes Relative: 57 %
Lymphs Abs: 4.7 10*3/uL — ABNORMAL HIGH (ref 0.7–4.0)
MCH: 33.1 pg (ref 26.0–34.0)
MCHC: 33.3 g/dL (ref 30.0–36.0)
MCV: 99.3 fL (ref 80.0–100.0)
Monocytes Absolute: 0.5 10*3/uL (ref 0.1–1.0)
Monocytes Relative: 6 %
Neutro Abs: 2.2 10*3/uL (ref 1.7–7.7)
Neutrophils Relative %: 28 %
Platelets: 196 10*3/uL (ref 150–400)
RBC: 4.17 MIL/uL — ABNORMAL LOW (ref 4.22–5.81)
RDW: 13.2 % (ref 11.5–15.5)
WBC: 8.1 10*3/uL (ref 4.0–10.5)
nRBC: 0.4 % — ABNORMAL HIGH (ref 0.0–0.2)

## 2020-02-03 LAB — URINE DRUG SCREEN, QUALITATIVE (ARMC ONLY)
Amphetamines, Ur Screen: NOT DETECTED
Barbiturates, Ur Screen: NOT DETECTED
Benzodiazepine, Ur Scrn: NOT DETECTED
Cannabinoid 50 Ng, Ur ~~LOC~~: NOT DETECTED
Cocaine Metabolite,Ur ~~LOC~~: NOT DETECTED
MDMA (Ecstasy)Ur Screen: NOT DETECTED
Methadone Scn, Ur: NOT DETECTED
Opiate, Ur Screen: NOT DETECTED
Phencyclidine (PCP) Ur S: NOT DETECTED
Tricyclic, Ur Screen: NOT DETECTED

## 2020-02-03 LAB — COMPREHENSIVE METABOLIC PANEL
ALT: 64 U/L — ABNORMAL HIGH (ref 0–44)
AST: 93 U/L — ABNORMAL HIGH (ref 15–41)
Albumin: 3.3 g/dL — ABNORMAL LOW (ref 3.5–5.0)
Alkaline Phosphatase: 69 U/L (ref 38–126)
Anion gap: 16 — ABNORMAL HIGH (ref 5–15)
BUN: 9 mg/dL (ref 8–23)
CO2: 19 mmol/L — ABNORMAL LOW (ref 22–32)
Calcium: 8.2 mg/dL — ABNORMAL LOW (ref 8.9–10.3)
Chloride: 90 mmol/L — ABNORMAL LOW (ref 98–111)
Creatinine, Ser: 1.5 mg/dL — ABNORMAL HIGH (ref 0.61–1.24)
GFR calc Af Amer: 57 mL/min — ABNORMAL LOW (ref >60)
GFR calc non Af Amer: 49 mL/min — ABNORMAL LOW (ref >60)
Glucose, Bld: 366 mg/dL — ABNORMAL HIGH (ref 70–99)
Potassium: 5.5 mmol/L — ABNORMAL HIGH (ref 3.5–5.1)
Sodium: 125 mmol/L — ABNORMAL LOW (ref 135–145)
Total Bilirubin: 0.7 mg/dL (ref 0.3–1.2)
Total Protein: 5.8 g/dL — ABNORMAL LOW (ref 6.5–8.1)

## 2020-02-03 LAB — BLOOD GAS, ARTERIAL
Acid-base deficit: 20.5 mmol/L — ABNORMAL HIGH (ref 0.0–2.0)
Bicarbonate: 13.1 mmol/L — ABNORMAL LOW (ref 20.0–28.0)
FIO2: 100
MECHVT: 500 mL
Mechanical Rate: 18
O2 Saturation: 99.8 %
PEEP: 5 cmH2O
Patient temperature: 37
pCO2 arterial: 67 mmHg (ref 32.0–48.0)
pH, Arterial: 6.9 — CL (ref 7.350–7.450)
pO2, Arterial: 343 mmHg — ABNORMAL HIGH (ref 83.0–108.0)

## 2020-02-03 LAB — MAGNESIUM: Magnesium: 2.4 mg/dL (ref 1.7–2.4)

## 2020-02-03 LAB — URINALYSIS, COMPLETE (UACMP) WITH MICROSCOPIC
Bilirubin Urine: NEGATIVE
Glucose, UA: 50 mg/dL — AB
Hgb urine dipstick: NEGATIVE
Ketones, ur: NEGATIVE mg/dL
Leukocytes,Ua: NEGATIVE
Nitrite: NEGATIVE
Protein, ur: NEGATIVE mg/dL
Specific Gravity, Urine: 1.012 (ref 1.005–1.030)
Squamous Epithelial / HPF: NONE SEEN (ref 0–5)
WBC, UA: NONE SEEN WBC/hpf (ref 0–5)
pH: 5 (ref 5.0–8.0)

## 2020-02-03 LAB — TROPONIN I (HIGH SENSITIVITY)
Troponin I (High Sensitivity): 38 ng/L — ABNORMAL HIGH (ref <18)
Troponin I (High Sensitivity): 3472 ng/L (ref <18)

## 2020-02-03 LAB — HEMOGLOBIN A1C
Hgb A1c MFr Bld: 5.7 % — ABNORMAL HIGH (ref 4.8–5.6)
Mean Plasma Glucose: 116.89 mg/dL

## 2020-02-03 LAB — RESPIRATORY PANEL BY RT PCR (FLU A&B, COVID)
Influenza A by PCR: NEGATIVE
Influenza B by PCR: NEGATIVE
SARS Coronavirus 2 by RT PCR: NEGATIVE

## 2020-02-03 LAB — LACTIC ACID, PLASMA
Lactic Acid, Venous: 9 mmol/L (ref 0.5–1.9)
Lactic Acid, Venous: 5.7 mmol/L (ref 0.5–1.9)

## 2020-02-03 LAB — ETHANOL: Alcohol, Ethyl (B): 47 mg/dL — ABNORMAL HIGH (ref <10)

## 2020-02-03 LAB — POC SARS CORONAVIRUS 2 AG: SARS Coronavirus 2 Ag: NEGATIVE

## 2020-02-03 LAB — BRAIN NATRIURETIC PEPTIDE: B Natriuretic Peptide: 292 pg/mL — ABNORMAL HIGH (ref 0.0–100.0)

## 2020-02-03 MED ORDER — ADULT MULTIVITAMIN W/MINERALS CH: 1.0000 | ORAL_TABLET | Freq: Every day | ORAL | Status: DC

## 2020-02-03 MED ORDER — FENTANYL CITRATE (PF) 100 MCG/2ML IJ SOLN
50.0000 ug | Freq: Once | INTRAMUSCULAR | Status: AC
  Administered 2020-02-03: 50 ug via INTRAVENOUS
  Filled 2020-02-03: qty 2

## 2020-02-03 MED ORDER — SODIUM CHLORIDE 0.9 % IV BOLUS
1000.0000 mL | Freq: Once | INTRAVENOUS | Status: AC
  Administered 2020-02-03: 1000 mL via INTRAVENOUS

## 2020-02-03 MED ORDER — ACETAMINOPHEN 325 MG PO TABS
650.0000 mg | ORAL_TABLET | ORAL | Status: DC | PRN
  Administered 2020-02-04: 650 mg via ORAL
  Filled 2020-02-03: qty 2

## 2020-02-03 MED ORDER — EPINEPHRINE 1 MG/10ML IJ SOSY
PREFILLED_SYRINGE | INTRAMUSCULAR | Status: AC | PRN
  Administered 2020-02-03: 1 via INTRAVENOUS

## 2020-02-03 MED ORDER — EPINEPHRINE 1 MG/10ML IJ SOSY
PREFILLED_SYRINGE | INTRAMUSCULAR | Status: DC | PRN
  Administered 2020-02-03: 1 mg via INTRAVENOUS

## 2020-02-03 MED ORDER — IPRATROPIUM-ALBUTEROL 0.5-2.5 (3) MG/3ML IN SOLN
3.0000 mL | Freq: Once | RESPIRATORY_TRACT | Status: AC
  Administered 2020-02-03: 3 mL via RESPIRATORY_TRACT
  Filled 2020-02-03: qty 3

## 2020-02-03 MED ORDER — METHYLPREDNISOLONE SODIUM SUCC 125 MG IJ SOLR
60.0000 mg | Freq: Four times a day (QID) | INTRAMUSCULAR | Status: DC
  Administered 2020-02-04 (×2): 60 mg via INTRAVENOUS
  Filled 2020-02-03 (×2): qty 2

## 2020-02-03 MED ORDER — MIDAZOLAM HCL 2 MG/2ML IJ SOLN
2.0000 mg | INTRAMUSCULAR | Status: DC | PRN
  Administered 2020-02-03: 2 mg via INTRAVENOUS
  Filled 2020-02-03: qty 2

## 2020-02-03 MED ORDER — SODIUM CHLORIDE 0.9 % IV SOLN
500.0000 mg | Freq: Once | INTRAVENOUS | Status: AC
  Administered 2020-02-04: 500 mg via INTRAVENOUS
  Filled 2020-02-03: qty 500

## 2020-02-03 MED ORDER — THIAMINE HCL 100 MG PO TABS
100.0000 mg | ORAL_TABLET | Freq: Every day | ORAL | Status: DC
  Filled 2020-02-03: qty 1

## 2020-02-03 MED ORDER — SODIUM BICARBONATE 8.4 % IV SOLN
INTRAVENOUS | Status: AC | PRN
  Administered 2020-02-03: 50 meq via INTRAVENOUS

## 2020-02-03 MED ORDER — DEXMEDETOMIDINE HCL IN NACL 400 MCG/100ML IV SOLN
0.0000 ug/kg/h | INTRAVENOUS | Status: DC
  Administered 2020-02-03: 0.4 ug/kg/h via INTRAVENOUS
  Filled 2020-02-03: qty 100

## 2020-02-03 MED ORDER — ETOMIDATE 2 MG/ML IV SOLN
INTRAVENOUS | Status: AC | PRN
  Administered 2020-02-03: 15 mg via INTRAVENOUS

## 2020-02-03 MED ORDER — SODIUM CHLORIDE 0.9 % IV SOLN
80.0000 mg | Freq: Once | INTRAVENOUS | Status: AC
  Administered 2020-02-03: 80 mg via INTRAVENOUS
  Filled 2020-02-03: qty 80

## 2020-02-03 MED ORDER — IPRATROPIUM-ALBUTEROL 0.5-2.5 (3) MG/3ML IN SOLN
RESPIRATORY_TRACT | Status: AC
  Filled 2020-02-03: qty 9

## 2020-02-03 MED ORDER — HEPARIN SODIUM (PORCINE) 5000 UNIT/ML IJ SOLN
5000.0000 [IU] | Freq: Three times a day (TID) | INTRAMUSCULAR | Status: DC
  Administered 2020-02-03: 5000 [IU] via SUBCUTANEOUS
  Filled 2020-02-03 (×2): qty 1

## 2020-02-03 MED ORDER — IPRATROPIUM-ALBUTEROL 0.5-2.5 (3) MG/3ML IN SOLN
3.0000 mL | Freq: Four times a day (QID) | RESPIRATORY_TRACT | Status: DC
  Administered 2020-02-04 (×2): 3 mL via RESPIRATORY_TRACT
  Filled 2020-02-03 (×3): qty 3

## 2020-02-03 MED ORDER — SODIUM CHLORIDE 0.9 % IV SOLN
1.0000 g | INTRAVENOUS | Status: DC
  Filled 2020-02-03: qty 10

## 2020-02-03 MED ORDER — FOLIC ACID 1 MG PO TABS: 1.0000 mg | ORAL_TABLET | Freq: Every day | ORAL | Status: DC

## 2020-02-03 MED ORDER — NOREPINEPHRINE 4 MG/250ML-% IV SOLN
0.0000 ug/min | INTRAVENOUS | Status: DC
  Administered 2020-02-03: 30 ug/min via INTRAVENOUS
  Administered 2020-02-03 – 2020-02-04 (×2): 10 ug/min via INTRAVENOUS
  Filled 2020-02-03 (×2): qty 250

## 2020-02-03 MED ORDER — DOCUSATE SODIUM 50 MG/5ML PO LIQD
100.0000 mg | Freq: Two times a day (BID) | ORAL | Status: DC | PRN
  Filled 2020-02-03: qty 10

## 2020-02-03 MED ORDER — CHLORHEXIDINE GLUCONATE CLOTH 2 % EX PADS
6.0000 | MEDICATED_PAD | Freq: Every day | CUTANEOUS | Status: DC
  Administered 2020-02-04 – 2020-02-05 (×2): 6 via TOPICAL
  Filled 2020-02-03: qty 6

## 2020-02-03 MED ORDER — ROCURONIUM BROMIDE 50 MG/5ML IV SOLN
INTRAVENOUS | Status: AC | PRN
  Administered 2020-02-03: 80 mg via INTRAVENOUS

## 2020-02-03 MED ORDER — THIAMINE HCL 100 MG/ML IJ SOLN
100.0000 mg | Freq: Once | INTRAMUSCULAR | Status: AC
  Administered 2020-02-03: 100 mg via INTRAVENOUS
  Filled 2020-02-03: qty 2

## 2020-02-03 MED ORDER — SODIUM CHLORIDE 0.9 % IV SOLN
1.0000 mg | Freq: Once | INTRAVENOUS | Status: AC
  Administered 2020-02-03: 1 mg via INTRAVENOUS
  Filled 2020-02-03: qty 0.2

## 2020-02-03 MED ORDER — ONDANSETRON HCL 4 MG/2ML IJ SOLN: 4.0000 mg | Freq: Four times a day (QID) | INTRAMUSCULAR | Status: DC | PRN

## 2020-02-03 MED ORDER — FENTANYL BOLUS VIA INFUSION
50.0000 ug | INTRAVENOUS | Status: DC | PRN
  Filled 2020-02-03: qty 50

## 2020-02-03 MED ORDER — FENTANYL 2500MCG IN NS 250ML (10MCG/ML) PREMIX INFUSION
25.0000 ug/h | INTRAVENOUS | Status: DC
  Filled 2020-02-03: qty 250

## 2020-02-03 MED ORDER — FENTANYL 2500MCG IN NS 250ML (10MCG/ML) PREMIX INFUSION
0.0000 ug/h | INTRAVENOUS | Status: DC
  Administered 2020-02-03: 50 ug/h via INTRAVENOUS
  Administered 2020-02-04 – 2020-02-06 (×5): 200 ug/h via INTRAVENOUS
  Administered 2020-02-06: 100 ug/h via INTRAVENOUS
  Filled 2020-02-03 (×7): qty 250

## 2020-02-03 MED ORDER — SODIUM BICARBONATE 8.4 % IV SOLN
50.0000 meq | Freq: Once | INTRAVENOUS | Status: AC
  Administered 2020-02-03: 50 meq via INTRAVENOUS
  Filled 2020-02-03: qty 50

## 2020-02-03 MED ORDER — SODIUM CHLORIDE 0.9 % IV SOLN
500.0000 mg | INTRAVENOUS | Status: DC
  Filled 2020-02-03: qty 500

## 2020-02-03 MED ORDER — INSULIN ASPART 100 UNIT/ML ~~LOC~~ SOLN
0.0000 [IU] | SUBCUTANEOUS | Status: DC
  Administered 2020-02-04: 5 [IU] via SUBCUTANEOUS
  Administered 2020-02-04 (×2): 2 [IU] via SUBCUTANEOUS
  Administered 2020-02-04 (×2): 3 [IU] via SUBCUTANEOUS
  Administered 2020-02-05 (×2): 2 [IU] via SUBCUTANEOUS
  Administered 2020-02-05 (×2): 3 [IU] via SUBCUTANEOUS
  Administered 2020-02-05 (×2): 2 [IU] via SUBCUTANEOUS
  Administered 2020-02-05: 3 [IU] via SUBCUTANEOUS
  Administered 2020-02-06: 4 [IU] via SUBCUTANEOUS
  Administered 2020-02-06 (×3): 2 [IU] via SUBCUTANEOUS
  Administered 2020-02-07: 3 [IU] via SUBCUTANEOUS
  Filled 2020-02-03 (×15): qty 1

## 2020-02-03 MED ORDER — BUDESONIDE 0.25 MG/2ML IN SUSP
0.2500 mg | Freq: Two times a day (BID) | RESPIRATORY_TRACT | Status: DC
  Administered 2020-02-04: 0.25 mg via RESPIRATORY_TRACT
  Filled 2020-02-03: qty 2

## 2020-02-03 MED ORDER — SODIUM CHLORIDE 0.9 % IV SOLN
2.0000 g | Freq: Once | INTRAVENOUS | Status: AC
  Administered 2020-02-03: 2 g via INTRAVENOUS
  Filled 2020-02-03: qty 20

## 2020-02-03 MED ORDER — FENTANYL BOLUS VIA INFUSION
50.0000 ug | INTRAVENOUS | Status: DC | PRN
  Administered 2020-02-06: 50 ug via INTRAVENOUS
  Filled 2020-02-03: qty 50

## 2020-02-03 MED ORDER — MIDAZOLAM HCL 2 MG/2ML IJ SOLN: 2.0000 mg | INTRAMUSCULAR | Status: DC | PRN

## 2020-02-03 NOTE — ED Provider Notes (Signed)
[facility] Emergency Department Provider Note  ____________________________________________   First MD Initiated Contact with Patient 02/02/2020 1918     (approximate)  I have reviewed the triage vital signs and the nursing notes.   HISTORY  Chief Complaint Other (CPR in progress)    HPI Brandon Bolton is a 64 y.o. male  With cardiac arrest.   Per report, the patient was found outside of an apartment.  He reportedly seemed like he was having difficulty breathing.  He was then noted to slump over.  He was found unresponsive, cyanotic, and apneic by the fire department.  CPR was begun.  CPR was continued for approximately 10 minutes before EMS arrived.  He was in PEA on arrival.  He was given 3 rounds of CPR with epinephrine with return of spontaneous circulation.  [place] airway was placed.  In route, he went back into PEA then lost pulses.  He has had ongoing CPR since then.  On arrival to the ED, he has [place] device in place, [place] airway in place, actively receiving CPR.  Mender of history limited due to patient being in extremis with respiratory failure.       Past Medical History:  Diagnosis Date  . CHF (congestive heart failure) (HCC)    EF: 35-40%, global hypokinesis, mod. mitral regurgitation (03/2019)  . COPD (chronic obstructive pulmonary disease) (HCC)   . Diabetes mellitus, type 2 (HCC)   . Drug abuse (HCC)   . Dupuytren's contracture   . ETOH abuse 2018  . Hypertension   . Mixed hyperlipidemia 01/2018    Patient Active Problem List   Diagnosis Date Noted  . Cardiac arrest (HCC) 02/25/2020  . Acute on chronic respiratory failure with hypoxemia (HCC) 05/14/2019  . Sepsis (HCC) 03/05/2019  . CAP (community acquired pneumonia) 03/05/2019  . COPD exacerbation (HCC) 03/05/2019  . Acute respiratory failure with hypoxia and hypercapnia (HCC) 03/05/2019  . Low hemoglobin 02/20/2018  . Edema extremities 10/05/2017  . Cellulitis and abscess of toe of  right foot 11/24/2016  . Essential hypertension 10/04/2016  . Mixed hyperlipidemia 09/06/2016  . Chronic obstructive pulmonary disease (HCC) 09/06/2016    Past Surgical History:  Procedure Laterality Date  . HAND SURGERY      Prior to Admission medications   Medication Sig Start Date End Date Taking? Authorizing Provider  amLODipine (NORVASC) 10 MG tablet Take 1 tablet (10 mg total) by mouth daily. 03/05/19  Yes Doles-Johnson, Teah, NP  carvedilol (COREG) 6.25 MG tablet Take 1 tablet (6.25 mg total) by mouth 2 (two) times daily. 05/17/19 05/16/20 Yes Kasa, Wallis Bamberg, MD  cetirizine (ZYRTEC) 10 MG tablet Take 1 tablet by mouth once daily 04/01/19  Yes Doles-Johnson, Teah, NP  hydrOXYzine (ATARAX/VISTARIL) 25 MG tablet Take 25 mg by mouth 2 (two) times daily as needed. 01/29/20  Yes [provider]  losartan (COZAAR) 25 MG tablet Take 1 tablet (25 mg total) by mouth daily. 05/17/19 05/16/20 Yes Kasa, Wallis Bamberg, MD  SYMBICORT 160-4.5 MCG/ACT inhaler Inhale 2 puffs into the lungs 2 (two) times daily. 03/05/19  Yes Doles-Johnson, Teah, NP  tiotropium (SPIRIVA) 18 MCG inhalation capsule Place 18 mcg into inhaler and inhale daily.   Yes [provider]  diphenhydrAMINE (BENADRYL) 25 mg capsule Take 25 mg by mouth every 6 (six) hours as needed for itching, allergies or sleep.     [provider]  varenicline (CHANTIX STARTING MONTH PAK) 0.5 MG X 11 & 1 MG X 42 tablet Take  one 0.5 mg tablet by mouth once daily for 3 days, then increase to one 0.5 mg tablet twice daily for 4 days, then increase to one 1 mg tablet twice daily. Patient not taking: Reported on 02/10/2020 10/02/19   Flora Lipps, MD  VENTOLIN HFA 108 (90 Base) MCG/ACT inhaler INHALE 2 PUFFS BY MOUTH EVERY 6 HOURS AS NEEDED FOR WHEEZING OR SHORTNESS OF BREATH Patient not taking: Reported on 10-Feb-2020 03/11/19   Nicholes Mango, MD    Allergies Patient has no known allergies.  Family History  Problem Relation Age of Onset  .  Hypertension Father   . Heart disease Father   . Arthritis Father   . Cancer Father   . Allergic Disorder Mother     Social History Social History   Tobacco Use  . Smoking status: Current Every Day Smoker    Packs/day: 1.00    Years: 40.00    Pack years: 40.00    Types: Cigarettes  . Smokeless tobacco: Never Used  Substance Use Topics  . Alcohol use: No    Comment: 2 quarts of beer/day  . Drug use: No    Types: Heroin    Comment: former heroin user (inj)    Review of Systems  Review of Systems  Unable to perform ROS: Patient unresponsive     ____________________________________________  PHYSICAL EXAM:      VITAL SIGNS: ED Triage Vitals  Enc Vitals Group     BP 02/10/2020 1904 138/83     Pulse Rate 2020-02-10 1904 (!) 102     Resp 02/10/20 1904 19     Temp 2020/02/10 1921 (!) 94.3 F (34.6 C)     Temp src --      SpO2 Feb 10, 2020 1904 90 %     Weight --      Height --      Head Circumference --      Peak Flow --      Pain Score --      Pain Loc --      Pain Edu? --      Excl. in [place]? --      Physical Exam    ____________________________________________   LABS (all labs ordered are listed, but only abnormal results are displayed)  Labs Reviewed  CBC WITH DIFFERENTIAL/PLATELET - Abnormal; Notable for the following components:      Result Value   RBC 4.17 (*)    nRBC 0.4 (*)    Lymphs Abs 4.7 (*)    Abs Immature Granulocytes 0.54 (*)    All other components within normal limits  COMPREHENSIVE METABOLIC PANEL - Abnormal; Notable for the following components:   Sodium 125 (*)    Potassium 5.5 (*)    Chloride 90 (*)    CO2 19 (*)    Glucose, Bld 366 (*)    Creatinine, Ser 1.50 (*)    Calcium 8.2 (*)    Total Protein 5.8 (*)    Albumin 3.3 (*)    AST 93 (*)    ALT 64 (*)    GFR calc non Af Amer 49 (*)    GFR calc Af Amer 57 (*)    Anion gap 16 (*)    All other components within normal limits  BRAIN NATRIURETIC PEPTIDE - Abnormal; Notable for the  following components:   B Natriuretic Peptide 292.0 (*)    All other components within normal limits  LACTIC ACID, PLASMA - Abnormal; Notable for the following components:   Lactic  Acid, Venous 9.0 (*)    All other components within normal limits  LACTIC ACID, PLASMA - Abnormal; Notable for the following components:   Lactic Acid, Venous 5.7 (*)    All other components within normal limits  ETHANOL - Abnormal; Notable for the following components:   Alcohol, Ethyl (B) 47 (*)    All other components within normal limits  URINALYSIS, COMPLETE (UACMP) WITH MICROSCOPIC - Abnormal; Notable for the following components:   Color, Urine YELLOW (*)    APPearance CLEAR (*)    Glucose, UA 50 (*)    Bacteria, UA RARE (*)    All other components within normal limits  BLOOD GAS, ARTERIAL - Abnormal; Notable for the following components:   pH, Arterial 6.90 (*)    pCO2 arterial 67 (*)    pO2, Arterial 343 (*)    Bicarbonate 13.1 (*)    Acid-base deficit 20.5 (*)    All other components within normal limits  TROPONIN I (HIGH SENSITIVITY) - Abnormal; Notable for the following components:   Troponin I (High Sensitivity) 38 (*)    All other components within normal limits  TROPONIN I (HIGH SENSITIVITY) - Abnormal; Notable for the following components:   Troponin I (High Sensitivity) 3,472 (*)    All other components within normal limits  RESPIRATORY PANEL BY RT PCR (FLU A&B, COVID)  CULTURE, BLOOD (ROUTINE X 2)  CULTURE, BLOOD (ROUTINE X 2)  EXPECTORATED SPUTUM ASSESSMENT W REFEX TO RESP CULTURE  URINE CULTURE  URINE DRUG SCREEN, QUALITATIVE ([facility] ONLY)  MAGNESIUM  CBC  BASIC METABOLIC PANEL  BLOOD GAS, ARTERIAL  MAGNESIUM  PHOSPHORUS  HEMOGLOBIN A1C  BASIC METABOLIC PANEL  POC SARS CORONAVIRUS 2 AG    ____________________________________________  EKG: Normal sinus rhythm, ventricular rate 88.  QRS 172, QTc 540.  Intraventricular conduction delay, no significant change from compared to  prior other than right increase. ________________________________________  RADIOLOGY All imaging, including plain films, CT scans, and ultrasounds, independently reviewed by me, and interpretations confirmed via formal radiology reads.  ED MD interpretation:   Chest x-[place]: ET tube in place, no acute pneumonia Abdominal x-[place]: OG tube in place Chest x-[place] 2: Right IJ central line in place, no pneumothorax  Official radiology report(s): DG Abdomen 1 View  Result Date: Feb 12, 2020 CLINICAL DATA:  OG tube placement EXAM: ABDOMEN - 1 VIEW COMPARISON:  None. FINDINGS: OG tube tip is in the stomach. Nonobstructive bowel gas pattern. Visualized lung bases clear. IMPRESSION: OG tube tip in the stomach. Electronically Signed   By: Charlett Nose M.D.   On: 2020/02/12 19:47   DG Chest Portable 1 View  Result Date: 02-12-20 CLINICAL DATA:  Central line placement EXAM: PORTABLE CHEST 1 VIEW COMPARISON:  2020-02-12 FINDINGS: Right central line tip is in the SVC. No pneumothorax. Endotracheal tube and NG tube are unchanged. Heart is normal size. Increasing bibasilar opacities, likely atelectasis. No effusions. No acute bony abnormality. IMPRESSION: Right central line tip in the SVC.  No pneumothorax. Bibasilar atelectasis. Electronically Signed   By: Charlett Nose M.D.   On: 12-Feb-2020 21:08   DG Chest Portable 1 View  Result Date: 12-Feb-2020 CLINICAL DATA:  Cardiac arrest EXAM: PORTABLE CHEST 1 VIEW COMPARISON:  05/15/2019 FINDINGS: Endotracheal to is 2.5 cm above the carina. NG tube enters the stomach. Heart is normal size. No confluent opacities or effusions. No pneumothorax. No acute bony abnormality. IMPRESSION: No acute cardiopulmonary disease. Electronically Signed   By: Charlett Nose M.D.   On: 02/12/2020  19:46    ____________________________________________  PROCEDURES   Procedure(s) performed (including Critical Care):  .Critical Care Performed by: Shaune PollackIsaacs, Morse Brueggemann, MD Authorized by: Shaune PollackIsaacs,  Everlene Cunning, MD   Critical care provider statement:    Critical care time (minutes):  75   Critical care time was exclusive of:  Separately billable procedures and treating other patients and teaching time   Critical care was necessary to treat or prevent imminent or life-threatening deterioration of the following conditions:  Cardiac failure, respiratory failure, circulatory failure and metabolic crisis   Critical care was time spent personally by me on the following activities:  Development of treatment plan with patient or surrogate, discussions with consultants, evaluation of patient's response to treatment, examination of patient, obtaining history from patient or surrogate, ordering and performing treatments and interventions, ordering and review of laboratory studies, ordering and review of radiographic studies, pulse oximetry, re-evaluation of patient's condition and review of old charts   I assumed direction of critical care for this patient from another provider in my specialty: no   .1-3 Lead EKG Interpretation Performed by: Shaune PollackIsaacs, Belicia Difatta, MD Authorized by: Shaune PollackIsaacs, Chalice Philbert, MD     Interpretation: normal     ECG rate:  80-100   ECG rate assessment: normal     Rhythm: sinus rhythm     Ectopy: PVCs     Conduction: normal   Comments:     Indication: Cardiac arrest Procedure Name: Intubation Date/Time: 02/20/2020 10:22 PM Performed by: Shaune PollackIsaacs, Kryslyn Helbig, MD Pre-anesthesia Checklist: Patient identified, Patient being monitored, Emergency Drugs available, Timeout performed and Suction available Oxygen Delivery Method: Non-rebreather mask Preoxygenation: Pre-oxygenation with 100% oxygen Induction Type: Rapid sequence Ventilation: Mask ventilation without difficulty Laryngoscope Size: 4 and Glidescope Grade View: Grade I Tube size: 7.5 mm Number of attempts: 1 Airway Equipment and Method: Video-laryngoscopy and Rigid stylet Placement Confirmation: ETT inserted through vocal cords under  direct vision,  CO2 detector and Breath sounds checked- equal and bilateral Tube secured with: ETT holder Dental Injury: Teeth and Oropharynx as per pre-operative assessment  Difficulty Due To: Difficulty was unanticipated Future Recommendations: Recommend- induction with short-acting agent, and alternative techniques readily available    CPR  Date/Time: 02/20/2020 10:22 PM Performed by: Shaune PollackIsaacs, Khalaya Mcgurn, MD Authorized by: Shaune PollackIsaacs, Bandon Sherwin, MD  CPR Procedure Details:      Amount of time prior to administration of ACLS/BLS (minutes):  10   ACLS/BLS initiated by EMS: Yes     CPR/ACLS performed in the ED: Yes     Duration of CPR (minutes):  10   Outcome: ROSC obtained    CPR performed via ACLS guidelines under my direct supervision.  See RN documentation for details including defibrillator use, medications, doses and timing. Marthenia Rolling.Central Line  Date/Time: 02/06/2020 10:22 PM Performed by: Shaune PollackIsaacs, Marik Sedore, MD Authorized by: Shaune PollackIsaacs, Casidy Alberta, MD   Consent:    Consent obtained:  Emergent situation   Alternatives discussed:  Alternative treatment Pre-procedure details:    Hand hygiene: Hand hygiene performed prior to insertion     Sterile barrier technique: All elements of maximal sterile technique followed     Skin preparation:  2% chlorhexidine   Skin preparation agent: Skin preparation agent completely dried prior to procedure   Anesthesia (see MAR for exact dosages):    Anesthesia method:  Local infiltration   Local anesthetic:  Lidocaine 1% w/o epi Procedure details:    Location:  R internal jugular   Site selection rationale:  Ease of access with ultrasound, safe location, lower risk of infection  Patient position:  Trendelenburg   Procedural supplies:  Triple lumen   Catheter size:  7 Fr   Landmarks identified: no     Ultrasound guidance: yes     Sterile ultrasound techniques: Sterile gel and sterile probe covers were used     Number of attempts:  1   Successful placement: yes     Post-procedure details:    Post-procedure:  Dressing applied and line sutured   Assessment:  Blood return through all ports, free fluid flow, no pneumothorax on x-[place] and placement verified by x-[place]   Patient tolerance of procedure:  Tolerated well, no immediate complications    ____________________________________________  INITIAL IMPRESSION / MDM / ASSESSMENT AND PLAN / ED COURSE  As part of my medical decision making, I reviewed the following data within the electronic MEDICAL RECORD NUMBER Nursing notes reviewed and incorporated, Old chart reviewed, Notes from prior ED visits, and [place] Controlled Substance Database       *TIDUS UPCHURCH was evaluated in Emergency Department on 02/22/2020 for the symptoms described in the history of present illness. He was evaluated in the context of the global COVID-19 pandemic, which necessitated consideration that the patient might be at risk for infection with the SARS-CoV-2 virus that causes COVID-19. Institutional protocols and algorithms that pertain to the evaluation of patients at risk for COVID-19 are in a state of rapid change based on information released by regulatory bodies including the CDC and federal and state organizations. These policies and algorithms were followed during the patient's care in the ED.  Some ED evaluations and interventions may be delayed as a result of limited staffing during the pandemic.*     Medical Decision Making: 64 year old male here with cardiac arrest.  Primary suspicion is primary respiratory arrest given that he had return of circulation once [facility] airway placed and intubated.  End-tidal greater than 100 and improving on capnography.  Initial blood gas shows profound primarily respiratory but also metabolic acidosis.  He was started on IV fluids, given bicarb, and vent settings adjusted.  Duo nebs given.  Will start on empiric antibiotics given lactic acidosis I suspect this is more so due to his prolonged downtime.  EKG  shows intraventricular conduction delay, troponin is pending.  Will admit to the ICU.  Right IJ placed by myself at request of ICU as they do not have an MD in house to perform this.  Tolerated well.  Family, niece, updated via telephone and is available.  ____________________________________________  FINAL CLINICAL IMPRESSION(S) / ED DIAGNOSES  Final diagnoses:  Cardiac arrest (HCC)  Respiratory arrest (HCC)     MEDICATIONS GIVEN DURING THIS VISIT:  Medications  norepinephrine (LEVOPHED) 4mg  in premix infusion (0 mcg/min Intravenous Stopped 02/06/2020 2155)  heparin injection 5,000 Units (5,000 Units Subcutaneous Given 02/13/2020 2223)  acetaminophen (TYLENOL) tablet 650 mg (has no administration in time range)  ondansetron (ZOFRAN) injection 4 mg (has no administration in time range)  thiamine tablet 100 mg (100 mg Oral Not Given 02/08/2020 2132)  multivitamin with minerals tablet 1 tablet (1 tablet Oral Not Given 02/12/2020 2132)  folic acid (FOLVITE) tablet 1 mg (1 mg Oral Not Given 02/09/2020 2131)  dexmedetomidine (PRECEDEX) 400 MCG/100ML (4 mcg/mL) infusion (has no administration in time range)  fentaNYL 2132 in NS (15mcg/ml) infusion-PREMIX (50 mcg/hr Intravenous New Bag/Given 02/18/2020 2143)  fentaNYL (SUBLIMAZE) bolus via infusion 50 mcg (has no administration in time range)  docusate (COLACE) 50 MG/5ML liquid 100 mg (has no administration  in time range)  insulin aspart (novoLOG) injection 0-15 Units (has no administration in time range)  cefTRIAXone (ROCEPHIN) 2 g in sodium chloride 0.9 % 100 mL IVPB (2 g Intravenous New Bag/Given Feb 09, 2020 2217)  azithromycin (ZITHROMAX) 500 mg in sodium chloride 0.9 % 250 mL IVPB (has no administration in time range)  budesonide (PULMICORT) nebulizer solution 0.25 mg (has no administration in time range)  ipratropium-albuterol (DUONEB) 0.5-2.5 (3) MG/3ML nebulizer solution 3 mL (has no administration in time range)  cefTRIAXone (ROCEPHIN) 1 g in  sodium chloride 0.9 % 100 mL IVPB (has no administration in time range)  azithromycin (ZITHROMAX) 500 mg in sodium chloride 0.9 % 250 mL IVPB (has no administration in time range)  methylPREDNISolone sodium succinate (SOLU-MEDROL) 125 mg/2 mL injection 60 mg (has no administration in time range)  EPINEPHrine (ADRENALIN) 1 MG/10ML injection (1 Syringe Intravenous Given 02-09-2020 1900)  sodium bicarbonate injection (50 mEq Intravenous Given 2020/02/09 1917)  etomidate (AMIDATE) injection (15 mg Intravenous Given 02-09-20 1906)  rocuronium (ZEMURON) injection (80 mg Intravenous Given 2020/02/09 1906)  thiamine (B-1) injection 100 mg (100 mg Intravenous Given 2020-02-09 1930)  folic acid 1 mg in sodium chloride 0.9 % 50 mL IVPB (0 mg Intravenous Stopped 09-Feb-2020 2030)  ipratropium-albuterol (DUONEB) 0.5-2.5 (3) MG/3ML nebulizer solution 3 mL (3 mLs Nebulization Given 02/09/2020 1935)  sodium bicarbonate injection (50 mEq Intravenous Given 02/09/20 1927)  sodium chloride 0.9 % bolus 1,000 mL (0 mLs Intravenous Stopped 2020/02/09 1952)  sodium chloride 0.9 % bolus 1,000 mL (0 mLs Intravenous Stopped 02-09-2020 2031)  pantoprazole (PROTONIX) 80 mg in sodium chloride 0.9 % 100 mL IVPB (0 mg Intravenous Stopped 09-Feb-2020 2117)  fentaNYL (SUBLIMAZE) injection 50 mcg (50 mcg Intravenous Given 2020/02/09 1954)  ipratropium-albuterol (DUONEB) 0.5-2.5 (3) MG/3ML nebulizer solution (  Given 09-Feb-2020 2046)  fentaNYL (SUBLIMAZE) injection 50 mcg (50 mcg Intravenous Given 02-09-20 2156)  sodium bicarbonate injection 50 mEq (50 mEq Intravenous Given Feb 09, 2020 2220)     ED Discharge Orders    None       Note:  This document was prepared using Dragon voice recognition software and may include unintentional dictation errors.   Shaune Pollack, MD 02-09-2020 2225

## 2020-02-03 NOTE — ED Notes (Signed)
30 mcg's of epi given by Dr. Erma Heritage.

## 2020-02-03 NOTE — ED Notes (Signed)
15 mg of etomidate and 80 of roc given by Shanda Bumps, RN at [phone]. Verified by Chrissy RN and Tresa Endo, RN

## 2020-02-03 NOTE — ED Triage Notes (Signed)
Pt arrival via [facility] from home emergency traffic CPR in progress. Pt fell down the stairs when EMS was called and the fire department found him pulseless. Fire gave CPR until Ems arrived. With Ems, 3 doses of epi was given and CPR by the lucas was given. No shockable rhythm was found, but patient gained ROSC in truck. Pt was then hypotensive and lost pulse again in the truck about 10 minutes from arrival.

## 2020-02-03 NOTE — ED Notes (Signed)
30 mcg's of epi given by Dr. Isaacs. 

## 2020-02-03 NOTE — ED Notes (Addendum)
Pt shoes, wallet, inhaler, and cellhpone placed in patient bag.

## 2020-02-03 NOTE — Code Documentation (Signed)
NS bolus started.  

## 2020-02-03 NOTE — ED Notes (Signed)
Dr. Raynaldo Opitz orders to start levo at 10 mcgs

## 2020-02-03 NOTE — H&P (Signed)
NAME:  Brandon Bolton, MRN:  [id number], DOB:  1956-06-12, LOS: 0 ADMISSION DATE:  2020/03/02, CONSULTATION DATE:  Mar 02, 2020 REFERRING MD:  Dr. Erma Heritage , CHIEF COMPLAINT:  Cardiac arrest    Brief History   64yo male found down after cardiac arrest, he received 3 rounds of epi and CPR with eventual ROSC but patient again lost pulses en route to ED. Rhythm on admission was PEA he again received epi with bicarb and ROSC was again achieved.   History of present illness   Brandon Bolton is a 64yo male with PMX significant for HTN, ETOH abuse, polysubstance abuse, type 2 diabetes, COPD, and CHF who was witnessed respiratory distress with attempt to utilize inhaler but suffered a fall down stairs at which time EMS was called and patient was found pulseless, EMS started CPR and administered 3 rounds of Epi with eventual ROSC. Patient was en route to hospital at which time he again lost pulse and CPR was resumed, he received one more round of Epi and arrived to ED with CPR in progress. Rhythm check in ED revealed PEA, ED provider continued ACLS protocol with additional Epi and bicarb pushes and ROSC was again achieved.   Vitals significant for hypothermia and hypotension that resolved with vasopressors. Labwork on admission significant for hyponatremia, hyperkalemia, hyperglycemia, AKI, elevated LFTs, and elevated lactic acid. CXR with no acute abnormalities.   Past Medical History   Past Medical History:  Diagnosis Date  . CHF (congestive heart failure) (HCC)    EF: 35-40%, global hypokinesis, mod. mitral regurgitation (03/2019)  . COPD (chronic obstructive pulmonary disease) (HCC)   . Diabetes mellitus, type 2 (HCC)   . Drug abuse (HCC)   . Dupuytren's contracture   . ETOH abuse 2018  . Hypertension   . Mixed hyperlipidemia 01/2018    Significant Hospital Events   Admitted post cardiac arrest 2020-03-02  Consults:    Procedures:  ETT 2020/03/02  Significant Diagnostic Tests:  CXR Mar 02, 2020 > Megative    Micro Data:    Antimicrobials:  COVID 2020/03/02 > negative  Blood culture 02-Mar-2020 > Sputum culture 02-Mar-2020 > Urine culture 03/02/20 >  Interim history/subjective:  Sedated on vent   Objective   Blood pressure 133/81, pulse 70, temperature (!) 96.1 F (35.6 C), resp. rate 18, height 6' (1.829 m), weight 84.8 kg, SpO2 100 %.    Vent Mode: AC FiO2 (%):  [60 %-100 %] 60 % Set Rate:  [18 bmp-22 bmp] 22 bmp Vt Set:  [500 mL] 500 mL PEEP:  [5 cmH20] 5 cmH20  No intake or output data in the 24 hours ending 03-02-2020 2024 Filed Weights   03-02-20 2000  Weight: 84.8 kg    Examination: General: Chronically ill appearing elderly male on mechanical ventilation, in NAD HEENT: ETT, MM pink/moist, PERRL, Pupils 35mm and sluggish  Neuro: Unresponsive on vent  CV: s1s2 regular rate and rhythm, no murmur, rubs, or gallops,  PULM:  Diffuse inspiratory and expiratory wheezing with bilateral rales, tolerating vent well, no increased work of breathing  GI: soft, bowel sounds active in all 4 quadrants, non-tender, non-distended, coffee ground secretion from OG tube  Extremities: warm/dry, no edema  Skin: no rashes or lesions  Resolved Hospital Problem list     Assessment & Plan:   Cardiac arrest -given description of respiratory distress with history of COPD and CHF high suspicion for respiratory arrest  -Unknown downtime, PEA seen on arrival  Circulatory shock P: Given unknown downtime with PEA rhythm  seen on arrival no a candidate for TTM CVL placed in ED Continue levophed for MAP goal greater than 65 Assess CVP's, echo UDS negative ETOH 47 on admisison  Trend troponin, lactate Cardiology consult in AM Hold preadmission Norvasc, Coreg, and Cozaar,   Acute hypoxic and hypercapnic respiratory failure  -Likely the cause of cardiac arrest, ABG post intubation 6.9 / 67 / 343 / 13.1 P: Continue ventilator support with lung protective strategies  Wean PEEP and FiO2 for sats greater  than 88%. Head of bed elevated 30 degrees. Plateau pressures less than 30 cm H20.  Follow intermittent chest x-Goodloe and ABG.   Hold SBT until off NMB. Ensure adequate pulmonary hygiene  Follow cultures  VAP bundle in place  PAD protocol Repeat Bicarb push  Acute Exacerbation of Chronic COPD  Active smoker 1-2 PPD -Per bystander report patient was seen in what may have been acute COPD excerebration as he was attempting to use home inhaler and appeared short of breath. Assessment on admission reveled diffuse inspiratory and expiratory wheezing -Home meds include Symbicort, Ventolin, and Spiriva  -PFT 11/05/2019 consistent with sever obstructive airway disease  P: Vent support as above  Short acting bronchodilators IV steroids  Continue empiric antibiotics as below  Obtain and follow cultures  Smoking cessation education when appropriate  Appropriate vaccinations prior to discharge   At risk for anoxic encephalopathy. P: Sedation with fentanyl and precedex  RASS goal 0 to -1 Delirium precautions  Minimize sedation as able   Systolic congestive heart failure  -Most recent ECHO 05/14/2019 reveled EF 30-35%  P: Hold home antihypertensives given circulatory shock, resume when able  Repeat ECHO Continuous telemetry  Strict I/O Daily weight  Assess BNP   Type 2 diabetes  Hyperglycemic  -Home medication reconciliation doesn't revel any maintaince medications  P: SSI CBG q4hrs Obtain hemoglobin A1C  Polysubstance abuse  ETOH abuse  -Per chart review patient appears to have long history of polysubstance abuse and ETOH abuse  P: Folate, Thiamine, and multivitamin supplementations Precedex for sedation  CIWA scales  Cessation will be addressed once patient is stabilized   Lactic acidosis  -Concern for CAP vs aspiration pneumonia given report of purulent respiratory sputum observed on intubation  P: Continue empiric antibiotics  Follow cultures Trend CBC and fever curve   Trend lactic acid   Coffee ground GI secretions  -Given history of ETOH abuse concern for GI bleed  P: BID PPI Trend CBC NG tube to LIWS  Monitor for sign of GI bleed   Hyperkalemia  P: Trend Bmet  May need Lokalma if K remains elevated  Continuous telemetry   Acute Kidney Injury  -in the setting of cardiac arrest  P: Follow renal function / urine output Trend Bmet Avoid nephrotoxins, ensure adequate renal perfusion  IV hydration provided on admission    Best practice:  Diet: NPO Pain/Anxiety/Delirium protocol (if indicated): Fentanyl and Precedex  VAP protocol (if indicated): In place DVT prophylaxis: Heparin  GI prophylaxis: PPI Glucose control: SSI Mobility: Bedrest  Code Status: Full Family Communication: Updated by ED provider  Disposition: ICU  Labs   CBC: Recent Labs  Lab 03/01/2020 1919  WBC 8.1  NEUTROABS PENDING  HGB 13.8  HCT 41.4  MCV 99.3  PLT 102    Basic Metabolic Panel: Recent Labs  Lab 03-01-20 1919  NA 125*  K 5.5*  CL 90*  CO2 19*  GLUCOSE 366*  BUN 9  CREATININE 1.50*  CALCIUM 8.2*  MG 2.4  GFR: Estimated Creatinine Clearance: 55.3 mL/min (A) (by C-G formula based on SCr of 1.5 mg/dL (H)). Recent Labs  Lab 02/14/2020 1919  WBC 8.1    Liver Function Tests: Recent Labs  Lab 02/13/2020 1919  AST 93*  ALT 64*  ALKPHOS 69  BILITOT 0.7  PROT 5.8*  ALBUMIN 3.3*   No results for input(s): LIPASE, AMYLASE in the last 168 hours. No results for input(s): AMMONIA in the last 168 hours.  ABG    Component Value Date/Time   PHART 6.90 (LL) 02/02/2020 1953   PCO2ART 67 (HH) 02/25/2020 1953   PO2ART 343 (H) 02/12/2020 1953   HCO3 13.1 (L) 02/14/2020 1953   ACIDBASEDEF 20.5 (H) 02/02/2020 1953   O2SAT 99.8 02/04/2020 1953     Coagulation Profile: No results for input(s): INR, PROTIME in the last 168 hours.  Cardiac Enzymes: No results for input(s): CKTOTAL, CKMB, CKMBINDEX, TROPONINI in the last 168 hours.  HbA1C:  Hgb A1c MFr Bld  Date/Time Value Ref Range Status  05/15/2019 02:55 AM 5.3 4.8 - 5.6 % Final    Comment:    (NOTE) Pre diabetes:          5.7%-6.4% Diabetes:              >6.4% Glycemic control for   <7.0% adults with diabetes   02/13/2018 06:56 PM 5.6 4.8 - 5.6 % Final    Comment:             Prediabetes: 5.7 - 6.4          Diabetes: >6.4          Glycemic control for adults with diabetes: <7.0     CBG: No results for input(s): GLUCAP in the last 168 hours.  Review of Systems:   Unable to gather due to sedation and intubation   Past Medical History  He,  has a past medical history of CHF (congestive heart failure) (HCC), COPD (chronic obstructive pulmonary disease) (HCC), Diabetes mellitus, type 2 (HCC), Drug abuse (HCC), Dupuytren's contracture, ETOH abuse (2018), Hypertension, and Mixed hyperlipidemia (01/2018).   Surgical History    Past Surgical History:  Procedure Laterality Date  . HAND SURGERY       Social History   reports that he has been smoking cigarettes. He has a 40.00 pack-year smoking history. He has never used smokeless tobacco. He reports that he does not drink alcohol or use drugs.   Family History   His family history includes Allergic Disorder in his mother; Arthritis in his father; Cancer in his father; Heart disease in his father; Hypertension in his father.   Allergies No Known Allergies   Home Medications  Prior to Admission medications   Medication Sig Start Date End Date Taking? Authorizing Provider  amLODipine (NORVASC) 10 MG tablet Take 1 tablet (10 mg total) by mouth daily. 03/05/19  Yes Doles-Johnson, Teah, NP  carvedilol (COREG) 6.25 MG tablet Take 1 tablet (6.25 mg total) by mouth 2 (two) times daily. 05/17/19 05/16/20 Yes Kasa, Wallis Bamberg, MD  cetirizine (ZYRTEC) 10 MG tablet Take 1 tablet by mouth once daily 04/01/19  Yes Doles-Johnson, Teah, NP  hydrOXYzine (ATARAX/VISTARIL) 25 MG tablet Take 25 mg by mouth 2 (two) times daily as needed.  01/29/20  Yes [provider]  losartan (COZAAR) 25 MG tablet Take 1 tablet (25 mg total) by mouth daily. 05/17/19 05/16/20 Yes Kasa, Wallis Bamberg, MD  SYMBICORT 160-4.5 MCG/ACT inhaler Inhale 2 puffs into the lungs 2 (two) times daily. 03/05/19  Yes Doles-Johnson, Teah, NP  tiotropium (SPIRIVA) 18 MCG inhalation capsule Place 18 mcg into inhaler and inhale daily.   Yes [provider]  diphenhydrAMINE (BENADRYL) 25 mg capsule Take 25 mg by mouth every 6 (six) hours as needed for itching, allergies or sleep.     [provider]  varenicline (CHANTIX STARTING MONTH PAK) 0.5 MG X 11 & 1 MG X 42 tablet Take one 0.5 mg tablet by mouth once daily for 3 days, then increase to one 0.5 mg tablet twice daily for 4 days, then increase to one 1 mg tablet twice daily. Patient not taking: Reported on 2020-02-14 10/02/19   Erin Fulling, MD  VENTOLIN HFA 108 (90 Base) MCG/ACT inhaler INHALE 2 PUFFS BY MOUTH EVERY 6 HOURS AS NEEDED FOR WHEEZING OR SHORTNESS OF BREATH Patient not taking: Reported on 2020-02-14 03/11/19   Ramonita Lab, MD     Critical care time:    Performed by: Delfin Gant   Total critical care time: 55 minutes  Critical care time was exclusive of separately billable procedures and treating other patients.  Critical care was necessary to treat or prevent imminent or life-threatening deterioration.  Critical care was time spent personally by me on the following activities: development of treatment plan with patient and/or surrogate as well as nursing, discussions with consultants, evaluation of patient's response to treatment, examination of patient, obtaining history from patient or surrogate, ordering and performing treatments and interventions, ordering and review of laboratory studies, ordering and review of radiographic studies, pulse oximetry and re-evaluation of patient's condition.  Delfin Gant, NP-C [facility] Contact / Pager information can  be found on Amion  2020-02-14, 10:36 PM

## 2020-02-03 NOTE — Code Documentation (Signed)
Pulse check, faint pulse noted by MD and nures. Pt switched off the lucus at this time, ROSC achieved so no CPR started again.

## 2020-02-04 ENCOUNTER — Inpatient Hospital Stay: Payer: Medicaid Other

## 2020-02-04 ENCOUNTER — Inpatient Hospital Stay (HOSPITAL_COMMUNITY)
Admit: 2020-02-04 | Discharge: 2020-02-04 | Disposition: A | Discharge status: Home / Self Care | Payer: Medicaid Other | Attending: Acute Care | Admitting: Acute Care

## 2020-02-04 DIAGNOSIS — I351 Nonrheumatic aortic (valve) insufficiency: Secondary | ICD-10-CM

## 2020-02-04 DIAGNOSIS — I340 Nonrheumatic mitral (valve) insufficiency: Secondary | ICD-10-CM

## 2020-02-04 LAB — BLOOD GAS, ARTERIAL
Acid-base deficit: 5.5 mmol/L — ABNORMAL HIGH (ref 0.0–2.0)
Bicarbonate: 23.7 mmol/L (ref 20.0–28.0)
FIO2: 0.8
MECHVT: 500 mL
Mechanical Rate: 22
O2 Saturation: 99.3 %
PEEP: 5 cmH2O
Patient temperature: 37
RATE: 22 resp/min
pCO2 arterial: 62 mmHg — ABNORMAL HIGH (ref 32.0–48.0)
pH, Arterial: 7.19 — CL (ref 7.350–7.450)
pO2, Arterial: 180 mmHg — ABNORMAL HIGH (ref 83.0–108.0)

## 2020-02-04 LAB — TROPONIN I (HIGH SENSITIVITY): Troponin I (High Sensitivity): 25859 ng/L (ref <18)

## 2020-02-04 LAB — TRIGLYCERIDES: Triglycerides: 284 mg/dL — ABNORMAL HIGH (ref <150)

## 2020-02-04 LAB — CBC
HCT: 42.9 % (ref 39.0–52.0)
Hemoglobin: 14.8 g/dL (ref 13.0–17.0)
MCH: 33.3 pg (ref 26.0–34.0)
MCHC: 34.5 g/dL (ref 30.0–36.0)
MCV: 96.4 fL (ref 80.0–100.0)
Platelets: 266 10*3/uL (ref 150–400)
RBC: 4.45 MIL/uL (ref 4.22–5.81)
RDW: 13.2 % (ref 11.5–15.5)
WBC: 13.6 10*3/uL — ABNORMAL HIGH (ref 4.0–10.5)
nRBC: 0.1 % (ref 0.0–0.2)

## 2020-02-04 LAB — BASIC METABOLIC PANEL
Anion gap: 9 (ref 5–15)
Anion gap: 8 (ref 5–15)
BUN: 11 mg/dL (ref 8–23)
BUN: 14 mg/dL (ref 8–23)
CO2: 26 mmol/L (ref 22–32)
CO2: 24 mmol/L (ref 22–32)
Calcium: 6.7 mg/dL — ABNORMAL LOW (ref 8.9–10.3)
Calcium: 6.9 mg/dL — ABNORMAL LOW (ref 8.9–10.3)
Chloride: 95 mmol/L — ABNORMAL LOW (ref 98–111)
Chloride: 101 mmol/L (ref 98–111)
Creatinine, Ser: 1.26 mg/dL — ABNORMAL HIGH (ref 0.61–1.24)
Creatinine, Ser: 1.27 mg/dL — ABNORMAL HIGH (ref 0.61–1.24)
GFR calc Af Amer: >60 mL/min (ref >60)
GFR calc Af Amer: >60 mL/min (ref >60)
GFR calc non Af Amer: >60 mL/min (ref >60)
GFR calc non Af Amer: 60 mL/min — ABNORMAL LOW (ref >60)
Glucose, Bld: 201 mg/dL — ABNORMAL HIGH (ref 70–99)
Glucose, Bld: 124 mg/dL — ABNORMAL HIGH (ref 70–99)
Potassium: 4.8 mmol/L (ref 3.5–5.1)
Potassium: 4.3 mmol/L (ref 3.5–5.1)
Sodium: 130 mmol/L — ABNORMAL LOW (ref 135–145)
Sodium: 133 mmol/L — ABNORMAL LOW (ref 135–145)

## 2020-02-04 LAB — HEPARIN LEVEL (UNFRACTIONATED)
Heparin Unfractionated: 0.89 IU/mL — ABNORMAL HIGH (ref 0.30–0.70)
Heparin Unfractionated: 0.58 IU/mL (ref 0.30–0.70)

## 2020-02-04 LAB — GLUCOSE, CAPILLARY
Glucose-Capillary: 115 mg/dL — ABNORMAL HIGH (ref 70–99)
Glucose-Capillary: 127 mg/dL — ABNORMAL HIGH (ref 70–99)
Glucose-Capillary: 150 mg/dL — ABNORMAL HIGH (ref 70–99)
Glucose-Capillary: 175 mg/dL — ABNORMAL HIGH (ref 70–99)
Glucose-Capillary: 182 mg/dL — ABNORMAL HIGH (ref 70–99)
Glucose-Capillary: 247 mg/dL — ABNORMAL HIGH (ref 70–99)

## 2020-02-04 LAB — PROTIME-INR
INR: 1.2 (ref 0.8–1.2)
Prothrombin Time: 15.4 seconds — ABNORMAL HIGH (ref 11.4–15.2)

## 2020-02-04 LAB — HEMOGLOBIN AND HEMATOCRIT, BLOOD
HCT: 44 % (ref 39.0–52.0)
HCT: 40.7 % (ref 39.0–52.0)
Hemoglobin: 14.6 g/dL (ref 13.0–17.0)
Hemoglobin: 14.2 g/dL (ref 13.0–17.0)

## 2020-02-04 LAB — PHOSPHORUS: Phosphorus: 5.7 mg/dL — ABNORMAL HIGH (ref 2.5–4.6)

## 2020-02-04 LAB — ECHOCARDIOGRAM COMPLETE
Height: 72 in
Weight: 2991.2 oz

## 2020-02-04 LAB — MAGNESIUM
Magnesium: 1.8 mg/dL (ref 1.7–2.4)
Magnesium: 1.9 mg/dL (ref 1.7–2.4)

## 2020-02-04 LAB — PROCALCITONIN: Procalcitonin: 7.87 ng/mL

## 2020-02-04 LAB — APTT: aPTT: >160 seconds (ref 24–36)

## 2020-02-04 LAB — MRSA PCR SCREENING: MRSA by PCR: NEGATIVE

## 2020-02-04 LAB — LACTIC ACID, PLASMA: Lactic Acid, Venous: 2.1 mmol/L (ref 0.5–1.9)

## 2020-02-04 MED ORDER — MIDAZOLAM HCL 2 MG/2ML IJ SOLN
2.0000 mg | INTRAMUSCULAR | Status: DC | PRN
  Administered 2020-02-04: 2 mg via INTRAVENOUS
  Filled 2020-02-04: qty 2

## 2020-02-04 MED ORDER — HEPARIN (PORCINE) 25000 UT/250ML-% IV SOLN
1000.0000 [IU]/h | INTRAVENOUS | Status: DC
  Administered 2020-02-04: 1100 [IU]/h via INTRAVENOUS
  Administered 2020-02-05: 950 [IU]/h via INTRAVENOUS
  Filled 2020-02-04 (×2): qty 250

## 2020-02-04 MED ORDER — PANTOPRAZOLE SODIUM 40 MG IV SOLR
40.0000 mg | INTRAVENOUS | Status: DC
  Administered 2020-02-04: 40 mg via INTRAVENOUS
  Filled 2020-02-04: qty 40

## 2020-02-04 MED ORDER — HEPARIN BOLUS VIA INFUSION
4000.0000 [IU] | Freq: Once | INTRAVENOUS | Status: AC
  Administered 2020-02-04: 4000 [IU] via INTRAVENOUS
  Filled 2020-02-04: qty 4000

## 2020-02-04 MED ORDER — FOLIC ACID 5 MG/ML IJ SOLN
1.0000 mg | Freq: Every day | INTRAMUSCULAR | Status: DC
  Administered 2020-02-04 – 2020-02-06 (×3): 1 mg via INTRAVENOUS
  Filled 2020-02-04 (×5): qty 0.2

## 2020-02-04 MED ORDER — NOREPINEPHRINE BITARTRATE 1 MG/ML IV SOLN
0.0000 ug/min | INTRAVENOUS | Status: DC
  Filled 2020-02-04 (×2): qty 4

## 2020-02-04 MED ORDER — BUDESONIDE 0.5 MG/2ML IN SUSP
0.5000 mg | Freq: Two times a day (BID) | RESPIRATORY_TRACT | Status: DC
  Administered 2020-02-04 – 2020-02-07 (×5): 0.5 mg via RESPIRATORY_TRACT
  Filled 2020-02-04 (×5): qty 2

## 2020-02-04 MED ORDER — MIDAZOLAM HCL 2 MG/2ML IJ SOLN
2.0000 mg | Freq: Once | INTRAMUSCULAR | Status: AC
  Administered 2020-02-04: 2 mg via INTRAVENOUS
  Filled 2020-02-04: qty 2

## 2020-02-04 MED ORDER — PROPOFOL 1000 MG/100ML IV EMUL
INTRAVENOUS | Status: AC
  Filled 2020-02-04: qty 100

## 2020-02-04 MED ORDER — METHYLPREDNISOLONE SODIUM SUCC 40 MG IJ SOLR
20.0000 mg | Freq: Two times a day (BID) | INTRAMUSCULAR | Status: DC
  Administered 2020-02-04 – 2020-02-06 (×5): 20 mg via INTRAVENOUS
  Filled 2020-02-04 (×5): qty 1

## 2020-02-04 MED ORDER — SODIUM CHLORIDE 0.9 % IV SOLN
3.0000 g | Freq: Four times a day (QID) | INTRAVENOUS | Status: DC
  Administered 2020-02-04 – 2020-02-07 (×12): 3 g via INTRAVENOUS
  Filled 2020-02-04: qty 3
  Filled 2020-02-04: qty 8
  Filled 2020-02-04: qty 3
  Filled 2020-02-04 (×2): qty 8
  Filled 2020-02-04 (×5): qty 3
  Filled 2020-02-04: qty 8
  Filled 2020-02-04: qty 3
  Filled 2020-02-04: qty 8
  Filled 2020-02-04: qty 3
  Filled 2020-02-04: qty 8

## 2020-02-04 MED ORDER — PERFLUTREN LIPID MICROSPHERE
1.0000 mL | INTRAVENOUS | Status: AC | PRN
  Administered 2020-02-04: 09:00:00 4 mL via INTRAVENOUS
  Filled 2020-02-04: qty 10

## 2020-02-04 MED ORDER — MAGNESIUM SULFATE IN D5W 1-5 GM/100ML-% IV SOLN
1.0000 g | Freq: Once | INTRAVENOUS | Status: AC
  Administered 2020-02-04: 1 g via INTRAVENOUS
  Filled 2020-02-04 (×2): qty 100

## 2020-02-04 MED ORDER — PANTOPRAZOLE SODIUM 40 MG IV SOLR
40.0000 mg | Freq: Two times a day (BID) | INTRAVENOUS | Status: DC
  Administered 2020-02-04 – 2020-02-06 (×5): 40 mg via INTRAVENOUS
  Filled 2020-02-04 (×5): qty 40

## 2020-02-04 MED ORDER — IPRATROPIUM-ALBUTEROL 0.5-2.5 (3) MG/3ML IN SOLN
3.0000 mL | RESPIRATORY_TRACT | Status: DC
  Administered 2020-02-04 – 2020-02-07 (×15): 3 mL via RESPIRATORY_TRACT
  Filled 2020-02-04 (×15): qty 3

## 2020-02-04 MED ORDER — PROPOFOL 1000 MG/100ML IV EMUL
0.0000 ug/kg/min | INTRAVENOUS | Status: DC
  Administered 2020-02-04: 25 ug/kg/min via INTRAVENOUS
  Administered 2020-02-04: 20 ug/kg/min via INTRAVENOUS
  Administered 2020-02-05 (×4): 30 ug/kg/min via INTRAVENOUS
  Administered 2020-02-05: 30.071 ug/kg/min via INTRAVENOUS
  Administered 2020-02-06 (×2): 20 ug/kg/min via INTRAVENOUS
  Filled 2020-02-04 (×9): qty 100

## 2020-02-04 MED ORDER — THIAMINE HCL 100 MG/ML IJ SOLN
500.0000 mg | Freq: Every day | INTRAVENOUS | Status: DC
  Administered 2020-02-04 – 2020-02-06 (×3): 500 mg via INTRAVENOUS
  Filled 2020-02-04 (×4): qty 5

## 2020-02-04 MED FILL — Medication: Qty: 1 | Status: AC

## 2020-02-04 NOTE — Progress Notes (Signed)
CSW acknowledges consult for Substance Abuse resources and to obtain primary contact/Advance Directive information. Patient is currently intubated. CSW will continue to follow and offer resources when appropriate. Patient's niece is primary contact as listed in chart.   Alfonso Ramus, [place] [phone]

## 2020-02-04 NOTE — Progress Notes (Signed)
ANTICOAGULATION CONSULT NOTE  Pharmacy Consult for heparin Indication: chest pain/ACS/elevated troponin  No Known Allergies  Patient Measurements: Height: 6' (182.9 cm) Weight: 79.6 kg (175 lb 7.8 oz) IBW/kg (Calculated) : 77.6 Heparin Dosing Weight: 77.6 kg  Vital Signs: Temp: 99.7 F (37.6 C) (04/06 1700) Temp Source: Bladder (04/06 1200) BP: 123/60 (04/06 1700) Pulse Rate: 98 (04/06 1700)  Labs: Recent Labs    02/20/2020 1919 02/13/2020 1919 01/31/2020 2101 02/04/20 0037 02/04/20 0037 02/04/20 0601 02/04/20 0801 02/04/20 0841 02/04/20 1251 02/04/20 2125  HGB 13.8   < >  --  14.8   < >  --  14.6  --  14.2  --   HCT 41.4   < >  --  42.9  --   --  44.0  --  40.7  --   PLT 196  --   --  266  --   --   --   --   --   --   APTT  --   --   --   --   --   --   --  >160*  --   --   LABPROT  --   --   --   --   --   --   --  15.4*  --   --   INR  --   --   --   --   --   --   --  1.2  --   --   HEPARINUNFRC  --   --   --   --   --   --   --   --  0.89* 0.58  CREATININE 1.50*  --   --  1.26*  --  1.27*  --   --   --   --   TROPONINIHS 38*  --  3,472*  --   --  25,859*  --   --   --   --    < > = values in this interval not displayed.    Estimated Creatinine Clearance: 65.3 mL/min (A) (by C-G formula based on SCr of 1.27 mg/dL (H)).   Medical History: Past Medical History:  Diagnosis Date  . CHF (congestive heart failure) (HCC)    EF: 35-40%, global hypokinesis, mod. mitral regurgitation (03/2019)  . COPD (chronic obstructive pulmonary disease) (HCC)   . Diabetes mellitus, type 2 (HCC)   . Drug abuse (HCC)   . Dupuytren's contracture   . ETOH abuse 2018  . Hypertension   . Mixed hyperlipidemia 01/2018    Medications:  Scheduled:  . budesonide (PULMICORT) nebulizer solution  0.5 mg Nebulization BID  . Chlorhexidine Gluconate Cloth  6 each Topical Daily  . folic acid  1 mg Intravenous Daily  . insulin aspart  0-15 Units Subcutaneous Q4H  . ipratropium-albuterol  3 mL  Nebulization Q4H  . methylPREDNISolone (SOLU-MEDROL) injection  20 mg Intravenous BID  . pantoprazole (PROTONIX) IV  40 mg Intravenous BID  . thiamine  100 mg Oral Daily    Assessment: Patient arrived as a CODE BLUE s/p CPR w/ ROSC. Patient has a h/o COPD, CHF, DM, HTN. Patient's troponin on arrival 39 >> 3500 >> 25000. No anticoagulation PTA. Being started on heparin drip for concern of ACS/NSTEMI. Patient had received one dose of heparin 5000 units subq x 1. Baseline coags pending, CBC WNL.  Goal of Therapy:  Heparin level 0.3-0.7 units/ml Monitor platelets by anticoagulation protocol: Yes   Plan:  Reduce rate to 950 units/hr. CBC with am labs. Heparin level at 2000.   4/6: HL @ 2125 = 0.58 Will continue pt on current rate and recheck HL in 6 hrs.   Pharmacy will continue to monitor and adjust per consult.   Brandon Bolton 02/04/2020,10:22 PM

## 2020-02-04 NOTE — Progress Notes (Signed)
Initial Nutrition Assessment  DOCUMENTATION CODES:   Not applicable  INTERVENTION:   Once tube feeds initiated, recommend:  Vital 1.5 @40ml /hr + Prostat 14ml QID via tube   Free water flushes 80ml q4 hours to maintain tube patency   Propofol: 12.72 ml/hr- provides 335kcal/day   Regimen provides 2175kcal/day, 125g/day protein and 975ml/day free water  MVI, folic acid and thiamine in setting of etoh abuse  Pt at high refeed risk; recommend monitor K, Mg and P labs daily after tube feed intiation.   NUTRITION DIAGNOSIS:   Inadequate oral intake related to inability to eat(pt sedated and ventilated) as evidenced by NPO status.  GOAL:   Patient will meet greater than or equal to 90% of their needs  MONITOR:   PO intake, Supplement acceptance, Labs, Weight trends, I & O's, Skin  REASON FOR ASSESSMENT:   Ventilator    ASSESSMENT:   64yo male with PMX significant for HTN, ETOH abuse, polysubstance abuse, type 2 diabetes, COPD, and CHF who is admitted after cardiac arrest   Pt sedated and ventilated. OGT in place. RD suspects pt with poor appetite and oral intake at baseline r/t substance abuse. Per chart, pt appears fairly weight stable at baseline; however, pt's admit weight is ~25lbs up from his UBW. Pt is noted to have mild to moderate fat and muscle depletions today and is at high risk for developing malnutrition.   Medications reviewed and include: folic acid, insulin, solu-medrol, protonix, thiamine, unasyn, heparin, levophed, propofol, thiamine   Labs reviewed: Na 133(L), creat 1.27(H), Ca 6.9(L), P 5.7(H), Mg 1.9 wnl Wbc- 13.6(H) cbgs- 115, 127, 182 x 24 hrs AIC 5.7(H)- 4/5  Patient is currently intubated on ventilator support MV: 15.0 L/min Temp (24hrs), Avg:96.6 F (35.9 C), Min:94.3 F (34.6 C), Max:102 F (38.9 C)  Propofol: 12.72 ml/hr- provides 335kcal/day   MAP- 64-63mmHg  UOP- 84m  NUTRITION - FOCUSED PHYSICAL EXAM:    Most Recent Value   Orbital Region  No depletion  Upper Arm Region  Moderate depletion  Thoracic and Lumbar Region  No depletion  Buccal Region  No depletion  Temple Region  Moderate depletion  Clavicle Bone Region  Mild depletion  Clavicle and Acromion Bone Region  Mild depletion  Scapular Bone Region  No depletion  Dorsal Hand  No depletion  Patellar Region  Moderate depletion  Anterior Thigh Region  Moderate depletion  Posterior Calf Region  Moderate depletion  Edema (RD Assessment)  None  Hair  Reviewed  Eyes  Reviewed  Mouth  Reviewed  Skin  Reviewed  Nails  Reviewed     Diet Order:   Diet Order            Diet NPO time specified  Diet effective now             EDUCATION NEEDS:   No education needs have been identified at this time  Skin:  Skin Assessment: Reviewed RN Assessment  Last BM:  pta  Height:   Ht Readings from Last 1 Encounters:  02/20/2020 6' (1.829 m)    Weight:   Wt Readings from Last 1 Encounters:  02/04/20 79.6 kg    Ideal Body Weight:  80.9 kg  BMI:  Body mass index is 23.8 kg/m.  Estimated Nutritional Needs:   Kcal:  2317kcal/day  Protein:  110-130g/day  Fluid:  >2.4L/day  04/05/20 MS, RD, LDN Please refer to [facility] for RD and/or RD on-call/weekend/after hours pager

## 2020-02-04 NOTE — Consult Note (Signed)
CARDIOLOGY CONSULT NOTE               Patient ID: BECKHAM CAPISTRAN MRN: [id number] DOB/AGE: 02-May-1956 64 y.o.  Admit date: 02/20/2020 Referring Physician Erin Fulling, MD Primary Physician  Primary Cardiologist Fath, MD, Marga Hoots, PA-C Reason for Consultation cardiac arrest  HPI: 64 year old gentleman referred for evaluation of cardiac arrest. 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 The patient was transported to [facility] ER yesterday by EMS after being found unresponsive, receiving 3 rounds of epinephrine and CPR with ROSC, then became pulseless again en route to ER. Upon arrival, he was in PEA, received epinephrine and bicarb, with ROSC. The patient is currently intubated and sedated, thus history is obtained from hospital notes. The patient reportedly was noted to be in respiratory distress, attempting to use his inhaler, but fell down stairs, at which time EMS was called, and the patient was found unresponsive. In the ER, the patient was started on vasopressors. Labs notable for ETOH 47, high sensitivity troponin 38, 3472, followed by 25859, K 5.5, Na 125, creatinine 1.5, elevated lactic acid, elevated transaminases. ECG revealed sinus rhythm at a rate of 88 bpm with known LBBB. Chest xray negative for acute cardiopulmonary disease. Head CT negative for acute abnormality.  Review of systems unable to be obtained due to sedation and ventilator support.    Past Medical History:  Diagnosis Date  . CHF (congestive heart failure) (HCC)    EF: 35-40%, global hypokinesis, mod. mitral regurgitation (03/2019)  . COPD (chronic obstructive pulmonary disease) (HCC)   . Diabetes mellitus, type 2 (HCC)   . Drug abuse (HCC)   . Dupuytren's contracture   . ETOH abuse 2018  . Hypertension   . Mixed hyperlipidemia 01/2018    Past Surgical History:  Procedure  Laterality Date  . HAND SURGERY      Medications Prior to Admission  Medication Sig Dispense Refill Last Dose  . amLODipine (NORVASC) 10 MG tablet Take 1 tablet (10 mg total) by mouth daily. 90 tablet 2   . carvedilol (COREG) 6.25 MG tablet Take 1 tablet (6.25 mg total) by mouth 2 (two) times daily. 60 tablet 11   . cetirizine (ZYRTEC) 10 MG tablet Take 1 tablet by mouth once daily 30 tablet 0   . hydrOXYzine (ATARAX/VISTARIL) 25 MG tablet Take 25 mg by mouth 2 (two) times daily as needed.     Marland Kitchen losartan (COZAAR) 25 MG tablet Take 1 tablet (25 mg total) by mouth daily. 30 tablet 11   . SYMBICORT 160-4.5 MCG/ACT inhaler Inhale 2 puffs into the lungs 2 (two) times daily. 11 g 1   . tiotropium (SPIRIVA) 18 MCG inhalation capsule Place 18 mcg into inhaler and inhale daily.     . diphenhydrAMINE (BENADRYL) 25 mg capsule Take 25 mg by mouth every 6 (six) hours as needed for itching, allergies or sleep.    PRN at PRN  . varenicline (CHANTIX STARTING MONTH PAK) 0.5 MG X 11 & 1 MG X 42 tablet Take one 0.5 mg tablet by mouth once daily for 3 days, then increase to one 0.5 mg tablet twice daily for 4 days, then increase to one 1 mg tablet twice daily. (Patient not taking: Reported on 02/02/2020) 53 tablet 0 Not Taking at Unknown time  . VENTOLIN HFA 108 (90 Base) MCG/ACT inhaler INHALE 2 PUFFS BY MOUTH EVERY 6 HOURS AS  NEEDED FOR WHEEZING OR SHORTNESS OF BREATH (Patient not taking: Reported on 02/25/2020) 18 each 1 PRN at PRN   Social History   Socioeconomic History  . Marital status: Single    Spouse name: Not on file  . Number of children: Not on file  . Years of education: Not on file  . Highest education level: Not on file  Occupational History  . Not on file  Tobacco Use  . Smoking status: Current Every Day Smoker    Packs/day: 1.00    Years: 40.00    Pack years: 40.00    Types: Cigarettes  . Smokeless tobacco: Never Used  Substance and Sexual Activity  . Alcohol use: No    Comment: 2 quarts  of beer/day  . Drug use: No    Types: Heroin    Comment: former heroin user (inj)  . Sexual activity: Never  Other Topics Concern  . Not on file  Social History Narrative   [place] [address] Consent Collected   Social Determinants of Health   Financial Resource Strain:   . Difficulty of Paying Living Expenses:   Food Insecurity:   . Worried About Programme researcher, broadcasting/film/video in the Last Year:   . Barista in the Last Year:   Transportation Needs:   . Freight forwarder (Medical):   Marland Kitchen Lack of Transportation (Non-Medical):   Physical Activity:   . Days of Exercise per Week:   . Minutes of Exercise per Session:   Stress:   . Feeling of Stress :   Social Connections:   . Frequency of Communication with Friends and Family:   . Frequency of Social Gatherings with Friends and Family:   . Attends Religious Services:   . Active Member of Clubs or Organizations:   . Attends Banker Meetings:   Marland Kitchen Marital Status:   Intimate Partner Violence:   . Fear of Current or Ex-Partner:   . Emotionally Abused:   Marland Kitchen Physically Abused:   . Sexually Abused:     Family History  Problem Relation Age of Onset  . Hypertension Father   . Heart disease Father   . Arthritis Father   . Cancer Father   . Allergic Disorder Mother       Review of systems complete and found to be negative unless listed above      PHYSICAL EXAM  General: Critically ill appearing male patient, intubated and sedated HEENT:  Normocephalic and atramatic Lungs: ventilator support Heart: HRRR . Normal S1 and S2 without gallops or murmurs.  Msk:  No obvious deformity. strength not able to be assessed. Extremities: No clubbing, cyanosis or edema.   Neuro: sedated Psych:  sedated  Labs:   Lab Results  Component Value Date   WBC 13.6 (H) 02/04/2020   HGB 14.8 02/04/2020   HCT 42.9 02/04/2020   MCV 96.4 02/04/2020   PLT 266 02/04/2020    Recent Labs  Lab 02/20/2020 1919 02/04/20 0037 02/04/20 0601  NA  125*   < > 133*  K 5.5*   < > 4.3  CL 90*   < > 101  CO2 19*   < > 24  BUN 9   < > 14  CREATININE 1.50*   < > 1.27*  CALCIUM 8.2*   < > 6.9*  PROT 5.8*  --   --   BILITOT 0.7  --   --   ALKPHOS 69  --   --   ALT 64*  --   --  AST 93*  --   --   GLUCOSE 366*   < > 124*   < > = values in this interval not displayed.   Lab Results  Component Value Date   TROPONINI 0.12 (HH) 03/06/2019    Lab Results  Component Value Date   CHOL 214 (H) 11/21/2018   CHOL 130 02/13/2018   CHOL 178 06/01/2017   Lab Results  Component Value Date   HDL 145 11/21/2018   HDL 39 (L) 02/13/2018   HDL 56 06/01/2017   Lab Results  Component Value Date   LDLCALC 53 11/21/2018   LDLCALC 67 02/13/2018   LDLCALC 94 06/01/2017   Lab Results  Component Value Date   TRIG 284 (H) 02/04/2020   TRIG 80 11/21/2018   TRIG 122 02/13/2018   Lab Results  Component Value Date   CHOLHDL 1.5 11/21/2018   CHOLHDL 3.3 02/13/2018   CHOLHDL 3.2 06/01/2017   No results found for: LDLDIRECT    Radiology: DG Abdomen 1 View  Result Date: 02/19/2020 CLINICAL DATA:  OG tube placement EXAM: ABDOMEN - 1 VIEW COMPARISON:  None. FINDINGS: OG tube tip is in the stomach. Nonobstructive bowel gas pattern. Visualized lung bases clear. IMPRESSION: OG tube tip in the stomach. Electronically Signed   By: Charlett Nose M.D.   On: 02/15/2020 19:47   CT Head Wo Contrast  Result Date: 02/15/2020 CLINICAL DATA:  64 year old male with cardiac arrest and encephalopathy. EXAM: CT HEAD WITHOUT CONTRAST CT CERVICAL SPINE WITHOUT CONTRAST TECHNIQUE: Multidetector CT imaging of the head and cervical spine was performed following the standard protocol without intravenous contrast. Multiplanar CT image reconstructions of the cervical spine were also generated. COMPARISON:  None. FINDINGS: Evaluation of this exam is limited due to motion artifact. CT HEAD FINDINGS Brain: There is mild age-related atrophy and chronic microvascular ischemic  changes. Probable small old left caudate head infarct as well as focal area of old infarct involving the right cerebellum. There is no acute intracranial hemorrhage. No mass effect or midline shift. No extra-axial fluid collection. Vascular: No hyperdense vessel or unexpected calcification. Skull: Normal. Negative for fracture or focal lesion. Sinuses/Orbits: There is diffuse mucoperiosteal thickening of paranasal sinuses. The mastoid air cells are clear. Other: None CT CERVICAL SPINE FINDINGS Alignment: No acute subluxation. There is mild reversal of normal cervical lordosis which may be positional or due to muscle spasm or secondary to degenerative changes. Skull base and vertebrae: No definite acute fracture. Soft tissues and spinal canal: No prevertebral fluid or swelling. No visible canal hematoma. Disc levels: Multilevel degenerative changes with endplate irregularity and disc space narrowing. Upper chest: Left apical subpleural density, likely atelectasis. Other: Partially visualized endotracheal and enteric tube as well as right IJ central venous line. Bilateral carotid bulb calcified plaques. IMPRESSION: 1. No acute intracranial pathology. 2. Age-related atrophy and chronic microvascular ischemic changes. Old right cerebellar infarct. 3. No acute/traumatic cervical spine pathology. Electronically Signed   By: Elgie Collard M.D.   On: 02/16/2020 23:53   CT Cervical Spine Wo Contrast  Result Date: 01/30/2020 CLINICAL DATA:  64 year old male with cardiac arrest and encephalopathy. EXAM: CT HEAD WITHOUT CONTRAST CT CERVICAL SPINE WITHOUT CONTRAST TECHNIQUE: Multidetector CT imaging of the head and cervical spine was performed following the standard protocol without intravenous contrast. Multiplanar CT image reconstructions of the cervical spine were also generated. COMPARISON:  None. FINDINGS: Evaluation of this exam is limited due to motion artifact. CT HEAD FINDINGS Brain: There is mild age-related  atrophy and chronic microvascular ischemic changes. Probable small old left caudate head infarct as well as focal area of old infarct involving the right cerebellum. There is no acute intracranial hemorrhage. No mass effect or midline shift. No extra-axial fluid collection. Vascular: No hyperdense vessel or unexpected calcification. Skull: Normal. Negative for fracture or focal lesion. Sinuses/Orbits: There is diffuse mucoperiosteal thickening of paranasal sinuses. The mastoid air cells are clear. Other: None CT CERVICAL SPINE FINDINGS Alignment: No acute subluxation. There is mild reversal of normal cervical lordosis which may be positional or due to muscle spasm or secondary to degenerative changes. Skull base and vertebrae: No definite acute fracture. Soft tissues and spinal canal: No prevertebral fluid or swelling. No visible canal hematoma. Disc levels: Multilevel degenerative changes with endplate irregularity and disc space narrowing. Upper chest: Left apical subpleural density, likely atelectasis. Other: Partially visualized endotracheal and enteric tube as well as right IJ central venous line. Bilateral carotid bulb calcified plaques. IMPRESSION: 1. No acute intracranial pathology. 2. Age-related atrophy and chronic microvascular ischemic changes. Old right cerebellar infarct. 3. No acute/traumatic cervical spine pathology. Electronically Signed   By: Elgie CollardArash  Radparvar M.D.   On: 01/30/2020 23:53   DG Chest Port 1 View  Result Date: 02/04/2020 CLINICAL DATA:  Intubation EXAM: PORTABLE CHEST 1 VIEW COMPARISON:  Yesterday FINDINGS: The endotracheal tube tip is halfway between the clavicular heads and carina. The enteric tube reaches the stomach. Right IJ line with tip at the upper SVC. Interstitial coarsening with mildly improved aeration from before. No effusion or apical pneumothorax. More lucent appearance at the bases is symmetric and attributed to angle of imaging. Normal heart size. IMPRESSION: 1.  Unremarkable hardware positioning. 2. Mild improvement in lung volumes. Electronically Signed   By: Marnee SpringJonathon  Watts M.D.   On: 02/04/2020 04:51   DG Chest Portable 1 View  Result Date: 02/06/2020 CLINICAL DATA:  Central line placement EXAM: PORTABLE CHEST 1 VIEW COMPARISON:  02/22/2020 FINDINGS: Right central line tip is in the SVC. No pneumothorax. Endotracheal tube and NG tube are unchanged. Heart is normal size. Increasing bibasilar opacities, likely atelectasis. No effusions. No acute bony abnormality. IMPRESSION: Right central line tip in the SVC.  No pneumothorax. Bibasilar atelectasis. Electronically Signed   By: Charlett NoseKevin  Dover M.D.   On: 02/15/2020 21:08   DG Chest Portable 1 View  Result Date: 01/30/2020 CLINICAL DATA:  Cardiac arrest EXAM: PORTABLE CHEST 1 VIEW COMPARISON:  05/15/2019 FINDINGS: Endotracheal to is 2.5 cm above the carina. NG tube enters the stomach. Heart is normal size. No confluent opacities or effusions. No pneumothorax. No acute bony abnormality. IMPRESSION: No acute cardiopulmonary disease. Electronically Signed   By: Charlett NoseKevin  Dover M.D.   On: 02/08/2020 19:46    EKG: sinus rhythm, rate 88 bpm  ASSESSMENT AND PLAN:  1. Cardiac arrest secondary to NSTEMI and acute respiratory failure, on vasopressors and ventilator support. Unknown downtime. Multiple rounds of epi and CPR with ROSC, then pulseless, received epi and bicarb in ER with ROSC again. ECG reveals sinus rhythm with known LBBB. Troponin elevated to >25,000. Started on heparin drip. Patient's neurological status uncertain. 2. Severe acute respiratory failure with history of CHF and COPD, on vent support 3. Alcoholic cardiomyopathy, diagnosed 05/2019, EF 30-35%, ETOH on admission 47 4. Chronic systolic CHF, chest xray negative for pulmonary edema  Recommendations: 1. Agree with current therapy 2. Obtain 2D echocardiogram 3. Continue heparin drip for 48-72 hours 4. Defer further cardiac diagnostics at this time. As  patient's  neurological status is determined, will consider cardiac catheterization.  Signed: Clabe Seal PA-C 02/04/2020, 9:09 AM Discussed with Dr. Saralyn Pilar; plan made in collaboration with him.

## 2020-02-04 NOTE — Progress Notes (Signed)
ANTICOAGULATION CONSULT NOTE  Pharmacy Consult for heparin Indication: chest pain/ACS/elevated troponin  No Known Allergies  Patient Measurements: Height: 6' (182.9 cm) Weight: 79.6 kg (175 lb 7.8 oz) IBW/kg (Calculated) : 77.6 Heparin Dosing Weight: 77.6 kg  Vital Signs: Temp: 99.7 F (37.6 C) (04/06 1700) Temp Source: Bladder (04/06 1200) BP: 123/60 (04/06 1700) Pulse Rate: 98 (04/06 1700)  Labs: Recent Labs    02/20/2020 1919 02/11/2020 1919 02/25/2020 2101 02/04/20 0037 02/04/20 0601 02/04/20 0841 02/04/20 1251  HGB 13.8   < >  --  14.8  --   --  14.2  HCT 41.4  --   --  42.9  --   --  40.7  PLT 196  --   --  266  --   --   --   APTT  --   --   --   --   --  >160*  --   LABPROT  --   --   --   --   --  15.4*  --   INR  --   --   --   --   --  1.2  --   HEPARINUNFRC  --   --   --   --   --   --  0.89*  CREATININE 1.50*  --   --  1.26* 1.27*  --   --   TROPONINIHS 38*  --  3,472*  --  25,859*  --   --    < > = values in this interval not displayed.    Estimated Creatinine Clearance: 65.3 mL/min (A) (by C-G formula based on SCr of 1.27 mg/dL (H)).   Medical History: Past Medical History:  Diagnosis Date  . CHF (congestive heart failure) (HCC)    EF: 35-40%, global hypokinesis, mod. mitral regurgitation (03/2019)  . COPD (chronic obstructive pulmonary disease) (HCC)   . Diabetes mellitus, type 2 (HCC)   . Drug abuse (HCC)   . Dupuytren's contracture   . ETOH abuse 2018  . Hypertension   . Mixed hyperlipidemia 01/2018    Medications:  Scheduled:  . budesonide (PULMICORT) nebulizer solution  0.5 mg Nebulization BID  . Chlorhexidine Gluconate Cloth  6 each Topical Daily  . folic acid  1 mg Intravenous Daily  . insulin aspart  0-15 Units Subcutaneous Q4H  . ipratropium-albuterol  3 mL Nebulization Q4H  . methylPREDNISolone (SOLU-MEDROL) injection  20 mg Intravenous BID  . pantoprazole (PROTONIX) IV  40 mg Intravenous BID  . thiamine  100 mg Oral Daily     Assessment: Patient arrived as a CODE BLUE s/p CPR w/ ROSC. Patient has a h/o COPD, CHF, DM, HTN. Patient's troponin on arrival 39 >> 3500 >> 25000. No anticoagulation PTA. Being started on heparin drip for concern of ACS/NSTEMI. Patient had received one dose of heparin 5000 units subq x 1. Baseline coags pending, CBC WNL.  Goal of Therapy:  Heparin level 0.3-0.7 units/ml Monitor platelets by anticoagulation protocol: Yes   Plan:  Reduce rate to 950 units/hr. CBC with am labs. Heparin level at 2000.   Pharmacy will continue to monitor and adjust per consult.   MLS 02/04/2020,5:41 PM

## 2020-02-04 NOTE — Progress Notes (Signed)
ANTICOAGULATION CONSULT NOTE - Initial Consult  Pharmacy Consult for heparin Indication: chest pain/ACS/elevated troponin  No Known Allergies  Patient Measurements: Height: 6' (182.9 cm) Weight: 84.8 kg (186 lb 15.2 oz) IBW/kg (Calculated) : 77.6 Heparin Dosing Weight: 77.6 kg  Vital Signs: Temp: 99.7 F (37.6 C) (04/06 0600) Temp Source: Bladder (04/06 0000) BP: 96/61 (04/06 0600) Pulse Rate: 82 (04/06 0600)  Labs: Recent Labs    02/06/2020 1919 02/20/2020 2101 02/04/20 0037 02/04/20 0601  HGB 13.8  --  14.8  --   HCT 41.4  --  42.9  --   PLT 196  --  266  --   CREATININE 1.50*  --  1.26* 1.27*  TROPONINIHS 38* 3,472*  --  25,859*    Estimated Creatinine Clearance: 65.3 mL/min (A) (by C-G formula based on SCr of 1.27 mg/dL (H)).   Medical History: Past Medical History:  Diagnosis Date  . CHF (congestive heart failure) (HCC)    EF: 35-40%, global hypokinesis, mod. mitral regurgitation (03/2019)  . COPD (chronic obstructive pulmonary disease) (HCC)   . Diabetes mellitus, type 2 (HCC)   . Drug abuse (HCC)   . Dupuytren's contracture   . ETOH abuse 2018  . Hypertension   . Mixed hyperlipidemia 01/2018    Medications:  Scheduled:  . budesonide (PULMICORT) nebulizer solution  0.25 mg Nebulization BID  . Chlorhexidine Gluconate Cloth  6 each Topical Daily  . folic acid  1 mg Oral Daily  . heparin  4,000 Units Intravenous Once  . insulin aspart  0-15 Units Subcutaneous Q4H  . ipratropium-albuterol  3 mL Nebulization Q6H  . methylPREDNISolone (SOLU-MEDROL) injection  60 mg Intravenous Q6H  . midazolam  2 mg Intravenous Once  . multivitamin with minerals  1 tablet Oral Daily  . pantoprazole (PROTONIX) IV  40 mg Intravenous Q24H  . thiamine  100 mg Oral Daily    Assessment: Patient arrived as a CODE BLUE s/p CPR w/ ROSC. Patient has a h/o COPD, CHF, DM, HTN. Patient's troponin on arrival 39 >> 3500 >> 25000. No anticoagulation PTA. Being started on heparin drip for  concern of ACS/NSTEMI. Patient had received one dose of heparin 5000 units subq x 1. Baseline coags pending, CBC WNL.  Goal of Therapy:  Heparin level 0.3-0.7 units/ml Monitor platelets by anticoagulation protocol: Yes   Plan:  Will bolus heparin 4000 units IV x 1 Will start rate at 1100 units/hr Will check anti-Xa at 1300. Will monitor daily CBC's and adjust per anti-Xa levels.  Thomasene Ripple, PharmD, BCPS Clinical Pharmacist 02/04/2020,7:20 AM

## 2020-02-05 LAB — URINE CULTURE: Culture: NO GROWTH

## 2020-02-05 LAB — BLOOD GAS, ARTERIAL
Acid-base deficit: 0.2 mmol/L (ref 0.0–2.0)
Bicarbonate: 28.3 mmol/L — ABNORMAL HIGH (ref 20.0–28.0)
FIO2: 0.28
MECHVT: 500 mL
O2 Saturation: 74.2 %
PEEP: 5 cmH2O
Patient temperature: 37
RATE: 22 resp/min
pCO2 arterial: 63 mmHg — ABNORMAL HIGH (ref 32.0–48.0)
pH, Arterial: 7.26 — ABNORMAL LOW (ref 7.350–7.450)
pO2, Arterial: 46 mmHg — ABNORMAL LOW (ref 83.0–108.0)

## 2020-02-05 LAB — TROPONIN I (HIGH SENSITIVITY): Troponin I (High Sensitivity): 5567 ng/L (ref <18)

## 2020-02-05 LAB — BASIC METABOLIC PANEL
Anion gap: 11 (ref 5–15)
BUN: 17 mg/dL (ref 8–23)
CO2: 24 mmol/L (ref 22–32)
Calcium: 7.1 mg/dL — ABNORMAL LOW (ref 8.9–10.3)
Chloride: 96 mmol/L — ABNORMAL LOW (ref 98–111)
Creatinine, Ser: 1.19 mg/dL (ref 0.61–1.24)
GFR calc Af Amer: >60 mL/min (ref >60)
GFR calc non Af Amer: >60 mL/min (ref >60)
Glucose, Bld: 156 mg/dL — ABNORMAL HIGH (ref 70–99)
Potassium: 4.2 mmol/L (ref 3.5–5.1)
Sodium: 131 mmol/L — ABNORMAL LOW (ref 135–145)

## 2020-02-05 LAB — GLUCOSE, CAPILLARY
Glucose-Capillary: 123 mg/dL — ABNORMAL HIGH (ref 70–99)
Glucose-Capillary: 129 mg/dL — ABNORMAL HIGH (ref 70–99)
Glucose-Capillary: 142 mg/dL — ABNORMAL HIGH (ref 70–99)
Glucose-Capillary: 144 mg/dL — ABNORMAL HIGH (ref 70–99)
Glucose-Capillary: 149 mg/dL — ABNORMAL HIGH (ref 70–99)
Glucose-Capillary: 161 mg/dL — ABNORMAL HIGH (ref 70–99)
Glucose-Capillary: 163 mg/dL — ABNORMAL HIGH (ref 70–99)
Glucose-Capillary: 173 mg/dL — ABNORMAL HIGH (ref 70–99)

## 2020-02-05 LAB — HEMOGLOBIN AND HEMATOCRIT, BLOOD
HCT: 39.1 % (ref 39.0–52.0)
HCT: 39.1 % (ref 39.0–52.0)
Hemoglobin: 13.5 g/dL (ref 13.0–17.0)
Hemoglobin: 13.5 g/dL (ref 13.0–17.0)

## 2020-02-05 LAB — TRIGLYCERIDES: Triglycerides: 253 mg/dL — ABNORMAL HIGH (ref <150)

## 2020-02-05 LAB — HEPARIN LEVEL (UNFRACTIONATED): Heparin Unfractionated: 0.52 IU/mL (ref 0.30–0.70)

## 2020-02-05 LAB — LACTIC ACID, PLASMA
Lactic Acid, Venous: 3 mmol/L (ref 0.5–1.9)
Lactic Acid, Venous: 3.6 mmol/L (ref 0.5–1.9)

## 2020-02-05 LAB — MAGNESIUM: Magnesium: 2.3 mg/dL (ref 1.7–2.4)

## 2020-02-05 LAB — PHOSPHORUS: Phosphorus: 4.2 mg/dL (ref 2.5–4.6)

## 2020-02-05 MED ORDER — PHENOBARBITAL SODIUM 65 MG/ML IJ SOLN: 65.0000 mg | Freq: Two times a day (BID) | INTRAMUSCULAR | Status: DC

## 2020-02-05 MED ORDER — PHENOBARBITAL SODIUM 65 MG/ML IJ SOLN: 32.5000 mg | Freq: Once | INTRAMUSCULAR | Status: DC

## 2020-02-05 MED ORDER — PHENOBARBITAL SODIUM 65 MG/ML IJ SOLN
65.0000 mg | Freq: Three times a day (TID) | INTRAMUSCULAR | Status: DC
  Administered 2020-02-07: 65 mg via INTRAVENOUS
  Filled 2020-02-05: qty 1

## 2020-02-05 MED ORDER — LORAZEPAM 2 MG/ML IJ SOLN
1.0000 mg | INTRAMUSCULAR | Status: DC | PRN
  Administered 2020-02-06 – 2020-02-07 (×3): 2 mg via INTRAVENOUS
  Filled 2020-02-05 (×3): qty 1

## 2020-02-05 MED ORDER — PHENOBARBITAL SODIUM 65 MG/ML IJ SOLN
65.0000 mg | Freq: Four times a day (QID) | INTRAMUSCULAR | Status: DC
  Administered 2020-02-05 – 2020-02-06 (×6): 65 mg via INTRAVENOUS
  Filled 2020-02-05 (×6): qty 1

## 2020-02-05 MED ORDER — PHENOBARBITAL SODIUM 65 MG/ML IJ SOLN: 32.5000 mg | Freq: Two times a day (BID) | INTRAMUSCULAR | Status: DC

## 2020-02-05 NOTE — Progress Notes (Signed)
CRITICAL CARE NOTE Acute respiratory failure due to NSTEMI and acute COPD exacerbation s/p cardiac arrest with multiorgan failure with acute systolic CHF exacerbation  SIGNIFICANT EVENTS 4/5 admitted for cardiac arrest 4/6 severe resp failure 4/7 remains critically ill, multiorgan failure     CC  follow up respiratory failure  SUBJECTIVE Patient remains critically ill Prognosis is guarded +NSTEMI Severe COPD Multiorgan failure   BP 119/62   Pulse (!) 113   Temp 99.3 F (37.4 C)   Resp (!) 23   Ht 6' (1.829 m)   Wt 80.6 kg   SpO2 92%   BMI 24.10 kg/m    I/O last 3 completed shifts: In: 2694.9 [I.V.:1783.3; IV Piggyback:911.6] Out: 0102 [VOZDG:6440; Emesis/NG output:350; Other:250] No intake/output data recorded.  SpO2: 92 % FiO2 (%): 30 %  Estimated body mass index is 24.1 kg/m as calculated from the following:   Height as of this encounter: 6' (1.829 m).   Weight as of this encounter: 80.6 kg.   REVIEW OF SYSTEMS  PATIENT IS UNABLE TO PROVIDE COMPLETE REVIEW OF SYSTEMS DUE TO SEVERE CRITICAL ILLNESS        PHYSICAL EXAMINATION:  GENERAL:critically ill appearing, +resp distress HEAD: Normocephalic, atraumatic.  EYES: Pupils equal, round, reactive to light.  No scleral icterus.  MOUTH: Moist mucosal membrane. NECK: Supple.  PULMONARY: +rhonchi, +wheezing CARDIOVASCULAR: S1 and S2. Regular rate and rhythm. No murmurs, rubs, or gallops.  GASTROINTESTINAL: Soft, nontender, -distended.  Positive bowel sounds.   MUSCULOSKELETAL: No swelling, clubbing, or edema.  NEUROLOGIC: obtunded, GCS<8 SKIN:intact,warm,dry  MEDICATIONS: I have reviewed all medications and confirmed regimen as documented   CULTURE RESULTS   Recent Results (from the past 240 hour(s))  Respiratory Panel by RT PCR (Flu A&B, Covid) - Nasopharyngeal Swab     Status: None   Collection Time: 02/06/2020  7:20 PM   Specimen: Nasopharyngeal Swab  Result Value Ref Range Status   SARS  Coronavirus 2 by RT PCR NEGATIVE NEGATIVE Final    Comment: (NOTE) SARS-CoV-2 target nucleic acids are NOT DETECTED. The SARS-CoV-2 RNA is generally detectable in upper respiratoy specimens during the acute phase of infection. The lowest concentration of SARS-CoV-2 viral copies this assay can detect is 131 copies/mL. A negative result does not preclude SARS-Cov-2 infection and should not be used as the sole basis for treatment or other patient management decisions. A negative result may occur with  improper specimen collection/handling, submission of specimen other than nasopharyngeal swab, presence of viral mutation(s) within the areas targeted by this assay, and inadequate number of viral copies (<131 copies/mL). A negative result must be combined with clinical observations, patient history, and epidemiological information. The expected result is Negative. Fact Sheet for Patients:  PinkCheek.be Fact Sheet for Healthcare Providers:  GravelBags.it This test is not yet ap proved or cleared by the [place] FDA and  has been authorized for detection and/or diagnosis of SARS-CoV-2 by FDA under an Emergency Use Authorization (EUA). This EUA will remain  in effect (meaning this test can be used) for the duration of the COVID-19 declaration under Section 564(b)(1) of the Act, 21 U.S.C. section 360bbb-3(b)(1), unless the authorization is terminated or revoked sooner.    Influenza A by PCR NEGATIVE NEGATIVE Final   Influenza B by PCR NEGATIVE NEGATIVE Final    Comment: (NOTE) The Xpert Xpress SARS-CoV-2/FLU/RSV assay is intended as an aid in  the diagnosis of influenza from Nasopharyngeal swab specimens and  should not be used as a sole basis for  treatment. Nasal washings and  aspirates are unacceptable for Xpert Xpress SARS-CoV-2/FLU/RSV  testing. Fact Sheet for Patients: PinkCheek.be Fact Sheet  for Healthcare Providers: GravelBags.it This test is not yet approved or cleared by the [place] FDA and  has been authorized for detection and/or diagnosis of SARS-CoV-2 by  FDA under an Emergency Use Authorization (EUA). This EUA will remain  in effect (meaning this test can be used) for the duration of the  Covid-19 declaration under Section 564(b)(1) of the Act, 21  U.S.C. section 360bbb-3(b)(1), unless the authorization is  terminated or revoked. Performed at [facility], [address] [place], [place] [address]   Blood culture (routine x 2)     Status: None (Preliminary result)   Collection Time: 02/24/2020  9:00 PM   Specimen: BLOOD  Result Value Ref Range Status   Specimen Description BLOOD BLOOD RIGHT FOREARM  Final   Special Requests   Final    BOTTLES DRAWN AEROBIC AND ANAEROBIC Blood Culture results may not be optimal due to an inadequate volume of blood received in culture bottles   Culture   Final    NO GROWTH < 12 HOURS Performed at [facility], [address], [place], [place] [address]    Report Status PENDING  Incomplete  Blood culture (routine x 2)     Status: None (Preliminary result)   Collection Time: 02/28/2020  9:01 PM   Specimen: BLOOD  Result Value Ref Range Status   Specimen Description BLOOD BLOOD LEFT HAND  Final   Special Requests   Final    BOTTLES DRAWN AEROBIC AND ANAEROBIC Blood Culture results may not be optimal due to an inadequate volume of blood received in culture bottles   Culture   Final    NO GROWTH < 12 HOURS Performed at [facility], [address], [place], [place] [address]    Report Status PENDING  Incomplete  MRSA PCR Screening     Status: None   Collection Time: 02/04/20 12:02 AM   Specimen: Nasopharyngeal  Result Value Ref Range Status   MRSA by PCR NEGATIVE NEGATIVE Final    Comment:        The GeneXpert MRSA Assay (FDA approved for NASAL specimens only), is  one component of a comprehensive MRSA colonization surveillance program. It is not intended to diagnose MRSA infection nor to guide or monitor treatment for MRSA infections. Performed at [facility], [address] [place], [place] [address]   Culture, respiratory     Status: None (Preliminary result)   Collection Time: 02/04/20 11:00 AM   Specimen: Tracheal Aspirate  Result Value Ref Range Status   Specimen Description   Final    TRACHEAL ASPIRATE Performed at [facility], [address], [place], [place] [address]    Special Requests   Final    NONE Performed at [facility], [address] [place], [place] [address]    Gram Stain   Final    ABUNDANT WBC PRESENT,BOTH PMN AND MONONUCLEAR NO ORGANISMS SEEN Performed at [facility], [place] [address], [place], [place] [address]    Culture PENDING  Incomplete   Report Status PENDING  Incomplete          IMAGING    ECHOCARDIOGRAM COMPLETE  Result Date: 02/04/2020    ECHOCARDIOGRAM REPORT   Patient Name:   Brandon Bolton Maulden Date of Exam: 02/04/2020 Medical Rec #:  [id number]   Height:  72.0 in Accession #:    [id number]  Weight:       186.9 lb Date of Birth:  Jun 17, 1956   BSA:          2.070 m Patient Age:    10 years    BP:           96/61 mmHg Patient Gender: M           HR:           82 bpm. Exam Location:  [facility] Procedure: 2D Echo and Intracardiac Opacification Agent Indications:     Cardiac Arrest  History:         Patient has prior history of Echocardiogram examinations. CHF,                  COPD; Risk Factors:Hypertension, Diabetes and Dyslipidemia.  Sonographer:     Tillie Rung [place] Referring Phys:  [id number] Loree Fee F DAVIS Diagnosing Phys: Nelva Bush MD  Sonographer Comments: Image acquisition challenging due to COPD. IMPRESSIONS  1. Left ventricular ejection fraction, by estimation, is 35 to 40%. The left ventricle has moderately decreased function. Left ventricular endocardial  border not optimally defined to evaluate regional wall motion. There is mild asymmetric left ventricular hypertrophy of the basal-septal segment. Left ventricular diastolic parameters are consistent with Grade I diastolic dysfunction (impaired relaxation). Elevated left atrial pressure.  2. Right ventricular systolic function is mildly reduced. The right ventricular size is normal. Tricuspid regurgitation signal is inadequate for assessing PA pressure.  3. The mitral valve was not well visualized. Mild mitral valve regurgitation. No evidence of mitral stenosis.  4. The aortic valve was not well visualized. Aortic valve regurgitation is mild to moderate. No aortic stenosis is present.  5. Pulmonic valve regurgitation not well assessed.  6. The inferior vena cava is normal in size with greater than 50% respiratory variability, suggesting right atrial pressure of 3 mmHg. FINDINGS  Left Ventricle: Left ventricular ejection fraction, by estimation, is 35 to 40%. The left ventricle has moderately decreased function. Left ventricular endocardial border not optimally defined to evaluate regional wall motion. Definity contrast agent was given IV to delineate the left ventricular endocardial borders. The left ventricular internal cavity size was normal in size. There is mild asymmetric left ventricular hypertrophy of the basal-septal segment. Abnormal (paradoxical) septal motion, consistent with left bundle branch block. Left ventricular diastolic parameters are consistent with Grade I diastolic dysfunction (impaired relaxation). Elevated left atrial pressure.  LV Wall Scoring: The mid and distal anterior wall, mid inferoseptal segment, and apical septal segment are akinetic. The entire lateral wall, anterior septum, entire inferior wall, basal anterior segment, and basal inferoseptal segment are hypokinetic. Right Ventricle: The right ventricular size is normal. No increase in right ventricular wall thickness. Right  ventricular systolic function is mildly reduced. Tricuspid regurgitation signal is inadequate for assessing PA pressure. Left Atrium: Left atrial size was normal in size. Right Atrium: Right atrial size was not well visualized. Pericardium: There is no evidence of pericardial effusion. Mitral Valve: The mitral valve was not well visualized. Mild mitral annular calcification. Mild mitral valve regurgitation. No evidence of mitral valve stenosis. MV peak gradient, 4.8 mmHg. The mean mitral valve gradient is 2.0 mmHg. Tricuspid Valve: The tricuspid valve is not well visualized. Tricuspid valve regurgitation is trivial. Aortic Valve: The aortic valve was not well visualized. Aortic valve regurgitation is mild to moderate. Aortic regurgitation PHT measures 470 msec. No aortic stenosis is present. Aortic valve mean gradient measures  7.0 mmHg. Aortic valve peak gradient measures 11.3 mmHg. Aortic valve area, by VTI measures 1.40 cm. Pulmonic Valve: The pulmonic valve was not well visualized. Pulmonic valve regurgitation not well assessed. No evidence of pulmonic stenosis. Aorta: The aortic root is normal in size and structure. Pulmonary Artery: The pulmonary artery is not well seen. Venous: The inferior vena cava is normal in size with greater than 50% respiratory variability, suggesting right atrial pressure of 3 mmHg. IAS/Shunts: The interatrial septum was not well visualized.  LEFT VENTRICLE PLAX 2D LVIDd:         4.89 cm      Diastology LVIDs:         4.16 cm      LV e' lateral:   5.59 cm/s LV PW:         0.95 cm      LV E/e' lateral: 12.3 LV IVS:        1.32 cm      LV e' medial:    4.05 cm/s LVOT diam:     1.80 cm      LV E/e' medial:  17.0 LV SV:         41 LV SV Index:   20 LVOT Area:     2.54 cm  LV Volumes (MOD) LV vol d, MOD A2C: 134.0 ml LV vol d, MOD A4C: 56.5 ml LV vol s, MOD A2C: 78.5 ml LV vol s, MOD A4C: 48.3 ml LV SV MOD A2C:     55.5 ml LV SV MOD A4C:     56.5 ml LV SV MOD BP:      43.0 ml RIGHT  VENTRICLE RV S prime:     9.16 cm/s TAPSE (M-mode): 1.9 cm LEFT ATRIUM             Index LA Vol (A2C):   45.4 ml 21.93 ml/m LA Vol (A4C):   48.1 ml 23.23 ml/m LA Biplane Vol: 47.3 ml 22.85 ml/m  AORTIC VALVE AV Area (Vmax):    1.54 cm AV Area (Vmean):   1.47 cm AV Area (VTI):     1.40 cm AV Vmax:           168.00 cm/s AV Vmean:          120.500 cm/s AV VTI:            0.294 m AV Peak Grad:      11.3 mmHg AV Mean Grad:      7.0 mmHg LVOT Vmax:         102.00 cm/s LVOT Vmean:        69.800 cm/s LVOT VTI:          0.162 m LVOT/AV VTI ratio: 0.55 AI PHT:            470 msec MITRAL VALVE MV Peak grad: 4.8 mmHg     SHUNTS MV Mean grad: 2.0 mmHg     Systemic VTI:  0.16 m MV Vmax:      1.10 m/s     Systemic Diam: 1.80 cm MV Vmean:     69.6 cm/s MV E velocity: 68.90 cm/s MV A velocity: 97.50 cm/s MV E/A ratio:  0.71 Christopher End MD Electronically signed by Nelva Bush MD Signature Date/Time: 02/04/2020/2:31:26 PM    Final      Nutrition Status: Nutrition Problem: Inadequate oral intake Etiology: inability to eat(pt sedated and ventilated) Signs/Symptoms: NPO status    BMP Latest Ref Rng & Units 02/04/2020 02/04/2020 02/20/2020  Glucose 70 - 99 mg/dL 124(H) 201(H) 366(H)  BUN 8 - 23 mg/dL _0 Creatinine 0.61 - 1.24 mg/dL 1.27(H) 1.26(H) 1.50(H)  BUN/Creat Ratio 10 - 24 - - -  Sodium 135 - 145 mmol/L 133(L) 130(L) 125(L)  Potassium 3.5 - 5.1 mmol/L 4.3 4.8 5.5(H)  Chloride 98 - 111 mmol/L 101 95(L) 90(L)  CO2 22 - 32 mmol/L 24 26 19(L)  Calcium 8.9 - 10.3 mg/dL 6.9(L) 6.7(L) 8.2(L)      Indwelling Urinary Catheter continued, requirement due to   Reason to continue Indwelling Urinary Catheter strict Intake/Output monitoring for hemodynamic instability   Central Line/ continued, requirement due to  Reason to continue [place]anna of central venous pressure or other hemodynamic parameters and poor IV access   Ventilator continued, requirement due to severe respiratory failure    Ventilator Sedation RASS 0 to -2      ASSESSMENT AND PLAN SYNOPSIS   Severe ACUTE Hypoxic and Hypercapnic Respiratory Failure -continue Full MV support -continue Bronchodilator Therapy -Wean Fio2 and PEEP as tolerated -will perform SAT/SBT when respiratory parameters are met -VAP/VENT bundle implementation  ACUTE SYSTOLIC CARDIAC FAILURE- EF 30% -oxygen as needed -Lasix as tolerated -follow up cardiac enzymes as indicated -follow up cardiology recs +NSTEMI On heparin infusion   ACUTE KIDNEY INJURY/Renal Failure -follow chem 7 -follow UO -continue Foley Catheter-assess need -Avoid nephrotoxic agents -Recheck creatinine     NEUROLOGY - intubated and sedated - minimal sedation to achieve a RASS goal: -1 Wake up assessment pending   CARDIAC ICU monitoring  ID -continue IV abx as prescibed -follow up cultures Cover aspiration pneumonia  GI GI PROPHYLAXIS as indicated   DIET-->TF's as tolerated Constipation protocol as indicated  ENDO - will use ICU hypoglycemic\Hyperglycemia protocol if indicated   ELECTROLYTES -follow labs as needed -replace as needed -pharmacy consultation and following   DVT/GI PRX ordered TRANSFUSIONS AS NEEDED MONITOR FSBS ASSESS the need for LABS as needed   Critical Care Time devoted to patient care services described in this note is 32 minutes.   Overall, patient is critically ill, prognosis is guarded.  Patient with Multiorgan failure and at high risk for cardiac arrest and death.   Will obtain Palliative care consultation  Corrin Parker, M.D.  [facility]  Medical Director [place] Director [facility] Cardio-Pulmonary Department

## 2020-02-05 NOTE — Plan of Care (Signed)
Patient is on vent and unresponsive. Spoke with next of kin [place]. Plans to meet tomorrow at 10:15 at bedside for GOC. Marland Kitchen

## 2020-02-05 NOTE — Progress Notes (Signed)
[facility] Cardiology    SUBJECTIVE: Patient currently intubated and sedated.    Vitals:   02/05/20 0815 02/05/20 0900 02/05/20 1000 02/05/20 1100  BP:  (!) 107/56 103/60 (!) 106/57  Pulse:  (!) 103 (!) 101 93  Resp:  (!) 22 (!) 22 (!) 22  Temp:  99 F (37.2 C) 99 F (37.2 C) 98.8 F (37.1 C)  TempSrc:      SpO2: 95% 95% 95% 96%  Weight:      Height:         Intake/Output Summary (Last 24 hours) at 02/05/2020 1136 Last data filed at 02/05/2020 1100 Gross per 24 hour  Intake 1846.51 ml  Output 1775 ml  Net 71.51 ml      PHYSICAL EXAM  General: Critically ill appearing lying in bed sedated and on ventilator HEENT:  Normocephalic and atramatic Neck:  No JVD.  Lungs: vent suppor Heart: regular rhythm, tachycardic. Normal S1 and S2 without gallops or murmurs.  Abdomen: nondistended Extremities: No clubbing, cyanosis or edema.   Neuro: sedated    LABS: Basic Metabolic Panel: Recent Labs    02/04/20 0037 02/04/20 0037 02/04/20 0601 02/05/20 0738 02/05/20 0837  NA 130*   < > 133* 131*  --   K 4.8   < > 4.3 4.2  --   CL 95*   < > 101 96*  --   CO2 26   < > 24 24  --   GLUCOSE 201*   < > 124* 156*  --   BUN 11   < > 14 17  --   CREATININE 1.26*   < > 1.27* 1.19  --   CALCIUM 6.7*   < > 6.9* 7.1*  --   MG 1.8   < > 1.9  --  2.3  PHOS 5.7*  --   --   --  4.2   < > = values in this interval not displayed.   Liver Function Tests: Recent Labs    02/13/20 1919  AST 93*  ALT 64*  ALKPHOS 69  BILITOT 0.7  PROT 5.8*  ALBUMIN 3.3*   No results for input(s): LIPASE, AMYLASE in the last 72 hours. CBC: Recent Labs    02/13/2020 1919 02/13/2020 1919 02/04/20 0037 02/04/20 0801 02/05/20 0130 02/05/20 0738  WBC 8.1  --  13.6*  --   --   --   NEUTROABS 2.2  --   --   --   --   --   HGB 13.8   < > 14.8   < > 13.5 13.5  HCT 41.4   < > 42.9   < > 39.1 39.1  MCV 99.3  --  96.4  --   --   --   PLT 196  --  266  --   --   --    < > = values in this interval not displayed.    Cardiac Enzymes: No results for input(s): CKTOTAL, CKMB, CKMBINDEX, TROPONINI in the last 72 hours. BNP: Invalid input(s): POCBNP D-Dimer: No results for input(s): DDIMER in the last 72 hours. Hemoglobin A1C: Recent Labs    02-13-20 2100  HGBA1C 5.7*   Fasting Lipid Panel: Recent Labs    02/05/20 0306  TRIG 253*   Thyroid Function Tests: No results for input(s): TSH, T4TOTAL, T3FREE, THYROIDAB in the last 72 hours.  Invalid input(s): FREET3 Anemia Panel: No results for input(s): VITAMINB12, FOLATE, FERRITIN, TIBC, IRON, RETICCTPCT in the last 72 hours.  DG Abdomen 1 View  Result Date: 03-04-20 CLINICAL DATA:  OG tube placement EXAM: ABDOMEN - 1 VIEW COMPARISON:  None. FINDINGS: OG tube tip is in the stomach. Nonobstructive bowel gas pattern. Visualized lung bases clear. IMPRESSION: OG tube tip in the stomach. Electronically Signed   By: Charlett Nose M.D.   On: Mar 04, 2020 19:47   CT Head Wo Contrast  Result Date: 2020-03-04 CLINICAL DATA:  64 year old male with cardiac arrest and encephalopathy. EXAM: CT HEAD WITHOUT CONTRAST CT CERVICAL SPINE WITHOUT CONTRAST TECHNIQUE: Multidetector CT imaging of the head and cervical spine was performed following the standard protocol without intravenous contrast. Multiplanar CT image reconstructions of the cervical spine were also generated. COMPARISON:  None. FINDINGS: Evaluation of this exam is limited due to motion artifact. CT HEAD FINDINGS Brain: There is mild age-related atrophy and chronic microvascular ischemic changes. Probable small old left caudate head infarct as well as focal area of old infarct involving the right cerebellum. There is no acute intracranial hemorrhage. No mass effect or midline shift. No extra-axial fluid collection. Vascular: No hyperdense vessel or unexpected calcification. Skull: Normal. Negative for fracture or focal lesion. Sinuses/Orbits: There is diffuse mucoperiosteal thickening of paranasal sinuses. The  mastoid air cells are clear. Other: None CT CERVICAL SPINE FINDINGS Alignment: No acute subluxation. There is mild reversal of normal cervical lordosis which may be positional or due to muscle spasm or secondary to degenerative changes. Skull base and vertebrae: No definite acute fracture. Soft tissues and spinal canal: No prevertebral fluid or swelling. No visible canal hematoma. Disc levels: Multilevel degenerative changes with endplate irregularity and disc space narrowing. Upper chest: Left apical subpleural density, likely atelectasis. Other: Partially visualized endotracheal and enteric tube as well as right IJ central venous line. Bilateral carotid bulb calcified plaques. IMPRESSION: 1. No acute intracranial pathology. 2. Age-related atrophy and chronic microvascular ischemic changes. Old right cerebellar infarct. 3. No acute/traumatic cervical spine pathology. Electronically Signed   By: Elgie Collard M.D.   On: 03-04-20 23:53   CT Cervical Spine Wo Contrast  Result Date: 03/04/2020 CLINICAL DATA:  64 year old male with cardiac arrest and encephalopathy. EXAM: CT HEAD WITHOUT CONTRAST CT CERVICAL SPINE WITHOUT CONTRAST TECHNIQUE: Multidetector CT imaging of the head and cervical spine was performed following the standard protocol without intravenous contrast. Multiplanar CT image reconstructions of the cervical spine were also generated. COMPARISON:  None. FINDINGS: Evaluation of this exam is limited due to motion artifact. CT HEAD FINDINGS Brain: There is mild age-related atrophy and chronic microvascular ischemic changes. Probable small old left caudate head infarct as well as focal area of old infarct involving the right cerebellum. There is no acute intracranial hemorrhage. No mass effect or midline shift. No extra-axial fluid collection. Vascular: No hyperdense vessel or unexpected calcification. Skull: Normal. Negative for fracture or focal lesion. Sinuses/Orbits: There is diffuse  mucoperiosteal thickening of paranasal sinuses. The mastoid air cells are clear. Other: None CT CERVICAL SPINE FINDINGS Alignment: No acute subluxation. There is mild reversal of normal cervical lordosis which may be positional or due to muscle spasm or secondary to degenerative changes. Skull base and vertebrae: No definite acute fracture. Soft tissues and spinal canal: No prevertebral fluid or swelling. No visible canal hematoma. Disc levels: Multilevel degenerative changes with endplate irregularity and disc space narrowing. Upper chest: Left apical subpleural density, likely atelectasis. Other: Partially visualized endotracheal and enteric tube as well as right IJ central venous line. Bilateral carotid bulb calcified plaques. IMPRESSION: 1. No acute intracranial pathology.  2. Age-related atrophy and chronic microvascular ischemic changes. Old right cerebellar infarct. 3. No acute/traumatic cervical spine pathology. Electronically Signed   By: Elgie Collard M.D.   On: 02/10/2020 23:53   DG Chest Port 1 View  Result Date: 02/04/2020 CLINICAL DATA:  Intubation EXAM: PORTABLE CHEST 1 VIEW COMPARISON:  Yesterday FINDINGS: The endotracheal tube tip is halfway between the clavicular heads and carina. The enteric tube reaches the stomach. Right IJ line with tip at the upper SVC. Interstitial coarsening with mildly improved aeration from before. No effusion or apical pneumothorax. More lucent appearance at the bases is symmetric and attributed to angle of imaging. Normal heart size. IMPRESSION: 1. Unremarkable hardware positioning. 2. Mild improvement in lung volumes. Electronically Signed   By: Marnee Spring M.D.   On: 02/04/2020 04:51   DG Chest Portable 1 View  Result Date: Feb 10, 2020 CLINICAL DATA:  Central line placement EXAM: PORTABLE CHEST 1 VIEW COMPARISON:  2020-02-10 FINDINGS: Right central line tip is in the SVC. No pneumothorax. Endotracheal tube and NG tube are unchanged. Heart is normal size.  Increasing bibasilar opacities, likely atelectasis. No effusions. No acute bony abnormality. IMPRESSION: Right central line tip in the SVC.  No pneumothorax. Bibasilar atelectasis. Electronically Signed   By: Charlett Nose M.D.   On: 2020/02/10 21:08   DG Chest Portable 1 View  Result Date: Feb 10, 2020 CLINICAL DATA:  Cardiac arrest EXAM: PORTABLE CHEST 1 VIEW COMPARISON:  05/15/2019 FINDINGS: Endotracheal to is 2.5 cm above the carina. NG tube enters the stomach. Heart is normal size. No confluent opacities or effusions. No pneumothorax. No acute bony abnormality. IMPRESSION: No acute cardiopulmonary disease. Electronically Signed   By: Charlett Nose M.D.   On: 02/10/20 19:46   ECHOCARDIOGRAM COMPLETE  Result Date: 02/04/2020    ECHOCARDIOGRAM REPORT   Patient Name:   Brandon Bolton Mccolm Date of Exam: 02/04/2020 Medical Rec #:  [id number]   Height:       72.0 in Accession #:    [id number]  Weight:       186.9 lb Date of Birth:  October 14, 1956   BSA:          2.070 m Patient Age:    64 years    BP:           96/61 mmHg Patient Gender: M           HR:           82 bpm. Exam Location:  [facility] Procedure: 2D Echo and Intracardiac Opacification Agent Indications:     Cardiac Arrest  History:         Patient has prior history of Echocardiogram examinations. CHF,                  COPD; Risk Factors:Hypertension, Diabetes and Dyslipidemia.  Sonographer:     Enrique Sack Thornton-Maynard Referring Phys:  [id number] Alphonzo Lemmings F DAVIS Diagnosing Phys: Yvonne Kendall MD  Sonographer Comments: Image acquisition challenging due to COPD. IMPRESSIONS  1. Left ventricular ejection fraction, by estimation, is 35 to 40%. The left ventricle has moderately decreased function. Left ventricular endocardial border not optimally defined to evaluate regional wall motion. There is mild asymmetric left ventricular hypertrophy of the basal-septal segment. Left ventricular diastolic parameters are consistent with Grade I diastolic dysfunction (impaired relaxation).  Elevated left atrial pressure.  2. Right ventricular systolic function is mildly reduced. The right ventricular size is normal. Tricuspid regurgitation signal is inadequate for assessing PA pressure.  3. The mitral valve  was not well visualized. Mild mitral valve regurgitation. No evidence of mitral stenosis.  4. The aortic valve was not well visualized. Aortic valve regurgitation is mild to moderate. No aortic stenosis is present.  5. Pulmonic valve regurgitation not well assessed.  6. The inferior vena cava is normal in size with greater than 50% respiratory variability, suggesting right atrial pressure of 3 mmHg. FINDINGS  Left Ventricle: Left ventricular ejection fraction, by estimation, is 35 to 40%. The left ventricle has moderately decreased function. Left ventricular endocardial border not optimally defined to evaluate regional wall motion. Definity contrast agent was given IV to delineate the left ventricular endocardial borders. The left ventricular internal cavity size was normal in size. There is mild asymmetric left ventricular hypertrophy of the basal-septal segment. Abnormal (paradoxical) septal motion, consistent with left bundle branch block. Left ventricular diastolic parameters are consistent with Grade I diastolic dysfunction (impaired relaxation). Elevated left atrial pressure.  LV Wall Scoring: The mid and distal anterior wall, mid inferoseptal segment, and apical septal segment are akinetic. The entire lateral wall, anterior septum, entire inferior wall, basal anterior segment, and basal inferoseptal segment are hypokinetic. Right Ventricle: The right ventricular size is normal. No increase in right ventricular wall thickness. Right ventricular systolic function is mildly reduced. Tricuspid regurgitation signal is inadequate for assessing PA pressure. Left Atrium: Left atrial size was normal in size. Right Atrium: Right atrial size was not well visualized. Pericardium: There is no evidence of  pericardial effusion. Mitral Valve: The mitral valve was not well visualized. Mild mitral annular calcification. Mild mitral valve regurgitation. No evidence of mitral valve stenosis. MV peak gradient, 4.8 mmHg. The mean mitral valve gradient is 2.0 mmHg. Tricuspid Valve: The tricuspid valve is not well visualized. Tricuspid valve regurgitation is trivial. Aortic Valve: The aortic valve was not well visualized. Aortic valve regurgitation is mild to moderate. Aortic regurgitation PHT measures 470 msec. No aortic stenosis is present. Aortic valve mean gradient measures 7.0 mmHg. Aortic valve peak gradient measures 11.3 mmHg. Aortic valve area, by VTI measures 1.40 cm. Pulmonic Valve: The pulmonic valve was not well visualized. Pulmonic valve regurgitation not well assessed. No evidence of pulmonic stenosis. Aorta: The aortic root is normal in size and structure. Pulmonary Artery: The pulmonary artery is not well seen. Venous: The inferior vena cava is normal in size with greater than 50% respiratory variability, suggesting right atrial pressure of 3 mmHg. IAS/Shunts: The interatrial septum was not well visualized.  LEFT VENTRICLE PLAX 2D LVIDd:         4.89 cm      Diastology LVIDs:         4.16 cm      LV e' lateral:   5.59 cm/s LV PW:         0.95 cm      LV E/e' lateral: 12.3 LV IVS:        1.32 cm      LV e' medial:    4.05 cm/s LVOT diam:     1.80 cm      LV E/e' medial:  17.0 LV SV:         41 LV SV Index:   20 LVOT Area:     2.54 cm  LV Volumes (MOD) LV vol d, MOD A2C: 134.0 ml LV vol d, MOD A4C: 56.5 ml LV vol s, MOD A2C: 78.5 ml LV vol s, MOD A4C: 48.3 ml LV SV MOD A2C:     55.5 ml LV SV MOD  A4C:     56.5 ml LV SV MOD BP:      43.0 ml RIGHT VENTRICLE RV S prime:     9.16 cm/s TAPSE (M-mode): 1.9 cm LEFT ATRIUM             Index LA Vol (A2C):   45.4 ml 21.93 ml/m LA Vol (A4C):   48.1 ml 23.23 ml/m LA Biplane Vol: 47.3 ml 22.85 ml/m  AORTIC VALVE AV Area (Vmax):    1.54 cm AV Area (Vmean):   1.47 cm AV  Area (VTI):     1.40 cm AV Vmax:           168.00 cm/s AV Vmean:          120.500 cm/s AV VTI:            0.294 m AV Peak Grad:      11.3 mmHg AV Mean Grad:      7.0 mmHg LVOT Vmax:         102.00 cm/s LVOT Vmean:        69.800 cm/s LVOT VTI:          0.162 m LVOT/AV VTI ratio: 0.55 AI PHT:            470 msec MITRAL VALVE MV Peak grad: 4.8 mmHg     SHUNTS MV Mean grad: 2.0 mmHg     Systemic VTI:  0.16 m MV Vmax:      1.10 m/s     Systemic Diam: 1.80 cm MV Vmean:     69.6 cm/s MV E velocity: 68.90 cm/s MV A velocity: 97.50 cm/s MV E/A ratio:  0.71 Cristal Deerhristopher End MD Electronically signed by Yvonne Kendallhristopher End MD Signature Date/Time: 02/04/2020/2:31:26 PM    Final      Echo LVEF 35-40%, mild to moderate AI, mild MR  TELEMETRY: sinus tachycardia  ASSESSMENT AND PLAN:  Active Problems:   Cardiac arrest (HCC)    1. Acute respiratory failure secondary to NSTEMI and acute COPD exacerbation, status post cardiac arrest with unknown downtime. ECG revealed sinus rhythm with known LBBB. High sensitivity troponin peaked at >25,000, and has down trended to 5567. Patient currently on heparin drip.  2. Alcoholic cardiomyopathy, diagnosed 05/2019, EF 30-35%, ETOH on admission 47 3. Chronic systolic CHF, chest xray negative for pulmonary edema  Recommendations: 1. Agree with current therapy 2. Continue heparin drip for 72 hours, monitoring CBC 3. Defer further cardiac diagnostics at this time. As patient's neurological status is determined, will consider cardiac catheterization.   Leanora Ivanoffnna Orlin Kann, PA-C 02/05/2020 11:36 AM   Plan made in collaboration with Dr. Darrold JunkerParaschos.

## 2020-02-05 NOTE — Progress Notes (Signed)
ANTICOAGULATION CONSULT NOTE  Pharmacy Consult for heparin Indication: chest pain/ACS/elevated troponin  No Known Allergies  Patient Measurements: Height: 6' (182.9 cm) Weight: 79.6 kg (175 lb 7.8 oz) IBW/kg (Calculated) : 77.6 Heparin Dosing Weight: 77.6 kg  Vital Signs: Temp: 99.3 F (37.4 C) (04/07 0200) Temp Source: Bladder (04/07 0000) BP: 111/63 (04/07 0200) Pulse Rate: 110 (04/07 0200)  Labs: Recent Labs    02/11/2020 1919 02/13/2020 1919 02/27/2020 2101 02/04/20 0037 02/04/20 0037 02/04/20 0601 02/04/20 0801 02/04/20 0801 02/04/20 0841 02/04/20 1251 02/04/20 2125 02/05/20 0130 02/05/20 0306  HGB 13.8   < >  --  14.8   < >  --  14.6   < >  --  14.2  --  13.5  --   HCT 41.4   < >  --  42.9   < >  --  44.0  --   --  40.7  --  39.1  --   PLT 196  --   --  266  --   --   --   --   --   --   --   --   --   APTT  --   --   --   --   --   --   --   --  >160*  --   --   --   --   LABPROT  --   --   --   --   --   --   --   --  15.4*  --   --   --   --   INR  --   --   --   --   --   --   --   --  1.2  --   --   --   --   HEPARINUNFRC  --   --   --   --   --   --   --   --   --  0.89* 0.58  --  0.52  CREATININE 1.50*  --   --  1.26*  --  1.27*  --   --   --   --   --   --   --   TROPONINIHS 38*  --  3,472*  --   --  25,859*  --   --   --   --   --   --   --    < > = values in this interval not displayed.    Estimated Creatinine Clearance: 65.3 mL/min (A) (by C-G formula based on SCr of 1.27 mg/dL (H)).   Medical History: Past Medical History:  Diagnosis Date  . CHF (congestive heart failure) (HCC)    EF: 35-40%, global hypokinesis, mod. mitral regurgitation (03/2019)  . COPD (chronic obstructive pulmonary disease) (HCC)   . Diabetes mellitus, type 2 (HCC)   . Drug abuse (HCC)   . Dupuytren's contracture   . ETOH abuse 2018  . Hypertension   . Mixed hyperlipidemia 01/2018    Medications:  Scheduled:  . budesonide (PULMICORT) nebulizer solution  0.5 mg  Nebulization BID  . Chlorhexidine Gluconate Cloth  6 each Topical Daily  . folic acid  1 mg Intravenous Daily  . insulin aspart  0-15 Units Subcutaneous Q4H  . ipratropium-albuterol  3 mL Nebulization Q4H  . methylPREDNISolone (SOLU-MEDROL) injection  20 mg Intravenous BID  . pantoprazole (PROTONIX) IV  40 mg Intravenous BID  . thiamine  100 mg Oral Daily    Assessment: Patient arrived as a CODE BLUE s/p CPR w/ ROSC. Patient has a h/o COPD, CHF, DM, HTN. Patient's troponin on arrival 39 >> 3500 >> 25000. No anticoagulation PTA. Being started on heparin drip for concern of ACS/NSTEMI. Patient had received one dose of heparin 5000 units subq x 1. Baseline coags pending, CBC WNL.  Goal of Therapy:  Heparin level 0.3-0.7 units/ml Monitor platelets by anticoagulation protocol: Yes   Plan:  04/07 @ 0300 HL 0.52 therapeutic. Will continue current rate and will recheck HL w/ am labs, h/h stable will continue to monitor.  Tobie Lords, PharmD, BCPS Clinical Pharmacist 02/05/2020,4:44 AM

## 2020-02-05 NOTE — Progress Notes (Addendum)
Patient remains unresponsive and no longer responds to pain.  He is receiving propofol at and fentanyl at .Marland Kitchen  He is also receiving Heparin @ 9.28ml/hr.  His last Heparin level value recorded @ 0.57.  CHG cleansing completed.  His pupils are non-reactive to light and around 25mm in diameter.  His lung sounds are diminished but he does have some expiratory wheeze on the apical region of his left mid lobe.  He is on 7.5 ETT 25cm @ lips receiving 30% FiO2 Peep 5 Rate 22 Tidal Volume 500.  His bite block is in place.  The ETT tube is in the center of the mouth.  His belly is distended and his last known B/M was 4/6.  He has hypoactive bowel sounds.  O/G tube measures 65 cm at lips and is on intermittent suction at . Dark bile suctioned from belly.  Physician aware. His niece Jeanice Lim came to bedside today to visit and spoke with Dr. Belia Heman about further plan of care and is scheduled to return tomorrow around 10:15 to have a palliative meeting.  Foley cath patent and in place.  UOP WDL.

## 2020-02-06 ENCOUNTER — Inpatient Hospital Stay: Payer: Medicaid Other

## 2020-02-06 DIAGNOSIS — Z7189 Other specified counseling: Secondary | ICD-10-CM

## 2020-02-06 DIAGNOSIS — Z515 Encounter for palliative care: Secondary | ICD-10-CM

## 2020-02-06 LAB — CBC
HCT: 36.4 % — ABNORMAL LOW (ref 39.0–52.0)
Hemoglobin: 12.4 g/dL — ABNORMAL LOW (ref 13.0–17.0)
MCH: 33.7 pg (ref 26.0–34.0)
MCHC: 34.1 g/dL (ref 30.0–36.0)
MCV: 98.9 fL (ref 80.0–100.0)
Platelets: 200 10*3/uL (ref 150–400)
RBC: 3.68 MIL/uL — ABNORMAL LOW (ref 4.22–5.81)
RDW: 14.1 % (ref 11.5–15.5)
WBC: 15.8 10*3/uL — ABNORMAL HIGH (ref 4.0–10.5)
nRBC: 0 % (ref 0.0–0.2)

## 2020-02-06 LAB — PHOSPHORUS: Phosphorus: 3.2 mg/dL (ref 2.5–4.6)

## 2020-02-06 LAB — GLUCOSE, CAPILLARY
Glucose-Capillary: 145 mg/dL — ABNORMAL HIGH (ref 70–99)
Glucose-Capillary: 135 mg/dL — ABNORMAL HIGH (ref 70–99)
Glucose-Capillary: 106 mg/dL — ABNORMAL HIGH (ref 70–99)
Glucose-Capillary: 141 mg/dL — ABNORMAL HIGH (ref 70–99)

## 2020-02-06 LAB — MAGNESIUM: Magnesium: 2.5 mg/dL — ABNORMAL HIGH (ref 1.7–2.4)

## 2020-02-06 LAB — TRIGLYCERIDES
Triglycerides: 371 mg/dL — ABNORMAL HIGH (ref <150)
Triglycerides: 350 mg/dL — ABNORMAL HIGH (ref <150)

## 2020-02-06 LAB — HEPARIN LEVEL (UNFRACTIONATED): Heparin Unfractionated: 0.32 IU/mL (ref 0.30–0.70)

## 2020-02-06 MED ORDER — POLYVINYL ALCOHOL 1.4 % OP SOLN
1.0000 [drp] | Freq: Four times a day (QID) | OPHTHALMIC | Status: DC | PRN
  Filled 2020-02-06: qty 15

## 2020-02-06 MED ORDER — GLYCOPYRROLATE 1 MG PO TABS
1.0000 mg | ORAL_TABLET | ORAL | Status: DC | PRN
  Filled 2020-02-06: qty 1

## 2020-02-06 MED ORDER — ACETAMINOPHEN 325 MG PO TABS: 650.0000 mg | ORAL_TABLET | Freq: Four times a day (QID) | ORAL | Status: DC | PRN

## 2020-02-06 MED ORDER — THIAMINE HCL 100 MG PO TABS: 100.0000 mg | ORAL_TABLET | Freq: Every day | ORAL | Status: DC

## 2020-02-06 MED ORDER — DEXTROSE 5 % IV SOLN: INTRAVENOUS | Status: DC

## 2020-02-06 MED ORDER — MIDAZOLAM HCL 2 MG/2ML IJ SOLN: 2.0000 mg | INTRAMUSCULAR | Status: DC | PRN

## 2020-02-06 MED ORDER — CHLORHEXIDINE GLUCONATE 0.12% ORAL RINSE (MEDLINE KIT)
15.0000 mL | Freq: Two times a day (BID) | OROMUCOSAL | Status: DC
  Administered 2020-02-06 – 2020-02-07 (×3): 15 mL via OROMUCOSAL

## 2020-02-06 MED ORDER — MORPHINE 100MG IN NS 100ML (1MG/ML) PREMIX INFUSION
0.0000 mg/h | INTRAVENOUS | Status: DC
  Administered 2020-02-07: 5 mg/h via INTRAVENOUS
  Administered 2020-02-07: 15 mg/h via INTRAVENOUS
  Filled 2020-02-06 (×3): qty 100

## 2020-02-06 MED ORDER — ACETAMINOPHEN 650 MG RE SUPP: 650.0000 mg | Freq: Four times a day (QID) | RECTAL | Status: DC | PRN

## 2020-02-06 MED ORDER — DEXMEDETOMIDINE HCL IN NACL 400 MCG/100ML IV SOLN: 0.4000 ug/kg/h | INTRAVENOUS | Status: DC

## 2020-02-06 MED ORDER — ORAL CARE MOUTH RINSE
15.0000 mL | OROMUCOSAL | Status: DC
  Administered 2020-02-06 – 2020-02-07 (×11): 15 mL via OROMUCOSAL

## 2020-02-06 MED ORDER — DIPHENHYDRAMINE HCL 50 MG/ML IJ SOLN: 25.0000 mg | INTRAMUSCULAR | Status: DC | PRN

## 2020-02-06 MED ORDER — PRO-STAT SUGAR FREE PO LIQD
30.0000 mL | Freq: Two times a day (BID) | ORAL | Status: DC
  Administered 2020-02-06: 30 mL

## 2020-02-06 MED ORDER — MORPHINE BOLUS VIA INFUSION
5.0000 mg | INTRAVENOUS | Status: DC | PRN
  Administered 2020-02-07 (×3): 5 mg via INTRAVENOUS
  Filled 2020-02-06: qty 5

## 2020-02-06 MED ORDER — VITAL 1.5 CAL PO LIQD: 1000.0000 mL | ORAL | Status: DC

## 2020-02-06 MED ORDER — PROPOFOL 1000 MG/100ML IV EMUL
5.0000 ug/kg/min | INTRAVENOUS | Status: DC
  Administered 2020-02-06: 30 ug/kg/min via INTRAVENOUS
  Administered 2020-02-06 – 2020-02-07 (×2): 20 ug/kg/min via INTRAVENOUS
  Administered 2020-02-07: 20.678 ug/kg/min via INTRAVENOUS
  Administered 2020-02-07: 30 ug/kg/min via INTRAVENOUS
  Filled 2020-02-06 (×4): qty 100

## 2020-02-06 MED ORDER — ENOXAPARIN SODIUM 40 MG/0.4ML ~~LOC~~ SOLN
40.0000 mg | Freq: Every day | SUBCUTANEOUS | Status: DC
  Administered 2020-02-06: 40 mg via SUBCUTANEOUS
  Filled 2020-02-06: qty 0.4

## 2020-02-06 MED ORDER — VECURONIUM BROMIDE 10 MG IV SOLR
10.0000 mg | Freq: Once | INTRAVENOUS | Status: AC
  Administered 2020-02-06: 10 mg via INTRAVENOUS
  Filled 2020-02-06: qty 10

## 2020-02-06 MED ORDER — VECURONIUM BROMIDE 10 MG IV SOLR
10.0000 mg | Freq: Once | INTRAVENOUS | Status: AC
  Administered 2020-02-06: 10 mg via INTRAVENOUS

## 2020-02-06 MED ORDER — GLYCOPYRROLATE 0.2 MG/ML IJ SOLN: 0.2000 mg | INTRAMUSCULAR | Status: DC | PRN

## 2020-02-06 MED ORDER — VITAL HIGH PROTEIN PO LIQD: 1000.0000 mL | ORAL | Status: DC

## 2020-02-06 MED ORDER — MORPHINE SULFATE (PF) 2 MG/ML IV SOLN: 2.0000 mg | INTRAVENOUS | Status: DC | PRN

## 2020-02-06 NOTE — Progress Notes (Signed)
ANTICOAGULATION CONSULT NOTE  Pharmacy Consult for heparin Indication: chest pain/ACS/elevated troponin  No Known Allergies  Patient Measurements: Height: 6' (182.9 cm) Weight: 80.6 kg (177 lb 11.1 oz) IBW/kg (Calculated) : 77.6 Heparin Dosing Weight: 77.6 kg  Vital Signs: Temp: 98.2 F (36.8 C) (04/08 0600) BP: 134/71 (04/08 0600) Pulse Rate: 99 (04/08 0600)  Labs: Recent Labs    02/18/2020 1919 02/22/2020 1919 01/30/2020 2101 02/04/20 0037 02/04/20 0601 02/04/20 0801 02/04/20 0841 02/04/20 1251 02/04/20 2125 02/05/20 0130 02/05/20 0130 02/05/20 0306 02/05/20 0738 02/06/20 0520  HGB 13.8   < >  --  14.8  --    < >  --    < >  --  13.5   < >  --  13.5 12.4*  HCT 41.4   < >  --  42.9  --    < >  --    < >  --  39.1  --   --  39.1 36.4*  PLT 196  --   --  266  --   --   --   --   --   --   --   --   --  200  APTT  --   --   --   --   --   --  >160*  --   --   --   --   --   --   --   LABPROT  --   --   --   --   --   --  15.4*  --   --   --   --   --   --   --   INR  --   --   --   --   --   --  1.2  --   --   --   --   --   --   --   HEPARINUNFRC  --   --   --   --   --   --   --    < > 0.58  --   --  0.52  --  0.32  CREATININE 1.50*   < >  --  1.26* 1.27*  --   --   --   --   --   --   --  1.19  --   TROPONINIHS 38*   < > 3,472*  --  32,440*  --   --   --   --   --   --   --  5,567*  --    < > = values in this interval not displayed.    Estimated Creatinine Clearance: 69.7 mL/min (by C-G formula based on SCr of 1.19 mg/dL).   Medical History: Past Medical History:  Diagnosis Date  . CHF (congestive heart failure) (HCC)    EF: 35-40%, global hypokinesis, mod. mitral regurgitation (03/2019)  . COPD (chronic obstructive pulmonary disease) ([place])   . Diabetes mellitus, type 2 ([place])   . Drug abuse ([place])   . Dupuytren's contracture   . ETOH abuse 2018  . Hypertension   . Mixed hyperlipidemia 01/2018    Medications:  Scheduled:  . budesonide (PULMICORT) nebulizer  solution  0.5 mg Nebulization BID  . chlorhexidine gluconate (MEDLINE KIT)  15 mL Mouth Rinse BID  . Chlorhexidine Gluconate Cloth  6 each Topical Daily  . folic acid  1 mg Intravenous Daily  . insulin aspart  0-15 Units Subcutaneous  Q4H  . ipratropium-albuterol  3 mL Nebulization Q4H  . mouth rinse  15 mL Mouth Rinse 10 times per day  . methylPREDNISolone (SOLU-MEDROL) injection  20 mg Intravenous BID  . pantoprazole (PROTONIX) IV  40 mg Intravenous BID  . PHENObarbital  65 mg Intravenous Q6H   Followed by  . [START ON 02-16-2020] PHENObarbital  65 mg Intravenous TID   Followed by  . [START ON 02/08/2020] PHENObarbital  65 mg Intravenous BID   Followed by  . [START ON 02/09/2020] PHENObarbital  32.5 mg Intravenous BID   Followed by  . [START ON 02/10/2020] PHENObarbital  32.5 mg Intravenous Once  . thiamine  100 mg Oral Daily    Assessment: Patient arrived as a CODE BLUE s/p CPR w/ ROSC. Patient has a h/o COPD, CHF, DM, HTN. Patient's troponin on arrival 39 >> 3500 >> 25000. No anticoagulation PTA. Being started on heparin drip for concern of ACS/NSTEMI. Patient had received one dose of heparin 5000 units subq x 1. Baseline coags pending, CBC WNL.  Goal of Therapy:  Heparin level 0.3-0.7 units/ml Monitor platelets by anticoagulation protocol: Yes   Plan:  04/08 @ 0500 HL 0.32 therapeutic, but has been trending down. Will increase rate slightly to 1000 units/hr and will recheck HL at 1300, h/h trending down will continue to monitor.  Tobie Lords, PharmD, BCPS Clinical Pharmacist 02/06/2020,6:50 AM

## 2020-02-06 NOTE — Progress Notes (Signed)
Family At bedside, clinical status relayed to family    Updated and notified of patients medical condition-MRI RESULTS NOTED AND REVIEWED WITH FAMILY  Son Thayer Ohm called to speak with PALLIATIVE CARE NURSE.Marland Kitchen He states he does not want to participate in decision making for his father and requests other family members make decisions.      Progressive multiorgan failure with very low chance of meaningful recovery.  Patient is in dying  Process.  Family understands the situation.  They have consented and agreed to DNR/DNI and would like to proceed with Comfort care measures.   Family are satisfied with Plan of action and management. All questions answered  Lucie Leather, M.D.  [facility]  Medical Director [facility] Medical Director [facility] Cardio-Pulmonary Department

## 2020-02-06 NOTE — Progress Notes (Signed)
Patient remains unresponsive.  Night shift RN noted in report she turned off sedation for awakening assessment with no response, no gag reflex, negative of stimulus from pain or sternal rub.  Patient Vitals WDL. Patient now has +1 Edema developing to the left arm.  Patient presenting with agonal reflex.  Physician ordered paralytic to assist patient with medical therapy management.  ETT 26cm at lips and O/G tube 65cm @ lips.  Palliative consult to take place with patient niece Jeanice Lim today around 10:15.

## 2020-02-06 NOTE — Progress Notes (Signed)
MRI of head scheduled for 1330

## 2020-02-06 NOTE — Progress Notes (Signed)
Nutrition Follow-up  DOCUMENTATION CODES:   Not applicable  INTERVENTION:  Initiate Vital 1.5 Cal at 20 mL/hr and advance by 15 mL/hr every 8 hours to goal rate of 50 mL/hr (1200 mL goal daily volume). Also provide Pro-Stat 30 mL BID per tube. Provides 2000 kcal, 111 grams of protein, 912 mL H2O daily.  Provide minimum free water flush of 20-30 mL Q4hrs to maintain tube patency.  Monitor magnesium, potassium, and phosphorus daily for at least 3 days, MD to replete as needed, as pt is at risk for refeeding syndrome.  NUTRITION DIAGNOSIS:   Inadequate oral intake related to inability to eat(pt sedated and ventilated) as evidenced by NPO status.  Ongoing - addressing with initiation of TF regimen today.  GOAL:   Patient will meet greater than or equal to 90% of their needs  Met with TF regimen.  MONITOR:   PO intake, Supplement acceptance, Labs, Weight trends, I & O's, Skin  REASON FOR ASSESSMENT:   Ventilator    ASSESSMENT:   64yo male with PMX significant for HTN, ETOH abuse, polysubstance abuse, type 2 diabetes, COPD, and CHF who is admitted after cardiac arrest  Patient is currently intubated on ventilator support MV: 10.7 L/min Temp (24hrs), Avg:98 F (36.7 C), Min:97.2 F (36.2 C), Max:100 F (37.8 C)  Propofol: N/A  Medications reviewed and include: folic acid 1 mg daily IV, Novolog 0-15 units Q4hrs, Solu-Medrol 20 mg BID IV, Protonix, thiamine 500 mg daily IV, Unasyn, Precedex gtt, fentanyl gtt.  Labs reviewed: CBG 106-145, Magnesium 2.5, Triglycerides 371.  Enteral Access: 16 Fr. OGT  Discussed on rounds. Palliative medicine following with family to discuss goals of care. Discussed with MD in afternoon via secure chat and plan is to initiate tube feeds today. Patient now off propofol gtt.  Diet Order:   Diet Order            Diet NPO time specified  Diet effective now             EDUCATION NEEDS:   No education needs have been identified at this  time  Skin:  Skin Assessment: Reviewed RN Assessment  Last BM:  02/04/2020 per chart  Height:   Ht Readings from Last 1 Encounters:  01/31/2020 6' (1.829 m)   Weight:   Wt Readings from Last 1 Encounters:  02/05/20 80.6 kg   Ideal Body Weight:  80.9 kg  BMI:  Body mass index is 24.1 kg/m.  Estimated Nutritional Needs:   Kcal:  2000 (PSU 2003b w/ MSJ 1633, Ve 10.7, Tmax 37.8)  Protein:  110-130g/day  Fluid:  >2.4L/day  Jacklynn Barnacle, MS, RD, LDN Pager number available on Amion

## 2020-02-06 NOTE — Consult Note (Addendum)
Consultation Note Date: 02/06/2020   Patient Name: Brandon Bolton  DOB: 27-Oct-1956  MRN: [id number]  Age / Sex: 64 y.o., male  PCP: [facility], [place], NP Referring Physician: Flora Lipps, MD  Reason for Consultation: Establishing goals of care  HPI/Patient Profile: Acute respiratory failure due to NSTEMI and acute COPD exacerbation s/p cardiac arrest with multiorgan failure with acute systolic CHF exacerbation.  Clinical Assessment and Goals of Care: Patient is resting in bed on ventilator. Niece [name] and patient's nephew are at bedside. They state patient has a son who he does not have a relationship with. [name] was able to obtain a phone number for him from another family member. [name] states other staff members have tried to reach the son. I attempted to reach son Brandon Bolton at phone number they provided [phone] and VM was left.   They state he used to love to hunt and fish, and be outdoors. With his COPD, he was no longer able to do that, and now spends his time watching sports on t.v. They state he smokes cigarettes and drinks alcohol. They state he enjoys his privacy. Functionally, he walks to the store when he does not have a ride.   They state they have been kept well updated by the CCM team and voice a good understanding of his condition and concern for poor prognosis. We further discussed diagnosis, prognosis, GOC, EOL wishes disposition and options.  A detailed discussion was had today regarding advanced directives.  Concepts specific to code status, artifical feeding and hydration, IV antibiotics and rehospitalization were discussed.  The difference between an aggressive medical intervention path and a comfort care path was discussed.  Values and goals of care important to patient and family were attempted to be elicited.  Discussed limitations of medical interventions to prolong  quality of life in some situations and discussed the concept of human mortality.  They state he would not be happy having to depend on others, nor would he be happy having to go to a facility even just for rehab as he would not be able to smoke or drink alcohol. They are concerned for his QOL in the best case scenario given his baseline status. They are considering shifting to comfort based care with 1 way extubation. They would like to speak among themselves.    Niece requests palliative to be available to be called if they have questions but is working with CCM.    SUMMARY OF RECOMMENDATIONS   Family has been speaking with staff and CCM. Plans for family to discuss care moving forward. Family is considering shifting to comfort with 1 way extubation. Niece was able to obtain phone number for patient's son today. VM left for patient's son as he would be NOK and a decision maker if calls back and he chose to be.    Niece requests palliative to be available to be called if they have questions.   ADDENDUM: Son Brandon Bolton called to speak with me. He states he does  not want to participate in decision making for his father and requests other family members make decisions.   Spoke with [name] regarding MRI results. [name] witnessed conversation. Plans for [name] and other family members to come to bedside to visit and then shift to comfort care today. [name] confirms she would not want chest compressions if his heart stops.     Prognosis:   Poor      Primary Diagnoses: Present on Admission: . Cardiac arrest ([place])   I have reviewed the medical record, interviewed the patient and family, and examined the patient. The following aspects are pertinent.  Past Medical History:  Diagnosis Date  . CHF (congestive heart failure) (HCC)    EF: 35-40%, global hypokinesis, mod. mitral regurgitation (03/2019)  . COPD (chronic obstructive pulmonary disease) ([place])   . Diabetes mellitus, type 2 ([place])   . Drug  abuse ([place])   . Dupuytren's contracture   . ETOH abuse 2018  . Hypertension   . Mixed hyperlipidemia 01/2018   Social History   Socioeconomic History  . Marital status: Single    Spouse name: Not on file  . Number of children: Not on file  . Years of education: Not on file  . Highest education level: Not on file  Occupational History  . Not on file  Tobacco Use  . Smoking status: Current Every Day Smoker    Packs/day: 1.00    Years: 40.00    Pack years: 40.00    Types: Cigarettes  . Smokeless tobacco: Never Used  Substance and Sexual Activity  . Alcohol use: No    Comment: 2 quarts of beer/day  . Drug use: No    Types: Heroin    Comment: former heroin user (inj)  . Sexual activity: Never  Other Topics Concern  . Not on file  Social History Narrative   [place] 360 Consent Collected   Social Determinants of Health   Financial Resource Strain:   . Difficulty of Paying Living Expenses:   Food Insecurity:   . Worried About Charity fundraiser in the Last Year:   . Arboriculturist in the Last Year:   Transportation Needs:   . Film/video editor (Medical):   Marland Kitchen Lack of Transportation (Non-Medical):   Physical Activity:   . Days of Exercise per Week:   . Minutes of Exercise per Session:   Stress:   . Feeling of Stress :   Social Connections:   . Frequency of Communication with Friends and Family:   . Frequency of Social Gatherings with Friends and Family:   . Attends Religious Services:   . Active Member of Clubs or Organizations:   . Attends Archivist Meetings:   Marland Kitchen Marital Status:    Family History  Problem Relation Age of Onset  . Hypertension Father   . Heart disease Father   . Arthritis Father   . Cancer Father   . Allergic Disorder Mother    Scheduled Meds: . budesonide (PULMICORT) nebulizer solution  0.5 mg Nebulization BID  . chlorhexidine gluconate (MEDLINE KIT)  15 mL Mouth Rinse BID  . Chlorhexidine Gluconate Cloth  6 each Topical Daily    . enoxaparin (LOVENOX) injection  40 mg Subcutaneous Daily  . folic acid  1 mg Intravenous Daily  . insulin aspart  0-15 Units Subcutaneous Q4H  . ipratropium-albuterol  3 mL Nebulization Q4H  . mouth rinse  15 mL Mouth Rinse 10 times per day  . methylPREDNISolone (  SOLU-MEDROL) injection  20 mg Intravenous BID  . pantoprazole (PROTONIX) IV  40 mg Intravenous BID  . PHENObarbital  65 mg Intravenous Q6H   Followed by  . [START ON 02/10/2020] PHENObarbital  65 mg Intravenous TID   Followed by  . [START ON 02/08/2020] PHENObarbital  65 mg Intravenous BID   Followed by  . [START ON 02/09/2020] PHENObarbital  32.5 mg Intravenous BID   Followed by  . [START ON 02/10/2020] PHENObarbital  32.5 mg Intravenous Once  . [START ON 02/09/2020] thiamine  100 mg Oral Daily   Continuous Infusions: . ampicillin-sulbactam (UNASYN) IV Stopped (02/06/20 2952)  . fentaNYL infusion INTRAVENOUS 200 mcg/hr (02/06/20 1018)  . propofol (DIPRIVAN) infusion 20 mcg/kg/min (02/06/20 0956)  . thiamine injection 500 mg (02/06/20 0953)   PRN Meds:.acetaminophen, docusate, fentaNYL, LORazepam, ondansetron (ZOFRAN) IV Medications Prior to Admission:  Prior to Admission medications   Medication Sig Start Date End Date Taking? Authorizing Provider  amLODipine (NORVASC) 10 MG tablet Take 1 tablet (10 mg total) by mouth daily. 03/05/19  Yes Doles-Johnson, Teah, NP  carvedilol (COREG) 6.25 MG tablet Take 1 tablet (6.25 mg total) by mouth 2 (two) times daily. 05/17/19 05/16/20 Yes Kasa, Maretta Bees, MD  cetirizine (ZYRTEC) 10 MG tablet Take 1 tablet by mouth once daily 04/01/19  Yes Doles-Johnson, Teah, NP  hydrOXYzine (ATARAX/VISTARIL) 25 MG tablet Take 25 mg by mouth 2 (two) times daily as needed. 01/29/20  Yes [provider]  losartan (COZAAR) 25 MG tablet Take 1 tablet (25 mg total) by mouth daily. 05/17/19 05/16/20 Yes Kasa, Maretta Bees, MD  SYMBICORT 160-4.5 MCG/ACT inhaler Inhale 2 puffs into the lungs 2 (two) times daily. 03/05/19   Yes Doles-Johnson, Teah, NP  tiotropium (SPIRIVA) 18 MCG inhalation capsule Place 18 mcg into inhaler and inhale daily.   Yes [provider]  diphenhydrAMINE (BENADRYL) 25 mg capsule Take 25 mg by mouth every 6 (six) hours as needed for itching, allergies or sleep.     [provider]  varenicline (CHANTIX STARTING MONTH PAK) 0.5 MG X 11 & 1 MG X 42 tablet Take one 0.5 mg tablet by mouth once daily for 3 days, then increase to one 0.5 mg tablet twice daily for 4 days, then increase to one 1 mg tablet twice daily. Patient not taking: Reported on 02/19/2020 10/02/19   Flora Lipps, MD  VENTOLIN HFA 108 (90 Base) MCG/ACT inhaler INHALE 2 PUFFS BY MOUTH EVERY 6 HOURS AS NEEDED FOR WHEEZING OR SHORTNESS OF BREATH Patient not taking: Reported on 02/02/2020 03/11/19   Nicholes Mango, MD   No Known Allergies Review of Systems  Unable to perform ROS   Physical Exam Constitutional:      Comments: Eyes closed. On vent.      Vital Signs: BP (!) 118/59   Pulse 76   Temp 100 F (37.8 C)   Resp (!) 22   Ht 6' (1.829 m)   Wt 80.6 kg   SpO2 95%   BMI 24.10 kg/m  Pain Scale: CPOT POSS *See Group Information*: 4-INTERVENTION REQUIRED,Unacceptable,Somnolent, mininal or no response to verbal and physical stimulation Pain Score: Asleep   SpO2: SpO2: 95 % O2 Device:SpO2: 95 % O2 Flow Rate: .   IO: Intake/output summary:   Intake/Output Summary (Last 24 hours) at 02/06/2020 1146 Last data filed at 02/06/2020 0900 Gross per 24 hour  Intake 1411.37 ml  Output 935 ml  Net 476.37 ml    LBM: Last BM Date: 02/04/20 Baseline Weight: Weight: 84.8 kg  Most recent weight: Weight: 80.6 kg     Palliative Assessment/Data:     Time In: 11:00 Time Out: 11:30 Time Total: 30 min Greater than 50%  of this time was spent counseling and coordinating care related to the above assessment and plan.  Signed by: Asencion Gowda, NP   Please contact Palliative Medicine Team phone at [phone] for  questions and concerns.  For individual provider: See Shea Evans

## 2020-02-06 NOTE — Progress Notes (Addendum)
CRITICAL CARE NOTE  Acute respiratory failure due to NSTEMI and acute COPD exacerbation s/p cardiac arrest with multiorgan failure with acute systolic CHF exacerbation  SIGNIFICANT EVENTS 4/5 admitted for cardiac arrest 4/6 severe resp failure 4/7 remains critically ill, multiorgan failure    CC  follow up respiratory failure  SUBJECTIVE Patient remains critically ill Prognosis is guarded multiorgan failure Signs of anoxic brain injury Needs MRI for further assessment   BP 124/68   Pulse 95   Temp 99.1 F (37.3 C)   Resp (!) 22   Ht 6' (1.829 m)   Wt 80.6 kg   SpO2 95%   BMI 24.10 kg/m    I/O last 3 completed shifts: In: 2190.7 [I.V.:1440.7; IV Piggyback:749.9] Out: 1900 [Urine:1440; Emesis/NG output:210; Other:250] No intake/output data recorded.  SpO2: 95 % FiO2 (%): 28 %  Estimated body mass index is 24.1 kg/m as calculated from the following:   Height as of this encounter: 6' (1.829 m).   Weight as of this encounter: 80.6 kg.  SIGNIFICANT EVENTS   REVIEW OF SYSTEMS  PATIENT IS UNABLE TO PROVIDE COMPLETE REVIEW OF SYSTEMS DUE TO SEVERE CRITICAL ILLNESS        PHYSICAL EXAMINATION:  GENERAL:critically ill appearing, +resp distress HEAD: Normocephalic, atraumatic.  EYES:PINPOINT  Pupils   No scleral icterus.  MOUTH: Moist mucosal membrane. NECK: Supple.  PULMONARY: +rhonchi, +wheezing CARDIOVASCULAR: S1 and S2. Regular rate and rhythm. No murmurs, rubs, or gallops.  GASTROINTESTINAL: Soft, nontender, -distended.  Positive bowel sounds.   MUSCULOSKELETAL: No swelling, clubbing, or edema.  NEUROLOGIC: obtunded, GCS<8 SKIN:intact,warm,dry  MEDICATIONS: I have reviewed all medications and confirmed regimen as documented   CULTURE RESULTS   Recent Results (from the past 240 hour(s))  Respiratory Panel by RT PCR (Flu A&B, Covid) - Nasopharyngeal Swab     Status: None   Collection Time: 02/23/2020  7:20 PM   Specimen: Nasopharyngeal Swab   Result Value Ref Range Status   SARS Coronavirus 2 by RT PCR NEGATIVE NEGATIVE Final    Comment: (NOTE) SARS-CoV-2 target nucleic acids are NOT DETECTED. The SARS-CoV-2 RNA is generally detectable in upper respiratoy specimens during the acute phase of infection. The lowest concentration of SARS-CoV-2 viral copies this assay can detect is 131 copies/mL. A negative result does not preclude SARS-Cov-2 infection and should not be used as the sole basis for treatment or other patient management decisions. A negative result may occur with  improper specimen collection/handling, submission of specimen other than nasopharyngeal swab, presence of viral mutation(s) within the areas targeted by this assay, and inadequate number of viral copies (<131 copies/mL). A negative result must be combined with clinical observations, patient history, and epidemiological information. The expected result is Negative. Fact Sheet for Patients:  PinkCheek.be Fact Sheet for Healthcare Providers:  GravelBags.it This test is not yet ap proved or cleared by the [place] FDA and  has been authorized for detection and/or diagnosis of SARS-CoV-2 by FDA under an Emergency Use Authorization (EUA). This EUA will remain  in effect (meaning this test can be used) for the duration of the COVID-19 declaration under Section 564(b)(1) of the Act, 21 U.S.C. section 360bbb-3(b)(1), unless the authorization is terminated or revoked sooner.    Influenza A by PCR NEGATIVE NEGATIVE Final   Influenza B by PCR NEGATIVE NEGATIVE Final    Comment: (NOTE) The Xpert Xpress SARS-CoV-2/FLU/RSV assay is intended as an aid in  the diagnosis of influenza from Nasopharyngeal swab specimens and  should not be  used as a sole basis for treatment. Nasal washings and  aspirates are unacceptable for Xpert Xpress SARS-CoV-2/FLU/RSV  testing. Fact Sheet for  Patients: PinkCheek.be Fact Sheet for Healthcare Providers: GravelBags.it This test is not yet approved or cleared by the [place] FDA and  has been authorized for detection and/or diagnosis of SARS-CoV-2 by  FDA under an Emergency Use Authorization (EUA). This EUA will remain  in effect (meaning this test can be used) for the duration of the  Covid-19 declaration under Section 564(b)(1) of the Act, 21  U.S.C. section 360bbb-3(b)(1), unless the authorization is  terminated or revoked. Performed at [facility], [address], [place], [place] [address]   Urine Culture     Status: None   Collection Time: 02/24/2020  7:20 PM   Specimen: Urine, Random  Result Value Ref Range Status   Specimen Description   Final    URINE, RANDOM Performed at [facility], [address], [place], [place] [address]    Special Requests   Final    NONE Performed at [facility], [address] [place], [place] [address]    Culture   Final    NO GROWTH Performed at [facility], [place] [address] [place], [place] [address]    Report Status 02/05/2020 FINAL  Final  Blood culture (routine x 2)     Status: None (Preliminary result)   Collection Time: 01/30/2020  9:00 PM   Specimen: BLOOD  Result Value Ref Range Status   Specimen Description BLOOD BLOOD RIGHT FOREARM  Final   Special Requests   Final    BOTTLES DRAWN AEROBIC AND ANAEROBIC Blood Culture results may not be optimal due to an inadequate volume of blood received in culture bottles   Culture   Final    NO GROWTH 3 DAYS Performed at [facility], [address], [place], [place] [address]    Report Status PENDING  Incomplete  Blood culture (routine x 2)     Status: None (Preliminary result)   Collection Time: 02/17/2020  9:01 PM   Specimen: BLOOD  Result Value Ref Range Status   Specimen Description BLOOD BLOOD LEFT HAND  Final    Special Requests   Final    BOTTLES DRAWN AEROBIC AND ANAEROBIC Blood Culture results may not be optimal due to an inadequate volume of blood received in culture bottles   Culture   Final    NO GROWTH 3 DAYS Performed at [facility], [address], [place], [place] [address]    Report Status PENDING  Incomplete  MRSA PCR Screening     Status: None   Collection Time: 02/04/20 12:02 AM   Specimen: Nasopharyngeal  Result Value Ref Range Status   MRSA by PCR NEGATIVE NEGATIVE Final    Comment:        The GeneXpert MRSA Assay (FDA approved for NASAL specimens only), is one component of a comprehensive MRSA colonization surveillance program. It is not intended to diagnose MRSA infection nor to guide or monitor treatment for MRSA infections. Performed at [facility], [address] [place], [place] [address]   Culture, respiratory     Status: None (Preliminary result)   Collection Time: 02/04/20 11:00 AM   Specimen: Tracheal Aspirate  Result Value Ref Range Status   Specimen Description   Final    TRACHEAL ASPIRATE Performed at [facility], [address], [place], [place] [address]    Special Requests  Final    NONE Performed at [facility], [place], [place] [address]    Gram Stain   Final    ABUNDANT WBC PRESENT,BOTH PMN AND MONONUCLEAR NO ORGANISMS SEEN    Culture   Final    NO GROWTH < 24 HOURS Performed at [facility], [place] [address] [place], [place] [address]    Report Status PENDING  Incomplete          IMAGING    No results found.   Nutrition Status: Nutrition Problem: Inadequate oral intake Etiology: inability to eat(pt sedated and ventilated) Signs/Symptoms: NPO status    BMP Latest Ref Rng & Units 02/05/2020 02/04/2020 02/04/2020  Glucose 70 - 99 mg/dL 156(H) 124(H) 201(H)  BUN 8 - 23 mg/dL _0 Creatinine 0.61 - 1.24 mg/dL 1.19 1.27(H) 1.26(H)  BUN/Creat Ratio 10 - 24 - - -   Sodium 135 - 145 mmol/L 131(L) 133(L) 130(L)  Potassium 3.5 - 5.1 mmol/L 4.2 4.3 4.8  Chloride 98 - 111 mmol/L 96(L) 101 95(L)  CO2 22 - 32 mmol/L _1 Calcium 8.9 - 10.3 mg/dL 7.1(L) 6.9(L) 6.7(L)       Indwelling Urinary Catheter continued, requirement due to   Reason to continue Indwelling Urinary Catheter strict Intake/Output monitoring for hemodynamic instability   Central Line/ continued, requirement due to  Reason to continue [place] of central venous pressure or other hemodynamic parameters and poor IV access   Ventilator continued, requirement due to severe respiratory failure   Ventilator Sedation RASS 0 to -2      ASSESSMENT AND PLAN SYNOPSIS Acute respiratory failure due to NSTEMI and acute COPD exacerbation s/p cardiac arrest with multiorgan failure with acute systolic CHF exacerbation   Severe ACUTE Hypoxic and Hypercapnic Respiratory Failure -continue Full MV support -continue Bronchodilator Therapy -Wean Fio2 and PEEP as tolerated -will perform SAT/SBT when respiratory parameters are met -VAP/VENT bundle implementation  ACUTE SYSTOLIC CARDIAC FAILURE- EF 30% NSTEMI-on heparin -oxygen as needed -Lasix as tolerated -follow up cardiac enzymes as indicated -follow up cardiology recs    ACUTE KIDNEY INJURY/Renal Failure -follow chem 7 -follow UO -continue Foley Catheter-assess need -Avoid nephrotoxic agents -Recheck creatinine     NEUROLOGY Signs of anoxic brain injury MRI PENDING  CARDIAC ICU monitoring  ID -continue IV abx as prescibed -follow up cultures  GI GI PROPHYLAXIS as indicated  NUTRITIONAL STATUS Nutrition Status: Nutrition Problem: Inadequate oral intake Etiology: inability to eat(pt sedated and ventilated) Signs/Symptoms: NPO status     DIET-->TF's as tolerated Constipation protocol as indicated  ENDO - will use ICU hypoglycemic\Hyperglycemia protocol if indicated   ELECTROLYTES -follow  labs as needed -replace as needed -pharmacy consultation and following   DVT/GI PRX ordered TRANSFUSIONS AS NEEDED MONITOR FSBS ASSESS the need for LABS as needed  PALLIATIVE CARE CONSULTED RECOMMEND DNR STATUS AND COMFORT CARE  Critical Care Time devoted to patient care services described in this note is 32 minutes.   Overall, patient is critically ill, prognosis is guarded.  Patient with Multiorgan failure and at high risk for cardiac arrest and death.    Corrin Parker, M.D.  [facility]  Medical Director [place] Director [facility] Cardio-Pulmonary Department

## 2020-02-06 NOTE — Progress Notes (Signed)
Patient remains unresponsive.  Transferred patient to MRI for scan of the head.  Patient family met with Palliative care today and received the results of the MRI as well.  Explained dying process to patient family.  Comfort care was ordered.

## 2020-02-06 NOTE — Progress Notes (Signed)
[facility]    SUBJECTIVE: Patient intubated and sedated.   Vitals:   02/06/20 0400 02/06/20 0500 02/06/20 0600 02/06/20 0700  BP: (!) 113/57 124/62 134/71 124/68  Pulse: 80 99 99 95  Resp: (!) 22 (!) 23 (!) 25 (!) 22  Temp: 97.7 F (36.5 C) 97.7 F (36.5 C) 98.2 F (36.8 C) 99.1 F (37.3 C)  TempSrc:      SpO2: 93% 91% 94% 95%  Weight:      Height:         Intake/Output Summary (Last 24 hours) at 02/06/2020 0802 Last data filed at 02/06/2020 0700 Gross per 24 hour  Intake 1515.67 ml  Output 1125 ml  Net 390.67 ml      PHYSICAL EXAM  General: Critically ill appearing lying in bed sedated and on ventilator HEENT:  Normocephalic and atramatic Neck:   No JVD.  Lungs: vent suppor Heart: regular rate and rhythm. Normal S1 and S2 without gallops or murmurs.  Abdomen: nondistended Extremities: No clubbing, cyanosis or edema.   Neuro: sedated   LABS: Basic Metabolic Panel: Recent Labs     0000 02/04/20 0601 02/04/20 0601 02/05/20 0738 02/05/20 0837 02/06/20 0520  NA  --  133*  --  131*  --   --   K  --  4.3  --  4.2  --   --   CL  --  101  --  96*  --   --   CO2  --  24  --  24  --   --   GLUCOSE  --  124*  --  156*  --   --   BUN  --  14  --  17  --   --   CREATININE  --  1.27*  --  1.19  --   --   CALCIUM  --  6.9*  --  7.1*  --   --   MG  --  1.9   < >  --  2.3 2.5*  PHOS   < >  --   --   --  4.2 3.2   < > = values in this interval not displayed.   Liver Function Tests: Recent Labs    2020-02-06 1919  AST 93*  ALT 64*  ALKPHOS 69  BILITOT 0.7  PROT 5.8*  ALBUMIN 3.3*   No results for input(s): LIPASE, AMYLASE in the last 72 hours. CBC: Recent Labs    2020-02-06 1919 06-Feb-2020 1919 02/04/20 0037 02/04/20 0801 02/05/20 0738 02/06/20 0520  WBC 8.1   < > 13.6*  --   --  15.8*  NEUTROABS 2.2  --   --   --   --   --   HGB 13.8   < > 14.8   < > 13.5 12.4*  HCT 41.4   < > 42.9   < > 39.1 36.4*  MCV 99.3   < > 96.4  --   --  98.9  PLT 196   < >  266  --   --  200   < > = values in this interval not displayed.   Cardiac Enzymes: No results for input(s): CKTOTAL, CKMB, CKMBINDEX, TROPONINI in the last 72 hours. BNP: Invalid input(s): POCBNP D-Dimer: No results for input(s): DDIMER in the last 72 hours. Hemoglobin A1C: Recent Labs    02-06-20 2100  HGBA1C 5.7*   Fasting Lipid Panel: Recent Labs    02/06/20 0520  TRIG 371*  Thyroid Function Tests: No results for input(s): TSH, T4TOTAL, T3FREE, THYROIDAB in the last 72 hours.  Invalid input(s): FREET3 Anemia Panel: No results for input(s): VITAMINB12, FOLATE, FERRITIN, TIBC, IRON, RETICCTPCT in the last 72 hours.  ECHOCARDIOGRAM COMPLETE  Result Date: 02/04/2020    ECHOCARDIOGRAM REPORT   Patient Name:   Brandon Bolton Marsiglia Date of Exam: 02/04/2020 Medical Rec #:  [id number]   Height:       72.0 in Accession #:    [id number]  Weight:       186.9 lb Date of Birth:  [birth year]   BSA:          2.070 m Patient Age:    64 years    BP:           96/61 mmHg Patient Gender: M           HR:           82 bpm. Exam Location:  [facility] Procedure: 2D Echo and Intracardiac Opacification Agent Indications:     Cardiac Arrest  History:         Patient has prior history of Echocardiogram examinations. CHF,                  COPD; Risk Factors:Hypertension, Diabetes and Dyslipidemia.  Sonographer:     Enrique Sack Thornton-Maynard Referring Phys:  [id number] Alphonzo Lemmings F DAVIS Diagnosing Phys: Yvonne Kendall MD  Sonographer Comments: Image acquisition challenging due to COPD. IMPRESSIONS  1. Left ventricular ejection fraction, by estimation, is 35 to 40%. The left ventricle has moderately decreased function. Left ventricular endocardial border not optimally defined to evaluate regional wall motion. There is mild asymmetric left ventricular hypertrophy of the basal-septal segment. Left ventricular diastolic parameters are consistent with Grade I diastolic dysfunction (impaired relaxation). Elevated left atrial pressure.  2. Right  ventricular systolic function is mildly reduced. The right ventricular size is normal. Tricuspid regurgitation signal is inadequate for assessing PA pressure.  3. The mitral valve was not well visualized. Mild mitral valve regurgitation. No evidence of mitral stenosis.  4. The aortic valve was not well visualized. Aortic valve regurgitation is mild to moderate. No aortic stenosis is present.  5. Pulmonic valve regurgitation not well assessed.  6. The inferior vena cava is normal in size with greater than 50% respiratory variability, suggesting right atrial pressure of 3 mmHg. FINDINGS  Left Ventricle: Left ventricular ejection fraction, by estimation, is 35 to 40%. The left ventricle has moderately decreased function. Left ventricular endocardial border not optimally defined to evaluate regional wall motion. Definity contrast agent was given IV to delineate the left ventricular endocardial borders. The left ventricular internal cavity size was normal in size. There is mild asymmetric left ventricular hypertrophy of the basal-septal segment. Abnormal (paradoxical) septal motion, consistent with left bundle branch block. Left ventricular diastolic parameters are consistent with Grade I diastolic dysfunction (impaired relaxation). Elevated left atrial pressure.  LV Wall Scoring: The mid and distal anterior wall, mid inferoseptal segment, and apical septal segment are akinetic. The entire lateral wall, anterior septum, entire inferior wall, basal anterior segment, and basal inferoseptal segment are hypokinetic. Right Ventricle: The right ventricular size is normal. No increase in right ventricular wall thickness. Right ventricular systolic function is mildly reduced. Tricuspid regurgitation signal is inadequate for assessing PA pressure. Left Atrium: Left atrial size was normal in size. Right Atrium: Right atrial size was not well visualized. Pericardium: There is no evidence of pericardial effusion. Mitral Valve: The  mitral valve was  not well visualized. Mild mitral annular calcification. Mild mitral valve regurgitation. No evidence of mitral valve stenosis. MV peak gradient, 4.8 mmHg. The mean mitral valve gradient is 2.0 mmHg. Tricuspid Valve: The tricuspid valve is not well visualized. Tricuspid valve regurgitation is trivial. Aortic Valve: The aortic valve was not well visualized. Aortic valve regurgitation is mild to moderate. Aortic regurgitation PHT measures 470 msec. No aortic stenosis is present. Aortic valve mean gradient measures 7.0 mmHg. Aortic valve peak gradient measures 11.3 mmHg. Aortic valve area, by VTI measures 1.40 cm. Pulmonic Valve: The pulmonic valve was not well visualized. Pulmonic valve regurgitation not well assessed. No evidence of pulmonic stenosis. Aorta: The aortic root is normal in size and structure. Pulmonary Artery: The pulmonary artery is not well seen. Venous: The inferior vena cava is normal in size with greater than 50% respiratory variability, suggesting right atrial pressure of 3 mmHg. IAS/Shunts: The interatrial septum was not well visualized.  LEFT VENTRICLE PLAX 2D LVIDd:         4.89 cm      Diastology LVIDs:         4.16 cm      LV e' lateral:   5.59 cm/s LV PW:         0.95 cm      LV E/e' lateral: 12.3 LV IVS:        1.32 cm      LV e' medial:    4.05 cm/s LVOT diam:     1.80 cm      LV E/e' medial:  17.0 LV SV:         41 LV SV Index:   20 LVOT Area:     2.54 cm  LV Volumes (MOD) LV vol d, MOD A2C: 134.0 ml LV vol d, MOD A4C: 56.5 ml LV vol s, MOD A2C: 78.5 ml LV vol s, MOD A4C: 48.3 ml LV SV MOD A2C:     55.5 ml LV SV MOD A4C:     56.5 ml LV SV MOD BP:      43.0 ml RIGHT VENTRICLE RV S prime:     9.16 cm/s TAPSE (M-mode): 1.9 cm LEFT ATRIUM             Index LA Vol (A2C):   45.4 ml 21.93 ml/m LA Vol (A4C):   48.1 ml 23.23 ml/m LA Biplane Vol: 47.3 ml 22.85 ml/m  AORTIC VALVE AV Area (Vmax):    1.54 cm AV Area (Vmean):   1.47 cm AV Area (VTI):     1.40 cm AV Vmax:            168.00 cm/s AV Vmean:          120.500 cm/s AV VTI:            0.294 m AV Peak Grad:      11.3 mmHg AV Mean Grad:      7.0 mmHg LVOT Vmax:         102.00 cm/s LVOT Vmean:        69.800 cm/s LVOT VTI:          0.162 m LVOT/AV VTI ratio: 0.55 AI PHT:            470 msec MITRAL VALVE MV Peak grad: 4.8 mmHg     SHUNTS MV Mean grad: 2.0 mmHg     Systemic VTI:  0.16 m MV Vmax:      1.10 m/s     Systemic Diam: 1.80  cm MV Vmean:     69.6 cm/s MV E velocity: 68.90 cm/s MV A velocity: 97.50 cm/s MV E/A ratio:  0.71 Christopher End MD Electronically signed by Yvonne Kendall MD Signature Date/Time: 02/04/2020/2:31:26 PM    Final      Echo LVEF 35-40%, mild to moderate AI, mild MR  TELEMETRY: sinus rhythm, 83 bpm  ASSESSMENT AND PLAN:  Active Problems:   Cardiac arrest (HCC)   1. Acute respiratory failure secondary to NSTEMI and acute COPD exacerbation, status post cardiac arrest with unknown downtime. ECG revealed sinus rhythm with known LBBB. High sensitivity troponin peaked at >25,000, and has down trended to 5567. Patient currently on heparin drip.  2. Alcoholic cardiomyopathy, diagnosed 05/2019, EF 30-35%, ETOH on admission 47 3. Chronic systolic CHF  Recommendations: 1. Agree with current therapy 2. Discontinue heparin drip now; patient has been on for 48 hours. 3. Defer further cardiac diagnostics at this time. If patient's neurological status improves, will consider cardiac catheterization.  Sign off for now; please call if patient's neurological status improves. Discussed with Dr. Darrold Junker who agrees with this plan.  Brandon Ivanoff, PA-C 02/06/2020 8:02 AM

## 2020-02-06 NOTE — Progress Notes (Signed)
Pt was transported to Mri and back to CCU while on the vent.

## 2020-02-07 ENCOUNTER — Inpatient Hospital Stay: Payer: Medicaid Other

## 2020-02-07 LAB — CULTURE, RESPIRATORY W GRAM STAIN

## 2020-02-07 LAB — GLUCOSE, CAPILLARY
Glucose-Capillary: 133 mg/dL — ABNORMAL HIGH (ref 70–99)
Glucose-Capillary: 103 mg/dL — ABNORMAL HIGH (ref 70–99)

## 2020-02-07 LAB — RESPIRATORY PANEL BY RT PCR (FLU A&B, COVID)
Influenza A by PCR: NEGATIVE
Influenza B by PCR: NEGATIVE
SARS Coronavirus 2 by RT PCR: NEGATIVE

## 2020-02-07 LAB — URINALYSIS, COMPLETE (UACMP) WITH MICROSCOPIC
Bilirubin Urine: NEGATIVE
Glucose, UA: 50 mg/dL — AB
Hgb urine dipstick: NEGATIVE
Ketones, ur: NEGATIVE mg/dL
Leukocytes,Ua: NEGATIVE
Nitrite: NEGATIVE
Protein, ur: NEGATIVE mg/dL
Specific Gravity, Urine: 1.027 (ref 1.005–1.030)
pH: 5 (ref 5.0–8.0)

## 2020-02-07 MED ORDER — MORPHINE 100MG IN NS 100ML (1MG/ML) PREMIX INFUSION: 0.0000 mg/h | INTRAVENOUS | Status: DC

## 2020-02-07 MED ORDER — MORPHINE SULFATE (PF) 2 MG/ML IV SOLN: 2.0000 mg | INTRAVENOUS | Status: DC | PRN

## 2020-02-07 MED ORDER — ACETAMINOPHEN 325 MG PO TABS: 650.0000 mg | ORAL_TABLET | Freq: Four times a day (QID) | ORAL | Status: DC | PRN

## 2020-02-07 MED ORDER — MIDAZOLAM HCL 2 MG/2ML IJ SOLN: 2.0000 mg | INTRAMUSCULAR | Status: DC | PRN

## 2020-02-07 MED ORDER — ACETAMINOPHEN 650 MG RE SUPP: 650.0000 mg | Freq: Four times a day (QID) | RECTAL | Status: DC | PRN

## 2020-02-07 MED ORDER — MORPHINE BOLUS VIA INFUSION: 5.0000 mg | INTRAVENOUS | Status: DC | PRN

## 2020-02-07 MED ORDER — DEXTROSE 5 % IV SOLN: INTRAVENOUS | Status: DC

## 2020-02-07 MED ORDER — GLYCOPYRROLATE 1 MG PO TABS: 1.0000 mg | ORAL_TABLET | ORAL | Status: DC | PRN

## 2020-02-07 MED ORDER — DIPHENHYDRAMINE HCL 50 MG/ML IJ SOLN: 25.0000 mg | INTRAMUSCULAR | Status: DC | PRN

## 2020-02-07 MED ORDER — POLYVINYL ALCOHOL 1.4 % OP SOLN: 1.0000 [drp] | Freq: Four times a day (QID) | OPHTHALMIC | Status: DC | PRN

## 2020-02-07 MED ORDER — GLYCOPYRROLATE 0.2 MG/ML IJ SOLN: 0.2000 mg | INTRAMUSCULAR | Status: DC | PRN

## 2020-02-08 LAB — URINE CULTURE: Culture: NO GROWTH

## 2020-02-08 LAB — CULTURE, BLOOD (ROUTINE X 2)
Culture: NO GROWTH
Culture: NO GROWTH

## 2020-02-09 LAB — CULTURE, RESPIRATORY W GRAM STAIN

## 2020-02-12 LAB — CULTURE, BLOOD (ROUTINE X 2): Culture: NO GROWTH

## 2020-02-29 NOTE — Death Summary Note (Signed)
DEATH SUMMARY   Patient Details  Name: Brandon Bolton MRN: [id number] DOB: [birth year]  Admission/Discharge Information   Admit Date:  2020-02-15  Date of Death:  02-19-20  Time of Death:  20:55  Length of Stay: 4  Referring Physician: [facility], NP   Reason(s) for Hospitalization  Acute cardiac arrest and NSTEMI  Diagnoses  Preliminary cause of death: ISCHEMIC CARDIOMYOPATHY, ANOXIC BRAIN INJURY,  COPD Secondary Diagnoses (including complications and co-morbidities):  Active Problems:   Cardiac arrest [facility])   Brief Hospital Course (including significant findings, care, treatment, and services provided and events leading to death)  Acute respiratory failure due to NSTEMI and acute COPD exacerbation s/p cardiac arrest with multiorgan failure with acute systolic CHF exacerbation  SIGNIFICANT EVENTS 4/5 admitted for cardiac arrest 4/6 severe resp failure 4/7 remains critically ill, multiorgan failure 4/8 patient is DNR/DNI, plan for comfort measures today   Acute respiratory failure due to NSTEMI and acute COPD exacerbation s/p cardiac arrest with multiorgan failure with acute systolic CHF exacerbation Patient with severe brain damage  Plan for CMO today  ACUTE SYSTOLIC CARDIAC FAILURE- EF 30% -oxygen as needed -Lasix as tolerated +NSTEMI    ACUTE KIDNEY INJURY/Renal Failure -follow chem 7 -follow UO -continue Foley Catheter-assess need -Avoid nephrotoxic agents -Recheck creatinine    NEUROLOGY Severe brain damage   CARDIAC ICU monitoring  ID -continue IV abx as prescibed -follow up cultures  GI GI PROPHYLAXIS as indicated  NUTRITIONAL STATUS Nutrition Status: Nutrition Problem: Inadequate oral intake Etiology: inability to eat(pt sedated and ventilated) Signs/Symptoms: NPO status   DIET-->TF's as tolerated Constipation protocol as indicated  ENDO - will use ICU hypoglycemic\Hyperglycemia protocol if  indicated   ELECTROLYTES -follow labs as needed -replace as needed -pharmacy consultation and following   DVT/GI PRX ordered TRANSFUSIONS AS NEEDED MONITOR FSBS ASSESS the need for LABS as needed   Overall, patient is critically ill, prognosis is guarded.  Patient with Multiorgan failure and at high risk for cardiac arrest and death.    Family At bedside, clinical status relayed to family  Updated and notified of patients medical condition-  Progressive multiorgan failure with very low chance of meaningful recovery.  Patient is in dying  Process.  Family understands the situation.  They have consented and agreed to DNR/DNI and would like to proceed with Comfort care measures.   Family are satisfied with Plan of action and management. All questions answered     Pertinent Labs and Studies  Significant Diagnostic Studies CT ABDOMEN PELVIS WO CONTRAST  Result Date: February 19, 2020 CLINICAL DATA:  Status post cardiac arrest. Organ donation. EXAM: CT CHEST, ABDOMEN AND PELVIS WITHOUT CONTRAST TECHNIQUE: Multidetector CT imaging of the chest, abdomen and pelvis was performed following the standard protocol without IV contrast. COMPARISON:  None. FINDINGS: CT CHEST FINDINGS Cardiovascular: Heart size appears to be normal in size. Coronary artery calcifications are noted. Mild atherosclerotic changes are noted of the thoracic aorta without evidence for an aneurysm. There is no significant pericardial effusion. There is a right-sided central venous catheter in place with tip terminating in the distal SVC. Mediastinum/Nodes: --No mediastinal or hilar lymphadenopathy. --No axillary lymphadenopathy. --No supraclavicular lymphadenopathy. --Normal thyroid gland. --The esophagus is unremarkable Lungs/Pleura: Emphysematous changes are noted bilaterally. There is a 4 mm pulmonary nodule in the right upper lobe (axial series 4, image 44). There is a spiculated pulmonary nodule in the superior  segment of the right lower lobe (axial series 4, image 63). There is a sub  solid 8 mm pulmonary nodule in the peripheral right upper lobe (axial series 4, image 59). Additional scattered bilateral small pulmonary nodules are noted. There is no pneumothorax. There is bronchial wall thickening and mucus plugging involving the lower airways bilaterally. The endotracheal tube terminates above the carina. There are small bilateral pleural effusions, right greater than left. There is atelectasis at the right lung base. Musculoskeletal: There are bilateral nondisplaced anterior buckle fractures of multiple ribs consistent with the patient's history of cardiopulmonary arrest status post CPR. CT ABDOMEN PELVIS FINDINGS Hepatobiliary: The liver is normal. There is diffuse gallbladder wall thickening.There is no biliary ductal dilation. Pancreas: Normal contours without ductal dilatation. No peripancreatic fluid collection. Spleen: Unremarkable. Adrenals/Urinary Tract: --Adrenal glands: Unremarkable. --Right kidney/ureter: No hydronephrosis or radiopaque kidney stones. --Left kidney/ureter: No hydronephrosis or radiopaque kidney stones. --Urinary bladder: The urinary bladder is decompressed with a Foley catheter. Stomach/Bowel: --Stomach/Duodenum: No hiatal hernia or other gastric abnormality. Normal duodenal course and caliber. --Small bowel: Unremarkable. --Colon: Rectosigmoid diverticulosis without acute inflammation. --Appendix: Normal. Vascular/Lymphatic: Atherosclerotic calcification is present within the non-aneurysmal abdominal aorta, without hemodynamically significant stenosis. --No retroperitoneal lymphadenopathy. --No mesenteric lymphadenopathy. --No pelvic or inguinal lymphadenopathy. Reproductive: Unremarkable Other: There is a trace amount of free fluid in the abdomen. No free air. The abdominal wall is normal. Musculoskeletal. No acute displaced fractures. IMPRESSION: 1. Evaluation is limited by lack of IV  contrast and streak artifact through the patient's chest, abdomen, and pelvis. 2. Bilateral nondisplaced anterior buckle fractures of multiple ribs consistent with the patient's history of cardiopulmonary arrest status status post CPR. 3. There is a spiculated nodule in the right lower lobe. While this may be post infectious or inflammatory, is concerning for bronchogenic carcinoma. 4. There are additional scattered bilateral pulmonary nodules which may be post infectious or inflammatory. 5. There is bronchial wall thickening and mucus plugging involving the lower airways bilaterally which can be seen in patients with infectious or inflammatory bronchiolitis. There may be an underlying component of aspiration. 6. Diffuse gallbladder wall thickening which is nonspecific and may be secondary to the patient's volume status or heart failure. Underlying acute cholecystitis is not excluded. Consider further evaluation with ultrasound as clinically indicated. 7. Rectosigmoid diverticulosis without acute inflammation. 8. Trace amount of free fluid in the abdomen. 9. Small bilateral pleural effusions. 10. Emphysema. Aortic Atherosclerosis (ICD10-I70.0) and Emphysema (ICD10-J43.9). Electronically Signed   By: Katherine Mantle M.D.   On: 01/31/2020 01:28   DG Abdomen 1 View  Result Date: 02/18/2020 CLINICAL DATA:  OG tube placement EXAM: ABDOMEN - 1 VIEW COMPARISON:  None. FINDINGS: OG tube tip is in the stomach. Nonobstructive bowel gas pattern. Visualized lung bases clear. IMPRESSION: OG tube tip in the stomach. Electronically Signed   By: Charlett Nose M.D.   On: 02/18/20 19:47   CT Head Wo Contrast  Result Date: 2020-02-18 CLINICAL DATA:  63 year old male with cardiac arrest and encephalopathy. EXAM: CT HEAD WITHOUT CONTRAST CT CERVICAL SPINE WITHOUT CONTRAST TECHNIQUE: Multidetector CT imaging of the head and cervical spine was performed following the standard protocol without intravenous contrast. Multiplanar CT  image reconstructions of the cervical spine were also generated. COMPARISON:  None. FINDINGS: Evaluation of this exam is limited due to motion artifact. CT HEAD FINDINGS Brain: There is mild age-related atrophy and chronic microvascular ischemic changes. Probable small old left caudate head infarct as well as focal area of old infarct involving the right cerebellum. There is no acute intracranial hemorrhage. No mass effect or midline shift.  No extra-axial fluid collection. Vascular: No hyperdense vessel or unexpected calcification. Skull: Normal. Negative for fracture or focal lesion. Sinuses/Orbits: There is diffuse mucoperiosteal thickening of paranasal sinuses. The mastoid air cells are clear. Other: None CT CERVICAL SPINE FINDINGS Alignment: No acute subluxation. There is mild reversal of normal cervical lordosis which may be positional or due to muscle spasm or secondary to degenerative changes. Skull base and vertebrae: No definite acute fracture. Soft tissues and spinal canal: No prevertebral fluid or swelling. No visible canal hematoma. Disc levels: Multilevel degenerative changes with endplate irregularity and disc space narrowing. Upper chest: Left apical subpleural density, likely atelectasis. Other: Partially visualized endotracheal and enteric tube as well as right IJ central venous line. Bilateral carotid bulb calcified plaques. IMPRESSION: 1. No acute intracranial pathology. 2. Age-related atrophy and chronic microvascular ischemic changes. Old right cerebellar infarct. 3. No acute/traumatic cervical spine pathology. Electronically Signed   By: Elgie Collard M.D.   On: 02/29/2020 23:53   CT CHEST WO CONTRAST  Result Date: 02/27/2020 CLINICAL DATA:  Status post cardiac arrest. Organ donation. EXAM: CT CHEST, ABDOMEN AND PELVIS WITHOUT CONTRAST TECHNIQUE: Multidetector CT imaging of the chest, abdomen and pelvis was performed following the standard protocol without IV contrast. COMPARISON:  None.  FINDINGS: CT CHEST FINDINGS Cardiovascular: Heart size appears to be normal in size. Coronary artery calcifications are noted. Mild atherosclerotic changes are noted of the thoracic aorta without evidence for an aneurysm. There is no significant pericardial effusion. There is a right-sided central venous catheter in place with tip terminating in the distal SVC. Mediastinum/Nodes: --No mediastinal or hilar lymphadenopathy. --No axillary lymphadenopathy. --No supraclavicular lymphadenopathy. --Normal thyroid gland. --The esophagus is unremarkable Lungs/Pleura: Emphysematous changes are noted bilaterally. There is a 4 mm pulmonary nodule in the right upper lobe (axial series 4, image 44). There is a spiculated pulmonary nodule in the superior segment of the right lower lobe (axial series 4, image 63). There is a sub solid 8 mm pulmonary nodule in the peripheral right upper lobe (axial series 4, image 59). Additional scattered bilateral small pulmonary nodules are noted. There is no pneumothorax. There is bronchial wall thickening and mucus plugging involving the lower airways bilaterally. The endotracheal tube terminates above the carina. There are small bilateral pleural effusions, right greater than left. There is atelectasis at the right lung base. Musculoskeletal: There are bilateral nondisplaced anterior buckle fractures of multiple ribs consistent with the patient's history of cardiopulmonary arrest status post CPR. CT ABDOMEN PELVIS FINDINGS Hepatobiliary: The liver is normal. There is diffuse gallbladder wall thickening.There is no biliary ductal dilation. Pancreas: Normal contours without ductal dilatation. No peripancreatic fluid collection. Spleen: Unremarkable. Adrenals/Urinary Tract: --Adrenal glands: Unremarkable. --Right kidney/ureter: No hydronephrosis or radiopaque kidney stones. --Left kidney/ureter: No hydronephrosis or radiopaque kidney stones. --Urinary bladder: The urinary bladder is decompressed  with a Foley catheter. Stomach/Bowel: --Stomach/Duodenum: No hiatal hernia or other gastric abnormality. Normal duodenal course and caliber. --Small bowel: Unremarkable. --Colon: Rectosigmoid diverticulosis without acute inflammation. --Appendix: Normal. Vascular/Lymphatic: Atherosclerotic calcification is present within the non-aneurysmal abdominal aorta, without hemodynamically significant stenosis. --No retroperitoneal lymphadenopathy. --No mesenteric lymphadenopathy. --No pelvic or inguinal lymphadenopathy. Reproductive: Unremarkable Other: There is a trace amount of free fluid in the abdomen. No free air. The abdominal wall is normal. Musculoskeletal. No acute displaced fractures. IMPRESSION: 1. Evaluation is limited by lack of IV contrast and streak artifact through the patient's chest, abdomen, and pelvis. 2. Bilateral nondisplaced anterior buckle fractures of multiple ribs consistent with the patient's history  of cardiopulmonary arrest status status post CPR. 3. There is a spiculated nodule in the right lower lobe. While this may be post infectious or inflammatory, is concerning for bronchogenic carcinoma. 4. There are additional scattered bilateral pulmonary nodules which may be post infectious or inflammatory. 5. There is bronchial wall thickening and mucus plugging involving the lower airways bilaterally which can be seen in patients with infectious or inflammatory bronchiolitis. There may be an underlying component of aspiration. 6. Diffuse gallbladder wall thickening which is nonspecific and may be secondary to the patient's volume status or heart failure. Underlying acute cholecystitis is not excluded. Consider further evaluation with ultrasound as clinically indicated. 7. Rectosigmoid diverticulosis without acute inflammation. 8. Trace amount of free fluid in the abdomen. 9. Small bilateral pleural effusions. 10. Emphysema. Aortic Atherosclerosis (ICD10-I70.0) and Emphysema (ICD10-J43.9).  Electronically Signed   By: Katherine Mantle M.D.   On: 02/06/2020 01:28   CT Cervical Spine Wo Contrast  Result Date: 2020-02-08 CLINICAL DATA:  64 year old male with cardiac arrest and encephalopathy. EXAM: CT HEAD WITHOUT CONTRAST CT CERVICAL SPINE WITHOUT CONTRAST TECHNIQUE: Multidetector CT imaging of the head and cervical spine was performed following the standard protocol without intravenous contrast. Multiplanar CT image reconstructions of the cervical spine were also generated. COMPARISON:  None. FINDINGS: Evaluation of this exam is limited due to motion artifact. CT HEAD FINDINGS Brain: There is mild age-related atrophy and chronic microvascular ischemic changes. Probable small old left caudate head infarct as well as focal area of old infarct involving the right cerebellum. There is no acute intracranial hemorrhage. No mass effect or midline shift. No extra-axial fluid collection. Vascular: No hyperdense vessel or unexpected calcification. Skull: Normal. Negative for fracture or focal lesion. Sinuses/Orbits: There is diffuse mucoperiosteal thickening of paranasal sinuses. The mastoid air cells are clear. Other: None CT CERVICAL SPINE FINDINGS Alignment: No acute subluxation. There is mild reversal of normal cervical lordosis which may be positional or due to muscle spasm or secondary to degenerative changes. Skull base and vertebrae: No definite acute fracture. Soft tissues and spinal canal: No prevertebral fluid or swelling. No visible canal hematoma. Disc levels: Multilevel degenerative changes with endplate irregularity and disc space narrowing. Upper chest: Left apical subpleural density, likely atelectasis. Other: Partially visualized endotracheal and enteric tube as well as right IJ central venous line. Bilateral carotid bulb calcified plaques. IMPRESSION: 1. No acute intracranial pathology. 2. Age-related atrophy and chronic microvascular ischemic changes. Old right cerebellar infarct. 3. No  acute/traumatic cervical spine pathology. Electronically Signed   By: Elgie Collard M.D.   On: 02/08/20 23:53   MR BRAIN WO CONTRAST  Result Date: 02/06/2020 CLINICAL DATA:  Acute respiratory failure due to NSTEMI an acute COPD exacerbation s/p cardiac arrest. EXAM: MRI HEAD WITHOUT CONTRAST TECHNIQUE: Multiplanar, multiecho pulse sequences of the brain and surrounding structures were obtained without intravenous contrast. COMPARISON:  Head CT 02-08-20 FINDINGS: Brain: Restricted diffusion with increased T2 signal involving diffusely the cerebral and cerebellar cortex as well as deep gray matter and hippocampi consistent with anoxic brain injury. There is associated swelling with decrease in size of the supratentorial ventricles compared to prior CT and medially deviation of the uncus bilaterally. 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 Scattered foci of T2 hyperintensity within the white matter of the cerebral hemispheres, nonspecific. Vascular: Normal flow voids. Skull and upper cervical spine: Normal marrow signal. Sinuses/Orbits: Fluid level within the ethmoid cells, bilateral maxillary and sphenoid sinuses. The orbits are maintained. Other: None. IMPRESSION:  Findings consistent with anoxic brain injury. Decreased size of the supratentorial ventricles compared with prior CT and medial ization of the uncus bilaterally. These results were called by telephone at the time of interpretation on 02/06/2020 at 2:56 pm to provider Erin Fulling , who verbally acknowledged these results. Electronically Signed   By: Baldemar Lenis M.D.   On: 02/06/2020 15:01   DG Chest Port 1 View  Result Date: 02/04/2020 CLINICAL DATA:  Intubation EXAM: PORTABLE CHEST 1 VIEW COMPARISON:  Yesterday FINDINGS: The endotracheal tube tip is halfway between the clavicular heads and carina. The enteric tube reaches the stomach. Right IJ line with tip at the upper SVC. Interstitial coarsening with mildly  improved aeration from before. No effusion or apical pneumothorax. More lucent appearance at the bases is symmetric and attributed to angle of imaging. Normal heart size. IMPRESSION: 1. Unremarkable hardware positioning. 2. Mild improvement in lung volumes. Electronically Signed   By: Marnee Spring M.D.   On: 02/04/2020 04:51   DG Chest Portable 1 View  Result Date: 2020/02/16 CLINICAL DATA:  Central line placement EXAM: PORTABLE CHEST 1 VIEW COMPARISON:  02-16-20 FINDINGS: Right central line tip is in the SVC. No pneumothorax. Endotracheal tube and NG tube are unchanged. Heart is normal size. Increasing bibasilar opacities, likely atelectasis. No effusions. No acute bony abnormality. IMPRESSION: Right central line tip in the SVC.  No pneumothorax. Bibasilar atelectasis. Electronically Signed   By: Charlett Nose M.D.   On: 02/16/2020 21:08   DG Chest Portable 1 View  Result Date: Feb 16, 2020 CLINICAL DATA:  Cardiac arrest EXAM: PORTABLE CHEST 1 VIEW COMPARISON:  05/15/2019 FINDINGS: Endotracheal to is 2.5 cm above the carina. NG tube enters the stomach. Heart is normal size. No confluent opacities or effusions. No pneumothorax. No acute bony abnormality. IMPRESSION: No acute cardiopulmonary disease. Electronically Signed   By: Charlett Nose M.D.   On: 2020-02-16 19:46   ECHOCARDIOGRAM COMPLETE  Result Date: 02/04/2020    ECHOCARDIOGRAM REPORT   Patient Name:   DARROL BRANDENBURG Rogerson Date of Exam: 02/04/2020 Medical Rec #:  [id number]   Height:       72.0 in Accession #:    [id number]  Weight:       186.9 lb Date of Birth:  Feb 24, 1956   BSA:          2.070 m Patient Age:    64 years    BP:           96/61 mmHg Patient Gender: M           HR:           82 bpm. Exam Location:  [facility] Procedure: 2D Echo and Intracardiac Opacification Agent Indications:     Cardiac Arrest  History:         Patient has prior history of Echocardiogram examinations. CHF,                  COPD; Risk Factors:Hypertension, Diabetes and Dyslipidemia.   Sonographer:     Enrique Sack Thornton-Maynard Referring Phys:  [id number] Alphonzo Lemmings F DAVIS Diagnosing Phys: Yvonne Kendall MD  Sonographer Comments: Image acquisition challenging due to COPD. IMPRESSIONS  1. Left ventricular ejection fraction, by estimation, is 35 to 40%. The left ventricle has moderately decreased function. Left ventricular endocardial border not optimally defined to evaluate regional wall motion. There is mild asymmetric left ventricular hypertrophy of the basal-septal segment. Left ventricular diastolic parameters are consistent with Grade I diastolic dysfunction (impaired relaxation).  Elevated left atrial pressure.  2. Right ventricular systolic function is mildly reduced. The right ventricular size is normal. Tricuspid regurgitation signal is inadequate for assessing PA pressure.  3. The mitral valve was not well visualized. Mild mitral valve regurgitation. No evidence of mitral stenosis.  4. The aortic valve was not well visualized. Aortic valve regurgitation is mild to moderate. No aortic stenosis is present.  5. Pulmonic valve regurgitation not well assessed.  6. The inferior vena cava is normal in size with greater than 50% respiratory variability, suggesting right atrial pressure of 3 mmHg. FINDINGS  Left Ventricle: Left ventricular ejection fraction, by estimation, is 35 to 40%. The left ventricle has moderately decreased function. Left ventricular endocardial border not optimally defined to evaluate regional wall motion. Definity contrast agent was given IV to delineate the left ventricular endocardial borders. The left ventricular internal cavity size was normal in size. There is mild asymmetric left ventricular hypertrophy of the basal-septal segment. Abnormal (paradoxical) septal motion, consistent with left bundle branch block. Left ventricular diastolic parameters are consistent with Grade I diastolic dysfunction (impaired relaxation). Elevated left atrial pressure.  LV Wall Scoring: The mid  and distal anterior wall, mid inferoseptal segment, and apical septal segment are akinetic. The entire lateral wall, anterior septum, entire inferior wall, basal anterior segment, and basal inferoseptal segment are hypokinetic. Right Ventricle: The right ventricular size is normal. No increase in right ventricular wall thickness. Right ventricular systolic function is mildly reduced. Tricuspid regurgitation signal is inadequate for assessing PA pressure. Left Atrium: Left atrial size was normal in size. Right Atrium: Right atrial size was not well visualized. Pericardium: There is no evidence of pericardial effusion. Mitral Valve: The mitral valve was not well visualized. Mild mitral annular calcification. Mild mitral valve regurgitation. No evidence of mitral valve stenosis. MV peak gradient, 4.8 mmHg. The mean mitral valve gradient is 2.0 mmHg. Tricuspid Valve: The tricuspid valve is not well visualized. Tricuspid valve regurgitation is trivial. Aortic Valve: The aortic valve was not well visualized. Aortic valve regurgitation is mild to moderate. Aortic regurgitation PHT measures 470 msec. No aortic stenosis is present. Aortic valve mean gradient measures 7.0 mmHg. Aortic valve peak gradient measures 11.3 mmHg. Aortic valve area, by VTI measures 1.40 cm. Pulmonic Valve: The pulmonic valve was not well visualized. Pulmonic valve regurgitation not well assessed. No evidence of pulmonic stenosis. Aorta: The aortic root is normal in size and structure. Pulmonary Artery: The pulmonary artery is not well seen. Venous: The inferior vena cava is normal in size with greater than 50% respiratory variability, suggesting right atrial pressure of 3 mmHg. IAS/Shunts: The interatrial septum was not well visualized.  LEFT VENTRICLE PLAX 2D LVIDd:         4.89 cm      Diastology LVIDs:         4.16 cm      LV e' lateral:   5.59 cm/s LV PW:         0.95 cm      LV E/e' lateral: 12.3 LV IVS:        1.32 cm      LV e' medial:     4.05 cm/s LVOT diam:     1.80 cm      LV E/e' medial:  17.0 LV SV:         41 LV SV Index:   20 LVOT Area:     2.54 cm  LV Volumes (MOD) LV vol d, MOD A2C: 134.0 ml  LV vol d, MOD A4C: 56.5 ml LV vol s, MOD A2C: 78.5 ml LV vol s, MOD A4C: 48.3 ml LV SV MOD A2C:     55.5 ml LV SV MOD A4C:     56.5 ml LV SV MOD BP:      43.0 ml RIGHT VENTRICLE RV S prime:     9.16 cm/s TAPSE (M-mode): 1.9 cm LEFT ATRIUM             Index LA Vol (A2C):   45.4 ml 21.93 ml/m LA Vol (A4C):   48.1 ml 23.23 ml/m LA Biplane Vol: 47.3 ml 22.85 ml/m  AORTIC VALVE AV Area (Vmax):    1.54 cm AV Area (Vmean):   1.47 cm AV Area (VTI):     1.40 cm AV Vmax:           168.00 cm/s AV Vmean:          120.500 cm/s AV VTI:            0.294 m AV Peak Grad:      11.3 mmHg AV Mean Grad:      7.0 mmHg LVOT Vmax:         102.00 cm/s LVOT Vmean:        69.800 cm/s LVOT VTI:          0.162 m LVOT/AV VTI ratio: 0.55 AI PHT:            470 msec MITRAL VALVE MV Peak grad: 4.8 mmHg     SHUNTS MV Mean grad: 2.0 mmHg     Systemic VTI:  0.16 m MV Vmax:      1.10 m/s     Systemic Diam: 1.80 cm MV Vmean:     69.6 cm/s MV E velocity: 68.90 cm/s MV A velocity: 97.50 cm/s MV E/A ratio:  0.71 Cristal Deer End MD Electronically signed by Yvonne Kendall MD Signature Date/Time: 02/04/2020/2:31:26 PM    Final     Microbiology Recent Results (from the past 240 hour(s))  Respiratory Panel by RT PCR (Flu A&B, Covid) - Nasopharyngeal Swab     Status: None   Collection Time: 02/20/2020  7:20 PM   Specimen: Nasopharyngeal Swab  Result Value Ref Range Status   SARS Coronavirus 2 by RT PCR NEGATIVE NEGATIVE Final    Comment: (NOTE) SARS-CoV-2 target nucleic acids are NOT DETECTED. The SARS-CoV-2 RNA is generally detectable in upper respiratoy specimens during the acute phase of infection. The lowest concentration of SARS-CoV-2 viral copies this assay can detect is 131 copies/mL. A negative result does not preclude SARS-Cov-2 infection and should not be used as the  sole basis for treatment or other patient management decisions. A negative result may occur with  improper specimen collection/handling, submission of specimen other than nasopharyngeal swab, presence of viral mutation(s) within the areas targeted by this assay, and inadequate number of viral copies (<131 copies/mL). A negative result must be combined with clinical observations, patient history, and epidemiological information. The expected result is Negative. Fact Sheet for Patients:  https://www.moore.com/ Fact Sheet for Healthcare Providers:  https://www.young.biz/ This test is not yet ap proved or cleared by the [place] FDA and  has been authorized for detection and/or diagnosis of SARS-CoV-2 by FDA under an Emergency Use Authorization (EUA). This EUA will remain  in effect (meaning this test can be used) for the duration of the COVID-19 declaration under Section 564(b)(1) of the Act, 21 U.S.C. section 360bbb-3(b)(1), unless the authorization is terminated or revoked sooner.  Influenza A by PCR NEGATIVE NEGATIVE Final   Influenza B by PCR NEGATIVE NEGATIVE Final    Comment: (NOTE) The Xpert Xpress SARS-CoV-2/FLU/RSV assay is intended as an aid in  the diagnosis of influenza from Nasopharyngeal swab specimens and  should not be used as a sole basis for treatment. Nasal washings and  aspirates are unacceptable for Xpert Xpress SARS-CoV-2/FLU/RSV  testing. Fact Sheet for Patients: https://www.moore.com/ Fact Sheet for Healthcare Providers: https://www.young.biz/ This test is not yet approved or cleared by the [place] FDA and  has been authorized for detection and/or diagnosis of SARS-CoV-2 by  FDA under an Emergency Use Authorization (EUA). This EUA will remain  in effect (meaning this test can be used) for the duration of the  Covid-19 declaration under Section 564(b)(1) of the Act, 21   U.S.C. section 360bbb-3(b)(1), unless the authorization is  terminated or revoked. Performed at [facility], [address] [place], [place] [address]   Urine Culture     Status: None   Collection Time: 02/08/2020  7:20 PM   Specimen: Urine, Random  Result Value Ref Range Status   Specimen Description   Final    URINE, RANDOM Performed at [facility], [address], [place], [place] [address]    Special Requests   Final    NONE Performed at [facility], [address], [place], [place] [address]    Culture   Final    NO GROWTH Performed at [facility], [address], [place], [place] [address]    Report Status 02/05/2020 FINAL  Final  Blood culture (routine x 2)     Status: None (Preliminary result)   Collection Time: Feb 08, 2020  9:00 PM   Specimen: BLOOD  Result Value Ref Range Status   Specimen Description BLOOD BLOOD RIGHT FOREARM  Final   Special Requests   Final    BOTTLES DRAWN AEROBIC AND ANAEROBIC Blood Culture results may not be optimal due to an inadequate volume of blood received in culture bottles   Culture   Final    NO GROWTH 3 DAYS Performed at [facility], [address] [place], [place] [address]    Report Status PENDING  Incomplete  Blood culture (routine x 2)     Status: None (Preliminary result)   Collection Time: Feb 08, 2020  9:01 PM   Specimen: BLOOD  Result Value Ref Range Status   Specimen Description BLOOD BLOOD LEFT HAND  Final   Special Requests   Final    BOTTLES DRAWN AEROBIC AND ANAEROBIC Blood Culture results may not be optimal due to an inadequate volume of blood received in culture bottles   Culture   Final    NO GROWTH 3 DAYS Performed at [facility], [address], [place], [place] [address]    Report Status PENDING  Incomplete  MRSA PCR Screening     Status: None   Collection Time: 02/04/20 12:02 AM   Specimen: Nasopharyngeal  Result Value Ref Range Status   MRSA by PCR  NEGATIVE NEGATIVE Final    Comment:        The GeneXpert MRSA Assay (FDA approved for NASAL specimens only), is one component of a comprehensive MRSA colonization surveillance program. It is not intended to diagnose MRSA infection nor to guide or monitor treatment for MRSA infections. Performed at [facility], [address], [place], [place] [address]   Culture, respiratory     Status: None   Collection Time:  02/04/20 11:00 AM   Specimen: Tracheal Aspirate  Result Value Ref Range Status   Specimen Description   Final    TRACHEAL ASPIRATE Performed at [facility], [address] [place], [place] [address]    Special Requests   Final    NONE Performed at [facility], [address] [place], [place] [address]    Gram Stain   Final    ABUNDANT WBC PRESENT,BOTH PMN AND MONONUCLEAR NO ORGANISMS SEEN Performed at [facility], [place] [address] [place], [place] [address]    Culture RARE CANDIDA ALBICANS  Final   Report Status 02-24-2020 FINAL  Final  Culture, respiratory (non-expectorated)     Status: None (Preliminary result)   Collection Time: 02/06/20 11:50 PM   Specimen: Tracheal Aspirate; Respiratory  Result Value Ref Range Status   Specimen Description   Final    TRACHEAL ASPIRATE Performed at [facility], [address] [place], [place] [address]    Special Requests   Final    NONE Performed at [facility], [address] [place], [place] [address]    Gram Stain   Final    RARE WBC PRESENT, PREDOMINANTLY PMN RARE YEAST Performed at [facility], [place] [address] [place], [place] [address]    Culture PENDING  Incomplete   Report Status PENDING  Incomplete  Respiratory Panel by RT PCR (Flu A&B, Covid) - Nasopharyngeal Swab     Status: None   Collection Time: 24-Feb-2020 12:14 AM   Specimen: Nasopharyngeal Swab  Result Value Ref Range Status   SARS Coronavirus 2 by RT PCR NEGATIVE NEGATIVE Final     Comment: (NOTE) SARS-CoV-2 target nucleic acids are NOT DETECTED. The SARS-CoV-2 RNA is generally detectable in upper respiratoy specimens during the acute phase of infection. The lowest concentration of SARS-CoV-2 viral copies this assay can detect is 131 copies/mL. A negative result does not preclude SARS-Cov-2 infection and should not be used as the sole basis for treatment or other patient management decisions. A negative result may occur with  improper specimen collection/handling, submission of specimen other than nasopharyngeal swab, presence of viral mutation(s) within the areas targeted by this assay, and inadequate number of viral copies (<131 copies/mL). A negative result must be combined with clinical observations, patient history, and epidemiological information. The expected result is Negative. Fact Sheet for Patients:  PinkCheek.be Fact Sheet for Healthcare Providers:  GravelBags.it This test is not yet ap proved or cleared by the [place] FDA and  has been authorized for detection and/or diagnosis of SARS-CoV-2 by FDA under an Emergency Use Authorization (EUA). This EUA will remain  in effect (meaning this test can be used) for the duration of the COVID-19 declaration under Section 564(b)(1) of the Act, 21 U.S.C. section 360bbb-3(b)(1), unless the authorization is terminated or revoked sooner.    Influenza A by PCR NEGATIVE NEGATIVE Final   Influenza B by PCR NEGATIVE NEGATIVE Final    Comment: (NOTE) The Xpert Xpress SARS-CoV-2/FLU/RSV assay is intended as an aid in  the diagnosis of influenza from Nasopharyngeal swab specimens and  should not be used as a sole basis for treatment. Nasal washings and  aspirates are unacceptable for Xpert Xpress SARS-CoV-2/FLU/RSV  testing. Fact Sheet for Patients: PinkCheek.be Fact Sheet for Healthcare  Providers: GravelBags.it This test is not yet approved or cleared by the [place] FDA and  has been authorized for detection and/or diagnosis of SARS-CoV-2 by  FDA under an Emergency  Use Authorization (EUA). This EUA will remain  in effect (meaning this test can be used) for the duration of the  Covid-19 declaration under Section 564(b)(1) of the Act, 21  U.S.C. section 360bbb-3(b)(1), unless the authorization is  terminated or revoked. Performed at [facility], [address] [place], [place] [id number]     Lab Basic Metabolic Panel: Recent Labs  Lab 02/09/2020 1919 02/04/20 0037 02/04/20 0601 02/05/20 0738 02/05/20 0837 02/06/20 0520  NA 125* 130* 133* 131*  --   --   K 5.5* 4.8 4.3 4.2  --   --   CL 90* 95* 101 96*  --   --   CO2 19* 26 24 24   --   --   GLUCOSE 366* 201* 124* 156*  --   --   BUN 9 11 14 17   --   --   CREATININE 1.50* 1.26* 1.27* 1.19  --   --   CALCIUM 8.2* 6.7* 6.9* 7.1*  --   --   MG 2.4 1.8 1.9  --  2.3 2.5*  PHOS  --  5.7*  --   --  4.2 3.2   Liver Function Tests: Recent Labs  Lab 02/13/2020 1919  AST 93*  ALT 64*  ALKPHOS 69  BILITOT 0.7  PROT 5.8*  ALBUMIN 3.3*   No results for input(s): LIPASE, AMYLASE in the last 168 hours. No results for input(s): AMMONIA in the last 168 hours. CBC: Recent Labs  Lab 02/01/2020 1919 02/13/2020 1919 02/04/20 0037 02/04/20 0037 02/04/20 0801 02/04/20 1251 02/05/20 0130 02/05/20 0738 02/06/20 0520  WBC 8.1  --  13.6*  --   --   --   --   --  15.8*  NEUTROABS 2.2  --   --   --   --   --   --   --   --   HGB 13.8   < > 14.8   < > 14.6 14.2 13.5 13.5 12.4*  HCT 41.4   < > 42.9   < > 44.0 40.7 39.1 39.1 36.4*  MCV 99.3  --  96.4  --   --   --   --   --  98.9  PLT 196  --  266  --   --   --   --   --  200   < > = values in this interval not displayed.   Cardiac Enzymes: No results for input(s): CKTOTAL, CKMB, CKMBINDEX, TROPONINI in the last 168 hours. Sepsis  Labs: Recent Labs  Lab 02/17/2020 1919 02/19/2020 1951 02/11/2020 2101 02/04/20 0037 02/04/20 0601 02/04/20 1251 02/05/20 0837 02/05/20 1029 02/06/20 0520  PROCALCITON  --   --   --   --   --  7.87  --   --   --   WBC 8.1  --   --  13.6*  --   --   --   --  15.8*  LATICACIDVEN  --    < > 5.7*  --  2.1*  --  3.0* 3.6*  --    < > = values in this interval not displayed.    Procedures/Operations  Endotracheal intubation 4/5 Right IJ CVC 4/5     Harlon DittyJeremiah Cristela Stalder, AGACNP-BC [facility] Pager: [phone]  Erin FullingKurian Kasa 02/28/2020, 5:43 PM

## 2020-02-29 NOTE — Progress Notes (Signed)
Pt's family decided yesterday to make pt comfort care, however was continuing life sustaining measures as Pt was to be potential organ donor for kidneys and liver.  However upon workup as requested by CDS, CT Chest found to have spiculated nodule in Right Lower lobe concerning for bronchogenic carcinoma.  Given concern for possible lung cancer, CDS has determined pt is no longer candidate for organ donation.  CDS spoke with pt's family regarding potential lung cancer and that pt is no longer candidate for organ donation.  Plan will be to transition to comfort measures later this morning when family arrives as bedside.    Harlon Ditty, AGACNP-BC [facility] Pager: [phone]

## 2020-02-29 NOTE — Progress Notes (Signed)
CRITICAL CARE NOTE Acute respiratory failure due to NSTEMI and acute COPD exacerbation s/p cardiac arrest with multiorgan failure with acute systolic CHF exacerbation  SIGNIFICANT EVENTS 4/5 admitted for cardiac arrest 4/6 severe resp failure 4/7 remains critically ill, multiorgan failure 4/8 patient is DNR/DNI, plan for comfort measures today  CC  follow up respiratory failure  SUBJECTIVE Patient remains critically ill Prognosis is guarded Severe brain damage  BP (!) 143/68   Pulse 85   Temp 100.2 F (37.9 C) (Oral)   Resp (!) 22   Ht 6' 0.01" (1.829 m)   Wt 80.6 kg   SpO2 93%   BMI 24.09 kg/m    I/O last 3 completed shifts: In: 1839.7 [I.V.:1079.3; IV Piggyback:760.4] Out: 1200 [Urine:1150; Emesis/NG output:50] No intake/output data recorded.  SpO2: 93 % FiO2 (%): 28 %  Estimated body mass index is 24.09 kg/m as calculated from the following:   Height as of this encounter: 6' 0.01" (1.829 m).   Weight as of this encounter: 80.6 kg.  SIGNIFICANT EVENTS   REVIEW OF SYSTEMS  PATIENT IS UNABLE TO PROVIDE COMPLETE REVIEW OF SYSTEMS DUE TO SEVERE CRITICAL ILLNESS        PHYSICAL EXAMINATION:  GENERAL:critically ill appearing, +resp distress HEAD: Normocephalic, atraumatic.  EYES: Pupils equal, round, reactive to light.  No scleral icterus.  MOUTH: Moist mucosal membrane. NECK: Supple.  PULMONARY: +rhonchi, +wheezing CARDIOVASCULAR: S1 and S2. Regular rate and rhythm. No murmurs, rubs, or gallops.  GASTROINTESTINAL: Soft, nontender, -distended.  Positive bowel sounds.   MUSCULOSKELETAL: No swelling, clubbing, or edema.  NEUROLOGIC: obtunded, GCS<3 SKIN:intact,warm,dry  MEDICATIONS: I have reviewed all medications and confirmed regimen as documented   CULTURE RESULTS   Recent Results (from the past 240 hour(s))  Respiratory Panel by RT PCR (Flu A&B, Covid) - Nasopharyngeal Swab     Status: None   Collection Time: 2020/03/04  7:20 PM   Specimen:  Nasopharyngeal Swab  Result Value Ref Range Status   SARS Coronavirus 2 by RT PCR NEGATIVE NEGATIVE Final    Comment: (NOTE) SARS-CoV-2 target nucleic acids are NOT DETECTED. The SARS-CoV-2 RNA is generally detectable in upper respiratoy specimens during the acute phase of infection. The lowest concentration of SARS-CoV-2 viral copies this assay can detect is 131 copies/mL. A negative result does not preclude SARS-Cov-2 infection and should not be used as the sole basis for treatment or other patient management decisions. A negative result may occur with  improper specimen collection/handling, submission of specimen other than nasopharyngeal swab, presence of viral mutation(s) within the areas targeted by this assay, and inadequate number of viral copies (<131 copies/mL). A negative result must be combined with clinical observations, patient history, and epidemiological information. The expected result is Negative. Fact Sheet for Patients:  https://www.moore.com/ Fact Sheet for Healthcare Providers:  https://www.young.biz/ This test is not yet ap proved or cleared by the [place] FDA and  has been authorized for detection and/or diagnosis of SARS-CoV-2 by FDA under an Emergency Use Authorization (EUA). This EUA will remain  in effect (meaning this test can be used) for the duration of the COVID-19 declaration under Section 564(b)(1) of the Act, 21 U.S.C. section 360bbb-3(b)(1), unless the authorization is terminated or revoked sooner.    Influenza A by PCR NEGATIVE NEGATIVE Final   Influenza B by PCR NEGATIVE NEGATIVE Final    Comment: (NOTE) The Xpert Xpress SARS-CoV-2/FLU/RSV assay is intended as an aid in  the diagnosis of influenza from Nasopharyngeal swab specimens and  should  not be used as a sole basis for treatment. Nasal washings and  aspirates are unacceptable for Xpert Xpress SARS-CoV-2/FLU/RSV  testing. Fact Sheet for  Patients: https://www.moore.com/ Fact Sheet for Healthcare Providers: https://www.young.biz/ This test is not yet approved or cleared by the [place] FDA and  has been authorized for detection and/or diagnosis of SARS-CoV-2 by  FDA under an Emergency Use Authorization (EUA). This EUA will remain  in effect (meaning this test can be used) for the duration of the  Covid-19 declaration under Section 564(b)(1) of the Act, 21  U.S.C. section 360bbb-3(b)(1), unless the authorization is  terminated or revoked. Performed at [facility], [address], [place], [place] [address]   Urine Culture     Status: None   Collection Time: 02-28-2020  7:20 PM   Specimen: Urine, Random  Result Value Ref Range Status   Specimen Description   Final    URINE, RANDOM Performed at [facility], [address] [place], [place] [address]    Special Requests   Final    NONE Performed at [facility], [address] [place], [place] [address]    Culture   Final    NO GROWTH Performed at [facility], [address], [place], [place] [address]    Report Status 02/05/2020 FINAL  Final  Blood culture (routine x 2)     Status: None (Preliminary result)   Collection Time: 02-28-2020  9:00 PM   Specimen: BLOOD  Result Value Ref Range Status   Specimen Description BLOOD BLOOD RIGHT FOREARM  Final   Special Requests   Final    BOTTLES DRAWN AEROBIC AND ANAEROBIC Blood Culture results may not be optimal due to an inadequate volume of blood received in culture bottles   Culture   Final    NO GROWTH 3 DAYS Performed at [facility], [address] [place], [place] [address]    Report Status PENDING  Incomplete  Blood culture (routine x 2)     Status: None (Preliminary result)   Collection Time: February 28, 2020  9:01 PM   Specimen: BLOOD  Result Value Ref Range Status   Specimen Description BLOOD BLOOD LEFT HAND  Final    Special Requests   Final    BOTTLES DRAWN AEROBIC AND ANAEROBIC Blood Culture results may not be optimal due to an inadequate volume of blood received in culture bottles   Culture   Final    NO GROWTH 3 DAYS Performed at [facility], [address] [place], [place] [address]    Report Status PENDING  Incomplete  MRSA PCR Screening     Status: None   Collection Time: 02/04/20 12:02 AM   Specimen: Nasopharyngeal  Result Value Ref Range Status   MRSA by PCR NEGATIVE NEGATIVE Final    Comment:        The GeneXpert MRSA Assay (FDA approved for NASAL specimens only), is one component of a comprehensive MRSA colonization surveillance program. It is not intended to diagnose MRSA infection nor to guide or monitor treatment for MRSA infections. Performed at [facility], [address] [place], [place] [address]   Culture, respiratory     Status: None (Preliminary result)   Collection Time: 02/04/20 11:00 AM   Specimen: Tracheal Aspirate  Result Value Ref Range Status   Specimen Description   Final    TRACHEAL ASPIRATE Performed at [facility], [address] [place], [place] [address]    Special  Requests   Final    NONE Performed at [facility], [address] [place], [place] [address]    Gram Stain   Final    ABUNDANT WBC PRESENT,BOTH PMN AND MONONUCLEAR NO ORGANISMS SEEN    Culture   Final    RARE YEAST CULTURE REINCUBATED FOR BETTER GROWTH Performed at [facility], [address] [place], [place] [address]    Report Status PENDING  Incomplete  Respiratory Panel by RT PCR (Flu A&B, Covid) - Nasopharyngeal Swab     Status: None   Collection Time: 02/09/2020 12:14 AM   Specimen: Nasopharyngeal Swab  Result Value Ref Range Status   SARS Coronavirus 2 by RT PCR NEGATIVE NEGATIVE Final    Comment: (NOTE) SARS-CoV-2 target nucleic acids are NOT DETECTED. The SARS-CoV-2 RNA is generally detectable in upper  respiratoy specimens during the acute phase of infection. The lowest concentration of SARS-CoV-2 viral copies this assay can detect is 131 copies/mL. A negative result does not preclude SARS-Cov-2 infection and should not be used as the sole basis for treatment or other patient management decisions. A negative result may occur with  improper specimen collection/handling, submission of specimen other than nasopharyngeal swab, presence of viral mutation(s) within the areas targeted by this assay, and inadequate number of viral copies (<131 copies/mL). A negative result must be combined with clinical observations, patient history, and epidemiological information. The expected result is Negative. Fact Sheet for Patients:  https://www.moore.com/https://www.fda.gov/media/142436/download Fact Sheet for Healthcare Providers:  https://www.young.biz/https://www.fda.gov/media/142435/download This test is not yet ap proved or cleared by the [place] FDA and  has been authorized for detection and/or diagnosis of SARS-CoV-2 by FDA under an Emergency Use Authorization (EUA). This EUA will remain  in effect (meaning this test can be used) for the duration of the COVID-19 declaration under Section 564(b)(1) of the Act, 21 U.S.C. section 360bbb-3(b)(1), unless the authorization is terminated or revoked sooner.    Influenza A by PCR NEGATIVE NEGATIVE Final   Influenza B by PCR NEGATIVE NEGATIVE Final    Comment: (NOTE) The Xpert Xpress SARS-CoV-2/FLU/RSV assay is intended as an aid in  the diagnosis of influenza from Nasopharyngeal swab specimens and  should not be used as a sole basis for treatment. Nasal washings and  aspirates are unacceptable for Xpert Xpress SARS-CoV-2/FLU/RSV  testing. Fact Sheet for Patients: https://www.moore.com/https://www.fda.gov/media/142436/download Fact Sheet for Healthcare Providers: https://www.young.biz/https://www.fda.gov/media/142435/download This test is not yet approved or cleared by the [place] FDA and  has been authorized for  detection and/or diagnosis of SARS-CoV-2 by  FDA under an Emergency Use Authorization (EUA). This EUA will remain  in effect (meaning this test can be used) for the duration of the  Covid-19 declaration under Section 564(b)(1) of the Act, 21  U.S.C. section 360bbb-3(b)(1), unless the authorization is  terminated or revoked. Performed at [facility], [address] [place], [place] [address]           IMAGING    CT ABDOMEN PELVIS WO CONTRAST  Result Date: 02/10/2020 CLINICAL DATA:  Status post cardiac arrest. Organ donation. EXAM: CT CHEST, ABDOMEN AND PELVIS WITHOUT CONTRAST TECHNIQUE: Multidetector CT imaging of the chest, abdomen and pelvis was performed following the standard protocol without IV contrast. COMPARISON:  None. FINDINGS: CT CHEST FINDINGS Cardiovascular: Heart size appears to be normal in size. Coronary artery calcifications are noted. Mild atherosclerotic changes are noted of the thoracic aorta without evidence for an aneurysm. There is no significant pericardial effusion. There is a  right-sided central venous catheter in place with tip terminating in the distal SVC. Mediastinum/Nodes: --No mediastinal or hilar lymphadenopathy. --No axillary lymphadenopathy. --No supraclavicular lymphadenopathy. --Normal thyroid gland. --The esophagus is unremarkable Lungs/Pleura: Emphysematous changes are noted bilaterally. There is a 4 mm pulmonary nodule in the right upper lobe (axial series 4, image 44). There is a spiculated pulmonary nodule in the superior segment of the right lower lobe (axial series 4, image 63). There is a sub solid 8 mm pulmonary nodule in the peripheral right upper lobe (axial series 4, image 59). Additional scattered bilateral small pulmonary nodules are noted. There is no pneumothorax. There is bronchial wall thickening and mucus plugging involving the lower airways bilaterally. The endotracheal tube terminates above the carina. There are small bilateral  pleural effusions, right greater than left. There is atelectasis at the right lung base. Musculoskeletal: There are bilateral nondisplaced anterior buckle fractures of multiple ribs consistent with the patient's history of cardiopulmonary arrest status post CPR. CT ABDOMEN PELVIS FINDINGS Hepatobiliary: The liver is normal. There is diffuse gallbladder wall thickening.There is no biliary ductal dilation. Pancreas: Normal contours without ductal dilatation. No peripancreatic fluid collection. Spleen: Unremarkable. Adrenals/Urinary Tract: --Adrenal glands: Unremarkable. --Right kidney/ureter: No hydronephrosis or radiopaque kidney stones. --Left kidney/ureter: No hydronephrosis or radiopaque kidney stones. --Urinary bladder: The urinary bladder is decompressed with a Foley catheter. Stomach/Bowel: --Stomach/Duodenum: No hiatal hernia or other gastric abnormality. Normal duodenal course and caliber. --Small bowel: Unremarkable. --Colon: Rectosigmoid diverticulosis without acute inflammation. --Appendix: Normal. Vascular/Lymphatic: Atherosclerotic calcification is present within the non-aneurysmal abdominal aorta, without hemodynamically significant stenosis. --No retroperitoneal lymphadenopathy. --No mesenteric lymphadenopathy. --No pelvic or inguinal lymphadenopathy. Reproductive: Unremarkable Other: There is a trace amount of free fluid in the abdomen. No free air. The abdominal wall is normal. Musculoskeletal. No acute displaced fractures. IMPRESSION: 1. Evaluation is limited by lack of IV contrast and streak artifact through the patient's chest, abdomen, and pelvis. 2. Bilateral nondisplaced anterior buckle fractures of multiple ribs consistent with the patient's history of cardiopulmonary arrest status status post CPR. 3. There is a spiculated nodule in the right lower lobe. While this may be post infectious or inflammatory, is concerning for bronchogenic carcinoma. 4. There are additional scattered bilateral  pulmonary nodules which may be post infectious or inflammatory. 5. There is bronchial wall thickening and mucus plugging involving the lower airways bilaterally which can be seen in patients with infectious or inflammatory bronchiolitis. There may be an underlying component of aspiration. 6. Diffuse gallbladder wall thickening which is nonspecific and may be secondary to the patient's volume status or heart failure. Underlying acute cholecystitis is not excluded. Consider further evaluation with ultrasound as clinically indicated. 7. Rectosigmoid diverticulosis without acute inflammation. 8. Trace amount of free fluid in the abdomen. 9. Small bilateral pleural effusions. 10. Emphysema. Aortic Atherosclerosis (ICD10-I70.0) and Emphysema (ICD10-J43.9). Electronically Signed   By: Katherine Mantle M.D.   On: 02/25/2020 01:28   CT CHEST WO CONTRAST  Result Date: 02/13/2020 CLINICAL DATA:  Status post cardiac arrest. Organ donation. EXAM: CT CHEST, ABDOMEN AND PELVIS WITHOUT CONTRAST TECHNIQUE: Multidetector CT imaging of the chest, abdomen and pelvis was performed following the standard protocol without IV contrast. COMPARISON:  None. FINDINGS: CT CHEST FINDINGS Cardiovascular: Heart size appears to be normal in size. Coronary artery calcifications are noted. Mild atherosclerotic changes are noted of the thoracic aorta without evidence for an aneurysm. There is no significant pericardial effusion. There is a right-sided central venous catheter in place with tip terminating in the  distal SVC. Mediastinum/Nodes: --No mediastinal or hilar lymphadenopathy. --No axillary lymphadenopathy. --No supraclavicular lymphadenopathy. --Normal thyroid gland. --The esophagus is unremarkable Lungs/Pleura: Emphysematous changes are noted bilaterally. There is a 4 mm pulmonary nodule in the right upper lobe (axial series 4, image 44). There is a spiculated pulmonary nodule in the superior segment of the right lower lobe (axial series  4, image 63). There is a sub solid 8 mm pulmonary nodule in the peripheral right upper lobe (axial series 4, image 59). Additional scattered bilateral small pulmonary nodules are noted. There is no pneumothorax. There is bronchial wall thickening and mucus plugging involving the lower airways bilaterally. The endotracheal tube terminates above the carina. There are small bilateral pleural effusions, right greater than left. There is atelectasis at the right lung base. Musculoskeletal: There are bilateral nondisplaced anterior buckle fractures of multiple ribs consistent with the patient's history of cardiopulmonary arrest status post CPR. CT ABDOMEN PELVIS FINDINGS Hepatobiliary: The liver is normal. There is diffuse gallbladder wall thickening.There is no biliary ductal dilation. Pancreas: Normal contours without ductal dilatation. No peripancreatic fluid collection. Spleen: Unremarkable. Adrenals/Urinary Tract: --Adrenal glands: Unremarkable. --Right kidney/ureter: No hydronephrosis or radiopaque kidney stones. --Left kidney/ureter: No hydronephrosis or radiopaque kidney stones. --Urinary bladder: The urinary bladder is decompressed with a Foley catheter. Stomach/Bowel: --Stomach/Duodenum: No hiatal hernia or other gastric abnormality. Normal duodenal course and caliber. --Small bowel: Unremarkable. --Colon: Rectosigmoid diverticulosis without acute inflammation. --Appendix: Normal. Vascular/Lymphatic: Atherosclerotic calcification is present within the non-aneurysmal abdominal aorta, without hemodynamically significant stenosis. --No retroperitoneal lymphadenopathy. --No mesenteric lymphadenopathy. --No pelvic or inguinal lymphadenopathy. Reproductive: Unremarkable Other: There is a trace amount of free fluid in the abdomen. No free air. The abdominal wall is normal. Musculoskeletal. No acute displaced fractures. IMPRESSION: 1. Evaluation is limited by lack of IV contrast and streak artifact through the patient's  chest, abdomen, and pelvis. 2. Bilateral nondisplaced anterior buckle fractures of multiple ribs consistent with the patient's history of cardiopulmonary arrest status status post CPR. 3. There is a spiculated nodule in the right lower lobe. While this may be post infectious or inflammatory, is concerning for bronchogenic carcinoma. 4. There are additional scattered bilateral pulmonary nodules which may be post infectious or inflammatory. 5. There is bronchial wall thickening and mucus plugging involving the lower airways bilaterally which can be seen in patients with infectious or inflammatory bronchiolitis. There may be an underlying component of aspiration. 6. Diffuse gallbladder wall thickening which is nonspecific and may be secondary to the patient's volume status or heart failure. Underlying acute cholecystitis is not excluded. Consider further evaluation with ultrasound as clinically indicated. 7. Rectosigmoid diverticulosis without acute inflammation. 8. Trace amount of free fluid in the abdomen. 9. Small bilateral pleural effusions. 10. Emphysema. Aortic Atherosclerosis (ICD10-I70.0) and Emphysema (ICD10-J43.9). Electronically Signed   By: Katherine Mantle M.D.   On: 02/21/2020 01:28   MR BRAIN WO CONTRAST  Result Date: 02/06/2020 CLINICAL DATA:  Acute respiratory failure due to NSTEMI an acute COPD exacerbation s/p cardiac arrest. EXAM: MRI HEAD WITHOUT CONTRAST TECHNIQUE: Multiplanar, multiecho pulse sequences of the brain and surrounding structures were obtained without intravenous contrast. COMPARISON:  Head CT February 03, 2020 FINDINGS: Brain: Restricted diffusion with increased T2 signal involving diffusely the cerebral and cerebellar cortex as well as deep gray matter and hippocampi consistent with anoxic brain injury. There is associated swelling with decrease in size of the supratentorial ventricles compared to prior CT and medially deviation of the uncus bilaterally. 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 Scattered foci of T2 hyperintensity within the white matter of the cerebral hemispheres, nonspecific. Vascular: Normal flow voids. Skull and upper cervical spine: Normal marrow signal. Sinuses/Orbits: Fluid level within the ethmoid cells, bilateral maxillary and sphenoid sinuses. The orbits are maintained. Other: None. IMPRESSION: Findings consistent with anoxic brain injury. Decreased size of the supratentorial ventricles compared with prior CT and medial ization of the uncus bilaterally. These results were called by telephone at the time of interpretation on 02/06/2020 at 2:56 pm to provider Flora Lipps , who verbally acknowledged these results. Electronically Signed   By: Pedro Earls M.D.   On: 02/06/2020 15:01     Nutrition Status: Nutrition Problem: Inadequate oral intake Etiology: inability to eat(pt sedated and ventilated) Signs/Symptoms: NPO status       Indwelling Urinary Catheter continued, requirement due to   Reason to continue Indwelling Urinary Catheter strict Intake/Output monitoring for hemodynamic instability   Central Line/ continued, requirement due to  Reason to continue [place] of central venous pressure or other hemodynamic parameters and poor IV access   Ventilator continued, requirement due to severe respiratory failure   Ventilator Sedation RASS 0 to -2      ASSESSMENT AND PLAN SYNOPSIS Acute respiratory failure due to NSTEMI and acute COPD exacerbation s/p cardiac arrest with multiorgan failure with acute systolic CHF exacerbation Patient with severe brain damage  Plan for CMO today  ACUTE SYSTOLIC CARDIAC FAILURE- EF 30% -oxygen as needed -Lasix as tolerated +NSTEMI    ACUTE KIDNEY INJURY/Renal Failure -follow chem 7 -follow UO -continue Foley Catheter-assess need -Avoid nephrotoxic agents -Recheck creatinine    NEUROLOGY Severe brain damage   CARDIAC ICU  monitoring  ID -continue IV abx as prescibed -follow up cultures  GI GI PROPHYLAXIS as indicated  NUTRITIONAL STATUS Nutrition Status: Nutrition Problem: Inadequate oral intake Etiology: inability to eat(pt sedated and ventilated) Signs/Symptoms: NPO status     DIET-->TF's as tolerated Constipation protocol as indicated  ENDO - will use ICU hypoglycemic\Hyperglycemia protocol if indicated   ELECTROLYTES -follow labs as needed -replace as needed -pharmacy consultation and following   DVT/GI PRX ordered TRANSFUSIONS AS NEEDED MONITOR FSBS ASSESS the need for LABS as needed   Critical Care Time devoted to patient care services described in this note is 33 minutes.   Overall, patient is critically ill, prognosis is guarded.  Patient with Multiorgan failure and at high risk for cardiac arrest and death.   PLAN FOR COMFORT MEASURES TODAY   Corrin Parker, M.D.  [facility]  Medical Director [place] Director [facility] Cardio-Pulmonary Department

## 2020-02-29 NOTE — Progress Notes (Signed)
Patient is scheduled to be extubated shortly. Family currently at the bedside. Propofol stopped as ordered by Jeri Modena, NP. The plan is to change the vent setting to spontaneous breathing trial to test if the patient has a drive to breath. Vent setting changed to SBT @ 1954. Patient noted to have a drive.Family exited the room while patient being extubated. Patient extubated @ 2039. Morphine 2 mg bolus administered x 2. O2 level currently <50 % on 2L via [place]. Family back at the bedside. Patient expired at 2055 with family at the bedside

## 2020-02-29 NOTE — Progress Notes (Signed)
Patient extubated per Orders. Patient suctioned before and after extubation. Placed on 2L [place]. RN and NP at bedside.

## 2020-02-29 NOTE — Progress Notes (Signed)
Pt transported to CT on vent and returned to ICU without incident. Pt remains on the vent

## 2020-02-29 NOTE — Progress Notes (Signed)
CH conferred w/pt.'s RN earlier in day re: family's plans for one-way extubation @ 7pm.  CH available in ICU @ 7 and awaited family's arrival.  CH met family (CH unsure exactly relationship of five visitors) when they arrived during shift change; family spent time in pt.'s rm. before RT called for extubation.  Just befor procedure, family asked CH to pray for pt. and them.  NP Jeremiah directed extubation be moved back slightly to allow sedatives to leave pt.'s system --> allow for pt.'s drive to breathe.  CH remained nearby rm., available to family, but they appeared to be supporting one another.  Pt. expired shortly after extubation; family helped complete funeral release form.  No further needs expressed.    02/24/2020 1900  Clinical Encounter Type  Visited With Family;Patient not available  Visit Type Initial;Psychological support;Spiritual support;Social support;Patient actively dying;Critical Care  Referral From Nurse  Spiritual Encounters  Spiritual Needs Emotional;Grief support;Prayer  Stress Factors  Family Stress Factors Loss;Loss of control;Major life changes;Health changes   

## 2020-02-29 NOTE — Plan of Care (Signed)
Pt cont on full vent support, fentanyl and dirpovan  gtt going. Pt was ruled out for potential organ donation by CDC this morning. Waiting for pt family to come today and possible resume comfort care as they planned yesterday.

## 2020-02-29 NOTE — Progress Notes (Signed)
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 Pt's family decided yesterday to make pt comfort care, all lab for this morning are on hold based on yesterday discussion with family for comfort care since pt is no longer a candidate for organ donation.

## 2020-02-29 DEATH — deceased

## 2020-07-03 ENCOUNTER — Ambulatory Visit: Payer: Medicaid Other | Admitting: Family

## 2020-08-14 ENCOUNTER — Telehealth: Payer: Self-pay | Admitting: Internal Medicine

## 2020-08-14 NOTE — Telephone Encounter (Signed)
Lm for April with [employer].

## 2020-08-17 NOTE — Telephone Encounter (Signed)
A letter stating that death certificate was signed on 02/13/2020.

## 2020-08-17 NOTE — Telephone Encounter (Signed)
Gotcha. If you can print I will sign today. Thank you

## 2020-08-17 NOTE — Telephone Encounter (Signed)
Letter has been completed and placed in Dr. Clovis Fredrickson folder for signature.

## 2020-08-17 NOTE — Telephone Encounter (Addendum)
Spoke to April with [employer], who is requesting a letter stating that Dr. Belia Heman signed and dated death certificate on 2020-03-07. Number [id number]. Death certificate is missing date.  April would like letter faxed to [phone].  Dr. Belia Heman, please advise. Thanks

## 2020-08-17 NOTE — Telephone Encounter (Signed)
Hi Margie, what do you need me to do?

## 2020-08-20 NOTE — Telephone Encounter (Signed)
Letter has been faxed to provided fax number.  Left message for April with [employer] to make her aware.

## 2020-08-21 NOTE — Telephone Encounter (Signed)
Lm x2 for April. Will close encounter per office protocol.

## 2020-08-21 NOTE — Telephone Encounter (Signed)
Spoke to April with [employer]. April stated that letter was received and nothing further is needed.

## 2022-03-22 IMAGING — MR MR HEAD W/O CM
12 series · 48 of 48 positions shown · non-contrast
Comparison: Head CT February 03, 2020

CLINICAL DATA: Acute respiratory failure due to NSTEMI an acute
COPD exacerbation s/p cardiac arrest.

EXAM:
MRI HEAD WITHOUT CONTRAST
TECHNIQUE: Multiplanar, multiecho pulse sequences of the brain and surrounding
structures were obtained without intravenous contrast.

[Series 9: ax dwi_tracew · axial · 3.0mm · 1.31mm/px · z∈[-71,+80]mm · 3 of 48 slices shown]
[im 1/48]
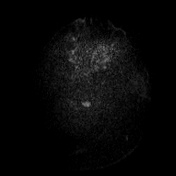
[im 24/48]
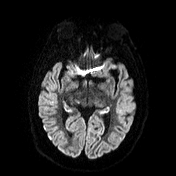
[im 48/48]
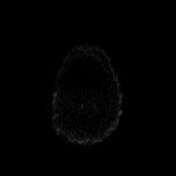

[Series 10: ax dwi_adc · axial · 3.0mm · 1.31mm/px · z∈[-71,+80]mm · 4 of 48 slices shown]
[im 1/48]
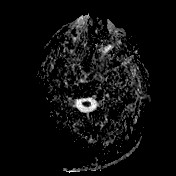
[im 16/48]
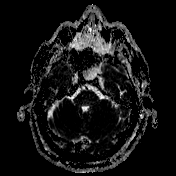
[im 32/48]
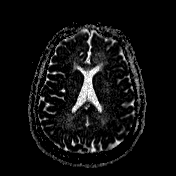
[im 48/48]
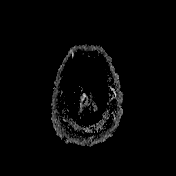

[Series 11: cor dwi_tracew · coronal · 5.0mm · 1.31mm/px · 3 of 36 slices shown]
[im 1/36]
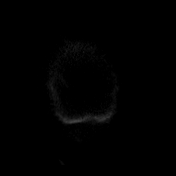
[im 18/36]
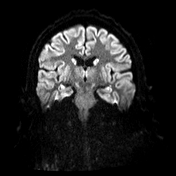
[im 36/36]
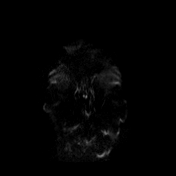

[Series 12: cor dwi_adc · coronal · 5.0mm · 1.31mm/px · 3 of 36 slices shown]
[im 1/36]
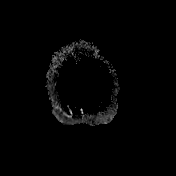
[im 18/36]
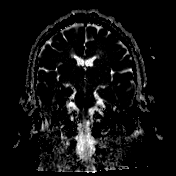
[im 36/36]
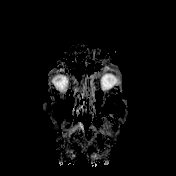

[Series 13: T1 · sagittal · 5.0mm · 0.62mm/px · 2 of 22 slices shown (1 of 2)]
[im 1/22]
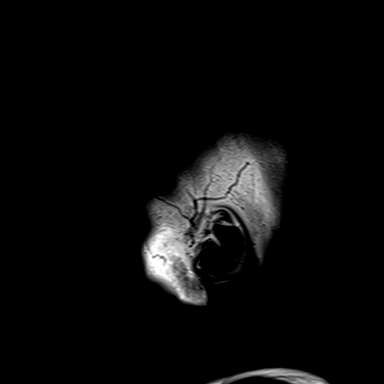
[im 22/22]
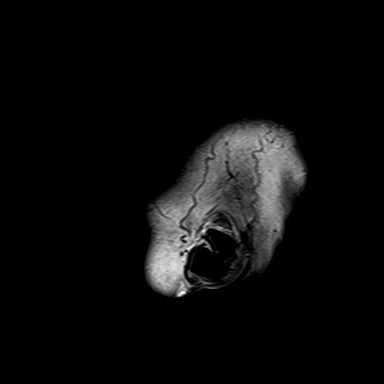

[Series 14: mag_images · axial · 3.0mm · 0.90mm/px · z∈[-76,+73]mm · 4 of 52 slices shown]
[im 1/52]
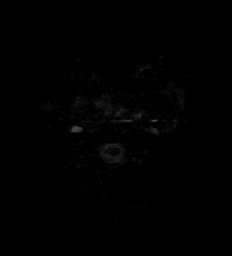
[im 18/52]
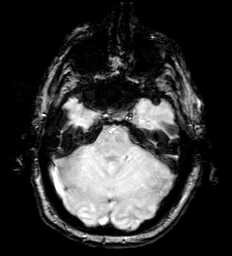
[im 35/52]
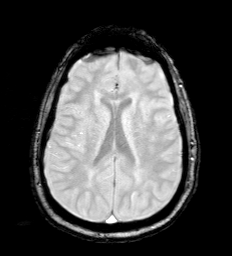
[im 52/52]
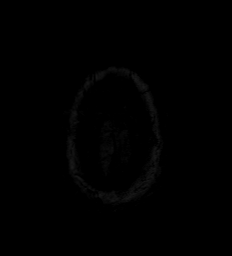

[Series 15: pha_images · axial · 3.0mm · 0.90mm/px · z∈[-76,+73]mm · 4 of 52 slices shown]
[im 1/52]
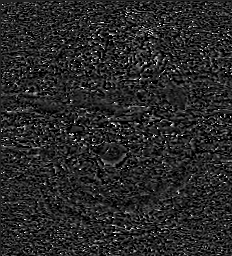
[im 18/52]
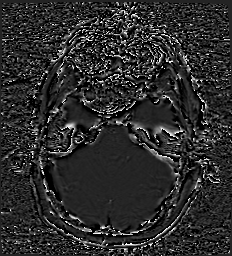
[im 35/52]
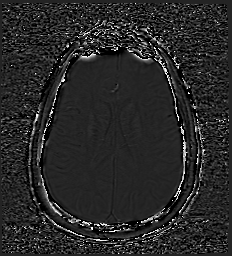
[im 52/52]
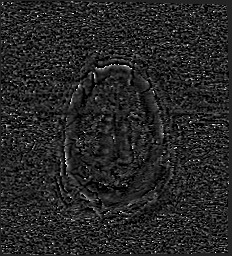

[Series 16: swi_images · axial · 3.0mm · 0.90mm/px · z∈[-76,+73]mm · 4 of 52 slices shown]
[im 1/52]
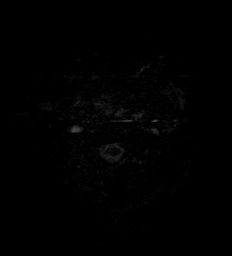
[im 18/52]
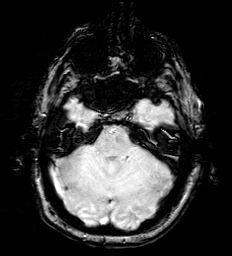
[im 35/52]
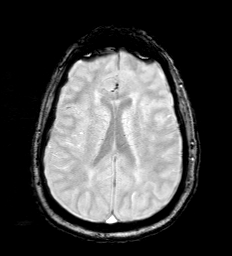
[im 52/52]
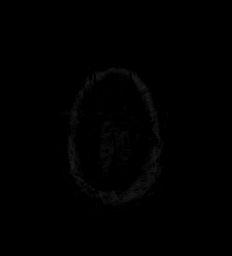

[Series 18: FLAIR · axial · 3.0mm · 0.53mm/px · z∈[-74,+72]mm · 4 of 51 slices shown]
[im 1/51]
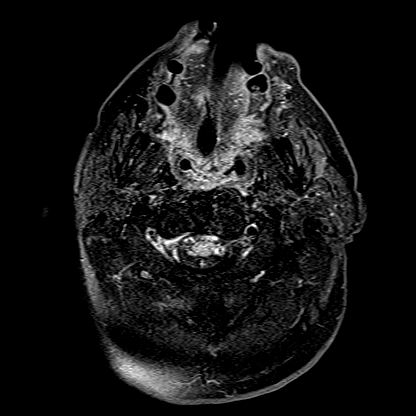
[im 17/51]
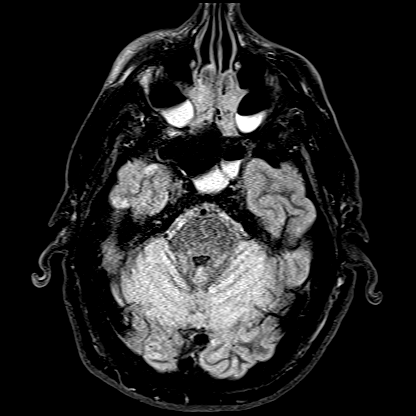
[im 34/51]
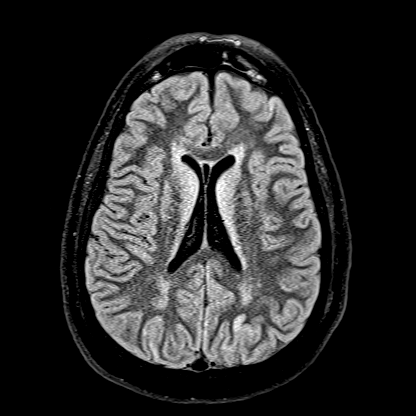
[im 51/51]
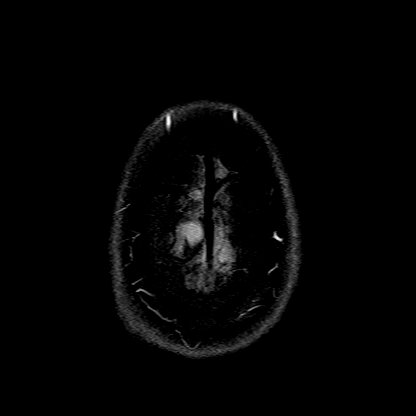

[Series 19: T1 · axial · 1.0mm · 0.98mm/px · z∈[-77,+79]mm · 13 of 160 slices shown (2 of 2)]
[im 1/160]
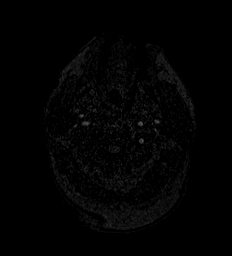
[im 14/160]
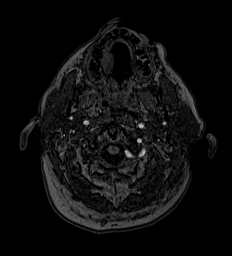
[im 27/160]
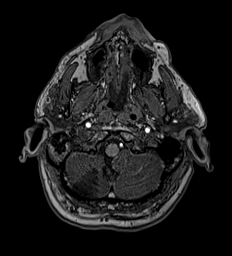
[im 40/160]
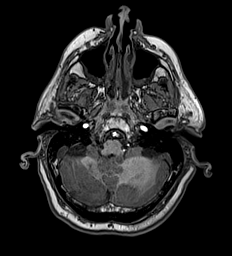
[im 54/160]
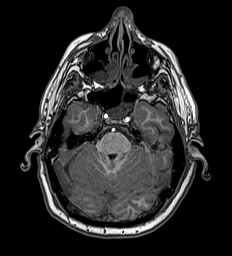
[im 67/160]
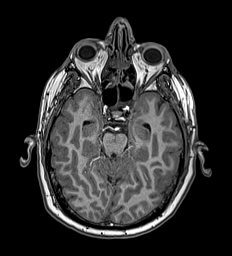
[im 80/160]
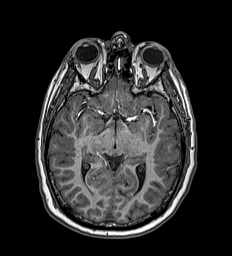
[im 93/160]
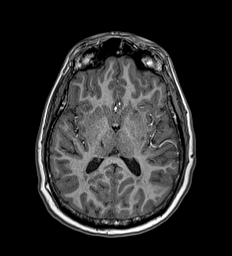
[im 107/160]
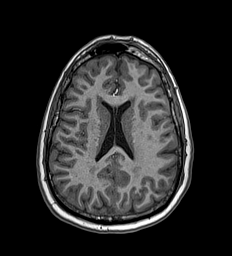
[im 120/160]
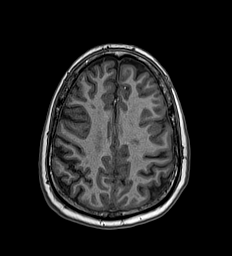
[im 133/160]
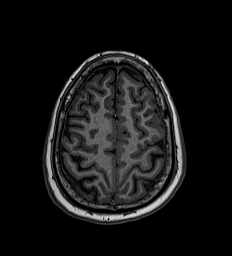
[im 146/160]
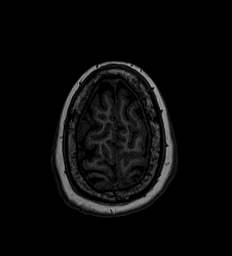
[im 160/160]
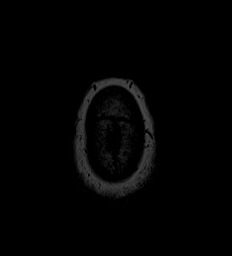

[Series 20: T2 · axial · 5.0mm · 0.45mm/px · z∈[-74,+78]mm · 2 of 27 slices shown (1 of 2)]
[im 1/27]
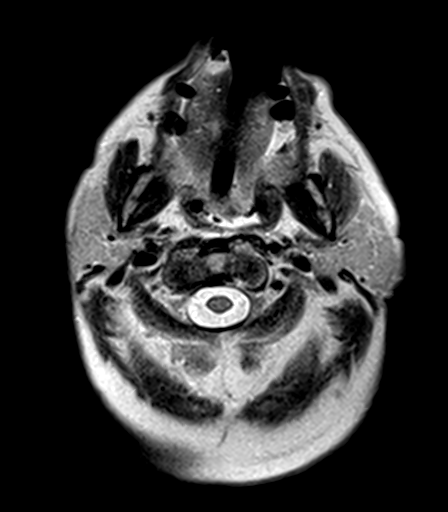
[im 27/27]
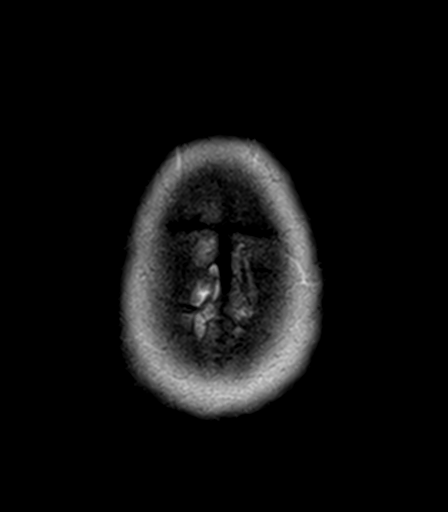

[Series 21: T2 · coronal · 5.0mm · 0.45mm/px · 2 of 29 slices shown (2 of 2)]
[im 1/29]
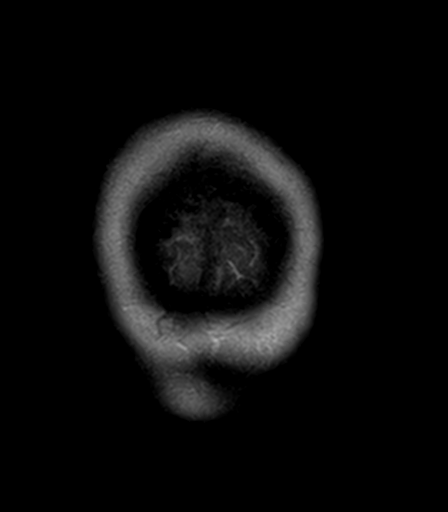
[im 29/29]
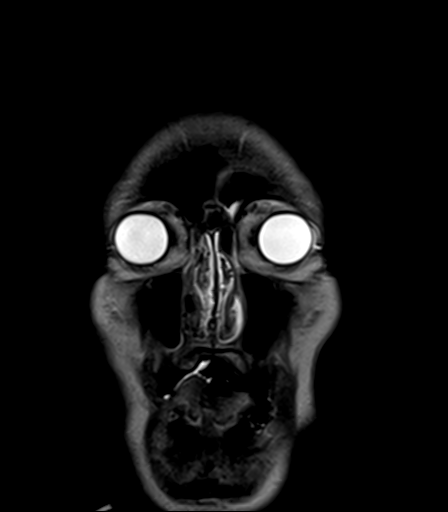

[48 of 48 positions shown; findings below may reference images not displayed]

FINDINGS: Brain: Restricted diffusion with increased T2 signal involving
diffusely the cerebral and cerebellar cortex as well as deep gray
matter and hippocampi consistent with anoxic brain injury. There is
associated swelling with decrease in size of the supratentorial
ventricles compared to prior CT and medially deviation of the uncus
bilaterally. Old right cerebellar hemisphere infarct is noted.
Scattered foci of T2 hyperintensity within the white matter of the
cerebral hemispheres, nonspecific.

Vascular: Normal flow voids.

Skull and upper cervical spine: Normal marrow signal.

Sinuses/Orbits: Fluid level within the ethmoid cells, bilateral
maxillary and sphenoid sinuses. The orbits are maintained.

Other: None.
IMPRESSION: Findings consistent with anoxic brain injury. Decreased size of the
supratentorial ventricles compared with prior CT and medial ization
of the uncus bilaterally.

These results were called by telephone at the time of interpretation
on 02/06/2020 at [DATE] to provider JASMEEN MICHEL , who verbally
acknowledged these results.
